# Patient Record
Sex: Female | Born: 1945 | Race: White | Hispanic: No | Marital: Married | Smoking: Former smoker
Health system: Southern US, Community
[De-identification: ages and names within clinical notes are randomized; demographics above are authoritative.]

## PROBLEM LIST (undated history)

## (undated) DIAGNOSIS — K219 Gastro-esophageal reflux disease without esophagitis: Secondary | ICD-10-CM

## (undated) DIAGNOSIS — F329 Major depressive disorder, single episode, unspecified: Secondary | ICD-10-CM

## (undated) DIAGNOSIS — F32A Depression, unspecified: Secondary | ICD-10-CM

## (undated) DIAGNOSIS — L280 Lichen simplex chronicus: Secondary | ICD-10-CM

## (undated) DIAGNOSIS — I495 Sick sinus syndrome: Secondary | ICD-10-CM

## (undated) DIAGNOSIS — J439 Emphysema, unspecified: Secondary | ICD-10-CM

## (undated) DIAGNOSIS — I080 Rheumatic disorders of both mitral and aortic valves: Secondary | ICD-10-CM

## (undated) DIAGNOSIS — I10 Essential (primary) hypertension: Secondary | ICD-10-CM

## (undated) DIAGNOSIS — R55 Syncope and collapse: Secondary | ICD-10-CM

## (undated) DIAGNOSIS — G8929 Other chronic pain: Secondary | ICD-10-CM

## (undated) DIAGNOSIS — K279 Peptic ulcer, site unspecified, unspecified as acute or chronic, without hemorrhage or perforation: Secondary | ICD-10-CM

## (undated) HISTORY — PX: CARPAL TUNNEL RELEASE: SHX101

## (undated) HISTORY — DX: Sick sinus syndrome: I49.5

## (undated) HISTORY — DX: Syncope and collapse: R55

## (undated) HISTORY — DX: Depression, unspecified: F32.A

## (undated) HISTORY — DX: Gastro-esophageal reflux disease without esophagitis: K21.9

## (undated) HISTORY — DX: Rheumatic disorders of both mitral and aortic valves: I08.0

## (undated) HISTORY — DX: Major depressive disorder, single episode, unspecified: F32.9

## (undated) HISTORY — DX: Lichen simplex chronicus: L28.0

## (undated) HISTORY — DX: Other chronic pain: G89.29

## (undated) HISTORY — DX: Emphysema, unspecified: J43.9

## (undated) HISTORY — PX: SHOULDER SURGERY: SHX246

## (undated) HISTORY — PX: APPENDECTOMY: SHX54

## (undated) HISTORY — PX: PACEMAKER INSERTION: SHX728

## (undated) HISTORY — PX: CHOLECYSTECTOMY: SHX55

## (undated) HISTORY — PX: NISSEN FUNDOPLICATION: SHX2091

## (undated) HISTORY — PX: SPLENECTOMY: SUR1306

## (undated) HISTORY — DX: Peptic ulcer, site unspecified, unspecified as acute or chronic, without hemorrhage or perforation: K27.9

---

## 2004-02-18 ENCOUNTER — Emergency Department (HOSPITAL_COMMUNITY): Admission: EM | Admit: 2004-02-18 | Discharge: 2004-02-18 | Payer: Self-pay | Admitting: Emergency Medicine

## 2004-05-12 ENCOUNTER — Ambulatory Visit: Payer: Self-pay | Admitting: Psychiatry

## 2005-03-03 ENCOUNTER — Ambulatory Visit: Payer: Self-pay | Admitting: Psychiatry

## 2005-03-03 ENCOUNTER — Inpatient Hospital Stay (HOSPITAL_COMMUNITY): Admission: RE | Admit: 2005-03-03 | Discharge: 2005-03-09 | Payer: Self-pay | Admitting: Psychiatry

## 2008-09-20 ENCOUNTER — Encounter: Payer: Self-pay | Admitting: Cardiology

## 2008-12-07 ENCOUNTER — Encounter: Payer: Self-pay | Admitting: Cardiology

## 2009-01-21 ENCOUNTER — Encounter: Payer: Self-pay | Admitting: Cardiology

## 2009-01-22 ENCOUNTER — Ambulatory Visit: Payer: Self-pay | Admitting: Cardiology

## 2009-01-23 ENCOUNTER — Encounter: Payer: Self-pay | Admitting: Cardiology

## 2009-01-31 ENCOUNTER — Telehealth: Payer: Self-pay | Admitting: Cardiology

## 2009-02-07 ENCOUNTER — Telehealth (INDEPENDENT_AMBULATORY_CARE_PROVIDER_SITE_OTHER): Payer: Self-pay | Admitting: Physician Assistant

## 2009-02-25 ENCOUNTER — Ambulatory Visit: Payer: Self-pay | Admitting: Cardiology

## 2009-02-26 ENCOUNTER — Encounter: Payer: Self-pay | Admitting: Cardiology

## 2009-02-28 ENCOUNTER — Ambulatory Visit: Payer: Self-pay | Admitting: Cardiology

## 2009-03-19 ENCOUNTER — Ambulatory Visit: Payer: Self-pay | Admitting: Internal Medicine

## 2009-03-19 DIAGNOSIS — I495 Sick sinus syndrome: Secondary | ICD-10-CM

## 2009-03-19 DIAGNOSIS — R634 Abnormal weight loss: Secondary | ICD-10-CM

## 2009-03-28 ENCOUNTER — Encounter: Payer: Self-pay | Admitting: Internal Medicine

## 2009-03-28 ENCOUNTER — Ambulatory Visit: Payer: Self-pay

## 2009-04-03 ENCOUNTER — Inpatient Hospital Stay (HOSPITAL_COMMUNITY): Admission: RE | Admit: 2009-04-03 | Discharge: 2009-04-04 | Payer: Self-pay | Admitting: Internal Medicine

## 2009-04-03 ENCOUNTER — Ambulatory Visit: Payer: Self-pay | Admitting: Internal Medicine

## 2009-04-03 LAB — CONVERTED CEMR LAB
Basophils Absolute: 0.1 10*3/uL (ref 0.0–0.1)
CO2: 32 meq/L (ref 19–32)
Calcium: 8.7 mg/dL (ref 8.4–10.5)
Creatinine, Ser: 1 mg/dL (ref 0.4–1.2)
Eosinophils Absolute: 0.3 10*3/uL (ref 0.0–0.7)
Glucose, Bld: 102 mg/dL — ABNORMAL HIGH (ref 70–99)
Hemoglobin: 10.5 g/dL — ABNORMAL LOW (ref 12.0–15.0)
Lymphocytes Relative: 34.6 % (ref 12.0–46.0)
Lymphs Abs: 2.2 10*3/uL (ref 0.7–4.0)
MCHC: 33.3 g/dL (ref 30.0–36.0)
Neutro Abs: 2.6 10*3/uL (ref 1.4–7.7)
RDW: 13.5 % (ref 11.5–14.6)
Sodium: 138 meq/L (ref 135–145)
aPTT: 28.8 s (ref 21.7–28.8)

## 2009-04-04 ENCOUNTER — Encounter: Payer: Self-pay | Admitting: Internal Medicine

## 2009-04-18 ENCOUNTER — Ambulatory Visit: Payer: Self-pay | Admitting: Cardiology

## 2009-04-18 DIAGNOSIS — F17200 Nicotine dependence, unspecified, uncomplicated: Secondary | ICD-10-CM

## 2009-04-18 DIAGNOSIS — I38 Endocarditis, valve unspecified: Secondary | ICD-10-CM | POA: Insufficient documentation

## 2009-04-22 ENCOUNTER — Encounter: Payer: Self-pay | Admitting: Internal Medicine

## 2009-04-22 ENCOUNTER — Ambulatory Visit: Payer: Self-pay

## 2009-04-30 ENCOUNTER — Telehealth: Payer: Self-pay | Admitting: Internal Medicine

## 2009-05-02 ENCOUNTER — Encounter: Payer: Self-pay | Admitting: Internal Medicine

## 2009-07-05 ENCOUNTER — Ambulatory Visit: Payer: Self-pay | Admitting: Internal Medicine

## 2010-05-22 ENCOUNTER — Ambulatory Visit: Payer: Self-pay | Admitting: Internal Medicine

## 2010-06-23 ENCOUNTER — Telehealth (INDEPENDENT_AMBULATORY_CARE_PROVIDER_SITE_OTHER): Payer: Self-pay | Admitting: *Deleted

## 2010-06-27 ENCOUNTER — Ambulatory Visit: Payer: Self-pay | Admitting: Cardiology

## 2010-08-21 ENCOUNTER — Encounter: Payer: Self-pay | Admitting: Internal Medicine

## 2010-08-21 ENCOUNTER — Ambulatory Visit
Admission: RE | Admit: 2010-08-21 | Discharge: 2010-08-21 | Payer: Self-pay | Source: Home / Self Care | Attending: Internal Medicine | Admitting: Internal Medicine

## 2010-08-28 NOTE — Assessment & Plan Note (Signed)
Summary: ROV   Visit Type:  Pacemaker check Referring Lestat Golob:  Ival Bible Primary Jeaneen Cala:  Hasanaj   History of Present Illness: The patient presents today for routine electrophysiology followup. She reports doing very well since last being seen in our clinic.  She has had no further syncope and feels that her fatigue has improved s/p pacemaker.  She reports occasional chest tightness and wheezing but continues to smoke.  She also reports occasional sharp fleeting chest pains over her lateral chest wall. The patient denies symptoms of palpitations,  shortness of breath, orthopnea, PND, lower extremity edema, dizziness, presyncope, syncope, or neurologic sequela. The patient is tolerating medications without difficulties and is otherwise without complaint today.   Preventive Screening-Counseling & Management  Alcohol-Tobacco     Smoking Status: current     Smoking Cessation Counseling: yes     Packs/Day: <1/4 PPD  Current Medications (verified): 1)  Xanax 1 Mg Tabs (Alprazolam) .... Three Times A Day 2)  Ranitidine Hcl 300 Mg Caps (Ranitidine Hcl) .... Once Daily 3)  Omeprazole 20 Mg Cpdr (Omeprazole) .... Take 1 Capsule By Mouth Two Times A Day 4)  Morphine Sulfate 15 Mg Tabs (Morphine Sulfate) .... Take 1 Tablet By Mouth Three Times A Day 5)  Promethazine Hcl 25 Mg Tabs (Promethazine Hcl) .... By Mouth As Needed 6)  Zolpidem Tartrate 5 Mg Tabs (Zolpidem Tartrate) .... Take 1 Tab By Mouth At Bedtime  Allergies (verified): No Known Drug Allergies  Comments:  Nurse/Medical Assistant: The patient's medication bottles and allergies were reviewed with the patient and were updated in the Medication and Allergy Lists.  Past History:  Past Medical History: Reviewed history from 03/19/2009 and no changes required. 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    Ischemic testing via Cardiolite was reassuring with ejection fraction   53%, normal wall motion, and no ischemic defects.  Past Surgical History: Reviewed history from 03/19/2009 and no changes required. cholecystectomy GE reflux surgery? Nissen fundoplication Carpal tunnel surgery Shoulder surgery Appendectomy splenectomy following trauma  Social History: Reviewed history from 03/14/2009 and no changes required. Married-has one daughter Tobacco Use - Yes. 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   Vital Signs:  Patient profile:   65 year old female Height:      62 inches Weight:      109 pounds BMI:     20.01 Pulse rate:   85 / minute BP sitting:   101 / 74  (left arm) Cuff size:   regular  Vitals Entered By: Carlye Grippe (May 22, 2010 2:32 PM)  Physical Exam  General:  thin female, appears older than her stated age Head:  normocephalic and atraumatic Eyes:  PERRLA/EOM intact; conjunctiva and lids normal. Mouth:  Teeth, gums and palate normal. Oral mucosa normal. Neck:  supple, no bruits Chest Wall:  pacemaker site is well healed sharp pain with palpation over axillary region and  also at sternochostral borders along L side which reproduces her pain Lungs:  diffuse expiratory wheezes Heart:  RRR, no m/r/g Abdomen:  Bowel sounds positive; abdomen soft and non-tender without masses, organomegaly, or hernias noted. No  hepatosplenomegaly. Msk:  Back normal, normal gait. Muscle strength and tone normal. Extremities:  No clubbing or cyanosis. Neurologic:  Alert and oriented x 3.   PPM Specifications Following MD:  Hillis Range, MD     Referring MD:  MCDOWELL PPM Vendor:  [employer]     PPM Model Number:  [id number]     PPM Serial Number:  [id number] PPM DOI:  04/03/2009     PPM Implanting MD:  Sherryl Manges, MD  Lead 1    Location: RA     DOI: 04/03/2009     Model #: 1944     Serial #: [id number]     Status: active Lead 2    Location: RV     DOI: 04/03/2009     Model #: 1948     Serial #: [id number]     Status: active  Magnet Response Rate:  BOL  100 ERI  85    PPM Follow Up Battery Voltage:  2.98 V     Battery Est. Longevity:  10.7 yrs     Pacer Dependent:  No       PPM Device Measurements Atrium  Amplitude: 5.0 mV, Impedance: 550 ohms, Threshold: 0.5 V at 0.5 msec Right Ventricle  Amplitude: 12.0 mV, Impedance: 660 ohms, Threshold: 0.5 V at 0.4 msec  Episodes MS Episodes:  8     Percent Mode Switch:  <1%     Ventricular High Rate:  0     Atrial Pacing:  56%     Ventricular Pacing:  2.5%  Parameters Mode:  DDDR     Lower Rate Limit:  60     Upper Rate Limit:  120 Paced AV Delay:  200     Sensed AV Delay:  200 Next Remote Date:  08/21/2010     Next Cardiology Appt Due:  04/27/2011 Tech Comments:  8 AMS EPISDOES--LONGEST WAS 8 SECONDS.  NORMAL DEVICE FUNCTION. NO CHANGES MADE. PT TO BE ENROLLED IN MERLIN. [facility]LIN TRANSMISSION 08-21-10 AND ROV IN 12 MTHS W/JA. Vella Kohler  May 22, 2010 3:04 PM MD Comments:  agree  Impression & Recommendations:  Problem # 1:  SINUS BRADYCARDIA (ICD-427.81) normal pacemaker function syncope resolved as above  Problem # 2:  TOBACCO ABUSE (ICD-305.1) cessation advised  Patient Instructions: 1)  merlin phone checks every 3 months 2)  return in 12 months

## 2010-08-28 NOTE — Cardiovascular Report (Signed)
Summary: Card Device Clinic/ [facility] SUMMARY  Card Device Clinic/ [facility] SUMMARY   Imported By: Dorise Hiss 05/23/2010 12:18:00  _____________________________________________________________________  External Attachment:    Type:   Image     Comment:   External Document
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
## 2010-08-28 NOTE — Progress Notes (Signed)
Summary: Home Device Checks   Phone Note Call from Patient Call back at Home Phone [phone]   Summary of Call: Pt left message on stating that she needs someone to call her back about transmitter that is hooked up to phone. Returned call and left message with female to have pt return call.  Initial call taken by: Cyril Loosen, RN, BSN,  June 23, 2010 4:53 PM  Follow-up for Phone Call        Pt has "transmitter" to check her pacemaker on the phone. She would like to know when she is suppose to do this. Pt notified per IDX schedule she is due on 1/26 for phone check. A message will be sent to [facility] in [place] to notify pt if this is incorrect.  Follow-up by: Cyril Loosen, RN, BSN,  June 24, 2010 12:16 PM

## 2010-09-03 ENCOUNTER — Encounter (INDEPENDENT_AMBULATORY_CARE_PROVIDER_SITE_OTHER): Payer: Self-pay | Admitting: *Deleted

## 2010-09-11 NOTE — Cardiovascular Report (Signed)
Summary: Office Visit Remote   Office Visit Remote   Imported By: Roderic Ovens 09/04/2010 15:34:01  _____________________________________________________________________  External Attachment:    Type:   Image     Comment:   External Document

## 2010-09-11 NOTE — Letter (Signed)
Summary: Remote Device Check  [facility], Main Office  [address]   [place], [place] [address]   Phone: [phone]  Fax: [phone]     September 03, 2010 MRN: [id number]   LORELL THIBODAUX [address] [place], [place]  [address]   Dear Ms. Sarabia,   Your remote transmission was recieved and reviewed by your physician.  All diagnostics were within normal limits for you.  __X___Your next transmission is scheduled for:  11-20-2010.  Please transmit at any time this day.  If you have a wireless device your transmission will be sent automatically.   Sincerely,  Vella Kohler

## 2010-11-20 ENCOUNTER — Other Ambulatory Visit: Payer: Self-pay | Admitting: Internal Medicine

## 2010-11-20 ENCOUNTER — Ambulatory Visit (INDEPENDENT_AMBULATORY_CARE_PROVIDER_SITE_OTHER): Payer: Medicare PPO | Admitting: *Deleted

## 2010-11-20 DIAGNOSIS — I495 Sick sinus syndrome: Secondary | ICD-10-CM

## 2010-11-30 NOTE — Progress Notes (Signed)
Pacer remote check  

## 2010-12-03 ENCOUNTER — Encounter: Payer: Self-pay | Admitting: *Deleted
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
## 2010-12-12 NOTE — Discharge Summary (Signed)
NAMEWILL, SCHIER                ACCOUNT NO.:  [id number]   MEDICAL RECORD NO.:  [id number]          PATIENT TYPE:  IPS   LOCATION:  0406                          FACILITY:  [facility]   PHYSICIAN:  Anselm Jungling, MD  DATE OF BIRTH:  02/23/1946   DATE OF ADMISSION:  03/03/2005  DATE OF DISCHARGE:  03/09/2005                                 DISCHARGE SUMMARY   IDENTIFYING DATA AND REASON FOR ADMISSION:  This was the first [facility] admission  for [name], a 65 year old female who was admitted for increasing depression  and suicidal ideation.  Most notably, she had a long history of skin  picking/neurodermatitis.  She was admitted on large doses of Valium,  approximately 10 mg t.i.d.  Medications at the time of admission included  the following:  Effexor XR 75 mg daily, Mobic 15 mg daily, Valium 10 mg  t.i.d., trazodone 50 mg q.h.s., Promethazine 25 mg q.i.d. p.r.n., Skelaxin  800 mg q.i.d. p.r.n., Allegra D 1 b.i.d. p.r.n., quinine sulfate 325 mg  q.h.s., Buspirone 30 mg b.i.d., Sucralfate 1 gram p.o. q.i.d. p.r.n., Maxalt  10 mg daily p.r.n., morphine sulfate 15 mg q.6 hours p.r.n., and Temazepam  30 mg q.h.s. p.r.n.  Please refer to the admission note for further details  pertaining to the symptoms, circumstances, and history that led to his  hospitalization at [facility].   INITIAL DIAGNOSTIC IMPRESSION:  AXIS I:  Depressive disorder not otherwise  specified., rule out psychosis, rule out neurodermatitis, rule out anxiety  disorder not otherwise specified, and benzodiazepine dependence.  AXIS II:  No diagnosis.  AXIS III:  Cellulitis left leg.  AXIS IV:  Stressors, severe.  AXIS V:  Global assessment of function on admission 25.   MEDICAL AND LABORATORY:  The patient presented very ill appearing, with  multiple dressed skin wounds including one on her left anterior lateral leg  with distal swelling of the foot accompanied by significant pain.  She had  been scratching extensively around these  areas.  An internal medicine  consultation was obtained and they provided guidance regarding wound  management throughout her stay.  Also a nutrition consultation was obtained.  Laboratory studies included a CBC that showed reduced RBCs of 3.28, reduced  hemoglobin at 10.6, reduced hematocrit at 31.2, and a routine chemistry  panel that was within normal limits.  TSH was within normal limits as well.  A pregnancy test was negative.  A toxicology screen was negative, although  nordiazepam, oxazepam, and Temazepam were found, along with opiates,  consistent with the medications she had been taking prior to admission.  A  urinalysis was within normal limits.   HOSPITAL COURSE:  The patient was presented as an ill-appearing, nearly  cachectic woman, with multiple dressed skin wounds as above.  She did not  display any overt signs or symptoms of psychosis, but it was felt that there  might be underlying psychotic basis to her overall picture.  Her mood was  depressed and anxious.  Her thoughts were generally well-organized.  She was  generally a poor participant in the treatment program, preferring  to stay in  bed, and being very depressed.   She was placed on a Librium protocol to address her benzodiazepine  dependence.  Librium was tapered over the course of her inpatient stay.   Librium did not appear to be satisfactory, so Valium 5 mg t.i.d. and h.s.  was instituted on a tapering schedule.  Effexor XR was increased to 75 mg  b.i.d.  Risperdal 0.25 mg q.h.s. was initiated in hopes that it would reduce  her tendency to itch and scratch.  Prozac 20 mg daily was instituted in hope  that it may address an obsessive-compulsive quality to her neurodermatitis.  Risperdal was subsequently increased to 0.25 mg b.i.d. and 0.5 mg h.s.  This  was well tolerated.  Keflex 500 mg b.i.d. was given to address skin  infection.  Risperdal was later increased to 0.5 mg b.i.d. and 1 mg q.h.s.  BuSpar 30 mg  b.i.d. was continued.  The patient was given Ensure briefly,  until her appetite improved to the point that she was taking food more  adequately.  Prozac was increased to 40 mg daily.   AFTERCARE PLANS:  At the time of discharge, the patient continued depressed,  but she was much less involved in scratching her skin lesions.  She was  absent suicidal ideation.  [facility]  She was also to follow up with Dr.  Doyne Keel for wound and skin care.   The patient was given instructions for wound care in very specific terms.   DISCHARGE MEDICATIONS:  Trazodone 50 mg q.h.s., Valium 5 mg q.i.d.,  Risperdal 0.5 b.i.d. and 1 mg q.h.s., BuSpar 30 mg b.i.d., Prozac 40 mg  daily, Keflex 500 mg 1 b.i.d. until gone, Mobic 15 mg daily, quinine sulfate  325 mg daily p.r.n. cramping, resume pain medications via Dr. Doyne Keel, and  Benadryl 50 mg q.6 hours p.r.n. itching.   DISCHARGE DIAGNOSES:  AXIS I:  Major depressive disorder, and  neurodermatitis.  AXIS II:  Deferred.  AXIS III:  Multiple skin lesions/wounds,  AXIS IV:  Stressors, severe.  AXIS V:  Global assessment of function on discharge 55.           ______________________________  Anselm Jungling, MD  Electronically Signed     SPB/MEDQ  D:  03/27/2005  T:  03/27/2005  Job:  [id number]
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
## 2011-02-19 ENCOUNTER — Ambulatory Visit (INDEPENDENT_AMBULATORY_CARE_PROVIDER_SITE_OTHER): Payer: Medicare PPO | Admitting: *Deleted

## 2011-02-19 DIAGNOSIS — I498 Other specified cardiac arrhythmias: Secondary | ICD-10-CM

## 2011-02-20 ENCOUNTER — Other Ambulatory Visit: Payer: Self-pay

## 2011-02-20 LAB — REMOTE PACEMAKER DEVICE
AL THRESHOLD: 0.5 V
ATRIAL PACING PM: 57
BAMS-0001: 150 {beats}/min
BAMS-0003: 70 {beats}/min
DEVICE MODEL PM: 2300232
RV LEAD IMPEDENCE PM: 610 Ohm
RV LEAD THRESHOLD: 0.5 V

## 2011-02-24 NOTE — Progress Notes (Signed)
Pacer remote check  

## 2011-03-10 ENCOUNTER — Encounter: Payer: Self-pay | Admitting: *Deleted

## 2011-05-05 ENCOUNTER — Encounter: Payer: Self-pay | Admitting: Internal Medicine

## 2011-05-22 ENCOUNTER — Ambulatory Visit (INDEPENDENT_AMBULATORY_CARE_PROVIDER_SITE_OTHER): Payer: Medicare PPO | Admitting: Internal Medicine

## 2011-05-22 ENCOUNTER — Encounter: Payer: Self-pay | Admitting: Internal Medicine

## 2011-05-22 DIAGNOSIS — I495 Sick sinus syndrome: Secondary | ICD-10-CM

## 2011-05-22 LAB — PACEMAKER DEVICE OBSERVATION
AL AMPLITUDE: 5 mv
AL IMPEDENCE PM: 512.5 Ohm
AL THRESHOLD: 0.5 v
ATRIAL PACING PM: 54
BAMS-0001: 150 {beats}/min
BAMS-0003: 70 {beats}/min
BATTERY VOLTAGE: 2.9779 v
DEVICE MODEL PM: 2300232
RV LEAD AMPLITUDE: 8.6 mv
RV LEAD IMPEDENCE PM: 587.5 Ohm
RV LEAD THRESHOLD: 0.5 v
VENTRICULAR PACING PM: 2.5

## 2011-05-22 NOTE — Progress Notes (Signed)
The patient presents today for routine electrophysiology followup.  Since last being seen in our clinic, the patient reports doing very well.  She has chronic stable chest wall pain.  She fell last week in the bathtub and injured her L wrist.  She denies associated syncope. Today, she denies symptoms of palpitations, exertional chest pain, shortness of breath, orthopnea, PND, lower extremity edema, dizziness, presyncope, syncope, or neurologic sequela.  The patient feels that she is tolerating medications without difficulties and is otherwise without complaint today.   Past Medical History  Diagnosis Date  . Sick sinus syndrome     s/p PPM (SJM)  . Neurodermatitis   . Syncope     history of   . COPD with emphysema   . Depression   . GERD (gastroesophageal reflux disease)   . Peptic ulcer   . Mitral and aortic regurgitation     mild  . Chronic pain    Past Surgical History  Procedure Date  . Cholecystectomy   . Genella Rife   . Other surgical history     gerd surgery, Nissen fundoplication  . Carpal tunnel release   . Shoulder surgery   . Appendectomy   . Splenectomy     following trauma  . Pacemaker insertion     St Jude    Current Outpatient Prescriptions  Medication Sig Dispense Refill  . ALPRAZolam (XANAX) 1 MG tablet Take 1 mg by mouth 3 (three) times daily.        Marland Kitchen gabapentin (NEURONTIN) 300 MG capsule Take 1 capsule by mouth Three times a day.      . morphine (MSIR) 15 MG tablet Take 15 mg by mouth 3 (three) times daily.        Marland Kitchen omeprazole (PRILOSEC) 20 MG capsule Take 1 capsule by mouth Twice daily.      . promethazine (PHENERGAN) 25 MG tablet Take 25 mg by mouth every 6 (six) hours as needed.        . ranitidine (ZANTAC) 300 MG tablet Take 1 tablet by mouth Daily.      Marland Kitchen zolpidem (AMBIEN) 5 MG tablet Take 5 mg by mouth at bedtime as needed.        Marland Kitchen HYDROcodone-acetaminophen (LORTAB) 7.5-500 MG/15ML solution Take 1 tablet by mouth as needed.        No Known  Allergies  History   Social History  . Marital Status: Married    Spouse Name: N/A    Number of Children: 1  . Years of Education: N/A   Occupational History  . Not on file.   Social History Main Topics  . Smoking status: Current Everyday Smoker -- 2.0 packs/day for 40 years    Types: Cigarettes  . Smokeless tobacco: Never Used  . Alcohol Use: No     former heavy drinker  . Drug Use: No     form benodiazepine dependence  . Sexually Active: Not on file   Other Topics Concern  . Not on file   Social History Narrative  . No narrative on file   Physical Exam: Filed Vitals:   05/22/11 1453  BP: 120/73  Pulse: 76  Height: 5\' 2"  (1.575 m)  Weight: 133 lb (60.328 kg)    GEN- The patient is well appearing, alert and oriented x 3 today.   Head- normocephalic, atraumatic Eyes-  Sclera clear, conjunctiva pink Ears- hearing intact Oropharynx- clear Neck- supple, no JVP Lymph- no cervical lymphadenopathy Lungs- Clear to ausculation bilaterally, normal work of  breathing Chest- pacemaker pocket is well healed Heart- Regular rate and rhythm, no murmurs, rubs or gallops, PMI not laterally displaced GI- soft, NT, ND, + BS Extremities- no clubbing, cyanosis, or edema MS- L arm in a cast Skin- no rash or lesion Psych- euthymic mood, full affect Neuro- strength and sensation are intact  Pacemaker interrogation- reviewed in detail today,  See PACEART report  Assessment and Plan:

## 2011-05-22 NOTE — Assessment & Plan Note (Signed)
Normal pacemaker function See Pace Art report No changes today  

## 2011-06-29 ENCOUNTER — Encounter: Payer: Self-pay | Admitting: Cardiology

## 2011-06-30 ENCOUNTER — Encounter: Payer: Self-pay | Admitting: Cardiology

## 2011-06-30 ENCOUNTER — Ambulatory Visit (INDEPENDENT_AMBULATORY_CARE_PROVIDER_SITE_OTHER): Payer: Medicare PPO | Admitting: Cardiology

## 2011-06-30 DIAGNOSIS — I495 Sick sinus syndrome: Secondary | ICD-10-CM

## 2011-06-30 DIAGNOSIS — I359 Nonrheumatic aortic valve disorder, unspecified: Secondary | ICD-10-CM

## 2011-06-30 DIAGNOSIS — I059 Rheumatic mitral valve disease, unspecified: Secondary | ICD-10-CM

## 2011-06-30 NOTE — Progress Notes (Signed)
Clinical Summary [name] is a 65 y.o.female presenting for a one-year visit. She has had interval follow up with Dr. Johney Frame for pacemaker evaluation and history of sick sinus syndrome.  She reports no palpitations, dizziness, or syncope. Has had chronic problems with atypical chest pain and other chronic pain symptoms. No change in intensity or frequency.  Also history of aortic and mitral regurgitation. Reports NYHA class II dyspnea on exertion.   No Known Allergies  Medication list reviewed.  Past Medical History  Diagnosis Date  . Sick sinus syndrome     s/p PPM (SJM)  . Neurodermatitis   . Syncope   . COPD with emphysema   . Depression   . GERD (gastroesophageal reflux disease)   . Peptic ulcer   . Mitral and aortic regurgitation     Mild  . Chronic pain     Past Surgical History  Procedure Date  . Cholecystectomy   . Nissen fundoplication   . Carpal tunnel release   . Shoulder surgery   . Appendectomy   . Splenectomy   . Pacemaker insertion     St Jude    Social History [name] reports that she has been smoking Cigarettes.  She has a 80 pack-year smoking history. She has never used smokeless tobacco. [name] reports that she does not drink alcohol.  Review of Systems Otherwise negative except as outlined.  Physical Examination Filed Vitals:   06/30/11 1434  BP: 120/72  Pulse: 80  Resp: 16   Thin woman in no acute distress.  HEENT: Conjunctiva and lids normal, oropharynx with moist mucosa.  Neck: Supple, no elevated JVP or carotid bruits.  Chest wall: Stable device pocket site on the left.  Cardiac: Regular rate and rhythm, no S3 gallop, is soft apical systolic murmur.  Lungs: Diminished breath sounds throughout, nonlabored, no wheezing.  Extremities: No pitting edema.   ECG Sinus rhythm at 66.   Problem List and Plan

## 2011-06-30 NOTE — Assessment & Plan Note (Signed)
History of aortic and mitral regurgitation, symptomatically stable. Followup 2-D echocardiogram is arranged for objective evaluation.

## 2011-06-30 NOTE — Patient Instructions (Signed)
   Echo  If the results of your test are normal or stable, you will receive a letter.  If they are abnormal, the nurse will contact you by phone. Your physician wants you to follow up in:  1 year.  You will receive a reminder letter in the mail one-two months in advance.  If you don't receive a letter, please call our office to schedule the follow up appointment

## 2011-06-30 NOTE — Assessment & Plan Note (Signed)
Status post pacemaker placement, followed by Dr. Allred. 

## 2011-07-16 ENCOUNTER — Other Ambulatory Visit (INDEPENDENT_AMBULATORY_CARE_PROVIDER_SITE_OTHER): Payer: Medicare PPO | Admitting: *Deleted

## 2011-07-16 DIAGNOSIS — I059 Rheumatic mitral valve disease, unspecified: Secondary | ICD-10-CM

## 2011-07-16 DIAGNOSIS — I359 Nonrheumatic aortic valve disorder, unspecified: Secondary | ICD-10-CM

## 2011-08-20 ENCOUNTER — Encounter: Payer: Medicare PPO | Admitting: *Deleted

## 2011-08-24 ENCOUNTER — Encounter: Payer: Self-pay | Admitting: *Deleted

## 2011-09-25 ENCOUNTER — Telehealth: Payer: Self-pay | Admitting: Internal Medicine

## 2011-09-25 NOTE — Telephone Encounter (Signed)
Pt not in at this time. Will call back on Monday.

## 2011-10-07 NOTE — Telephone Encounter (Signed)
Pt missed remote transmission 08-20-11. Pt to send transmission today and instructed pt that letter would be sent to her letting her know when next transmission date will be scheduled. Pt understands and will send transmission today.

## 2011-10-08 ENCOUNTER — Encounter: Payer: Self-pay | Admitting: Internal Medicine

## 2011-10-08 ENCOUNTER — Encounter (INDEPENDENT_AMBULATORY_CARE_PROVIDER_SITE_OTHER): Payer: Medicare PPO

## 2011-10-08 DIAGNOSIS — I498 Other specified cardiac arrhythmias: Secondary | ICD-10-CM

## 2011-10-09 LAB — REMOTE PACEMAKER DEVICE
BAMS-0001: 150 {beats}/min
BAMS-0003: 70 {beats}/min
BATTERY VOLTAGE: 2.96 V
RV LEAD AMPLITUDE: 10 mv

## 2011-10-13 ENCOUNTER — Encounter: Payer: Self-pay | Admitting: *Deleted

## 2012-01-07 ENCOUNTER — Encounter: Payer: Self-pay | Admitting: Internal Medicine

## 2012-01-07 ENCOUNTER — Ambulatory Visit (INDEPENDENT_AMBULATORY_CARE_PROVIDER_SITE_OTHER): Payer: Medicare Other | Admitting: *Deleted

## 2012-01-07 DIAGNOSIS — I495 Sick sinus syndrome: Secondary | ICD-10-CM

## 2012-01-07 LAB — REMOTE PACEMAKER DEVICE
AL AMPLITUDE: 5 mv
AL IMPEDENCE PM: 490 Ohm
BATTERY VOLTAGE: 2.96 V
RV LEAD IMPEDENCE PM: 680 Ohm
RV LEAD THRESHOLD: 0.625 V
VENTRICULAR PACING PM: 1.4

## 2012-01-19 ENCOUNTER — Encounter: Payer: Self-pay | Admitting: *Deleted

## 2012-04-27 ENCOUNTER — Encounter: Payer: Self-pay | Admitting: *Deleted

## 2012-04-27 DIAGNOSIS — Z950 Presence of cardiac pacemaker: Secondary | ICD-10-CM | POA: Insufficient documentation

## 2012-05-04 ENCOUNTER — Encounter: Payer: Self-pay | Admitting: Internal Medicine

## 2012-05-04 ENCOUNTER — Ambulatory Visit (INDEPENDENT_AMBULATORY_CARE_PROVIDER_SITE_OTHER): Payer: Medicare Other | Admitting: Internal Medicine

## 2012-05-04 VITALS — BP 103/59 | HR 81 | Ht 62.0 in | Wt 132.0 lb

## 2012-05-04 DIAGNOSIS — Z950 Presence of cardiac pacemaker: Secondary | ICD-10-CM

## 2012-05-04 DIAGNOSIS — I495 Sick sinus syndrome: Secondary | ICD-10-CM

## 2012-05-04 DIAGNOSIS — F17200 Nicotine dependence, unspecified, uncomplicated: Secondary | ICD-10-CM

## 2012-05-04 LAB — PACEMAKER DEVICE OBSERVATION
ATRIAL PACING PM: 60
BAMS-0001: 150 {beats}/min
BAMS-0003: 70 {beats}/min
DEVICE MODEL PM: 2300232
RV LEAD THRESHOLD: 0.625 V
VENTRICULAR PACING PM: 2.3

## 2012-05-04 NOTE — Progress Notes (Signed)
PCP: Toma Deiters, MD Primary Cardiologist:  Dr Diona Browner  The patient presents today for routine electrophysiology followup.  Since last being seen in our clinic, the patient reports doing very well.  She has chronic stable chest wall pain which is sharp and under her left breast.  She does not feel that this has changed since her last visit.  Today, she denies symptoms of palpitations, exertional chest pain, shortness of breath, orthopnea, PND, lower extremity edema, dizziness, presyncope, syncope, or neurologic sequela.  The patient feels that she is tolerating medications without difficulties and is otherwise without complaint today.   Past Medical History  Diagnosis Date  . Sick sinus syndrome     s/p PPM (SJM)  . Neurodermatitis   . Syncope   . COPD with emphysema   . Depression   . GERD (gastroesophageal reflux disease)   . Peptic ulcer   . Mitral and aortic regurgitation     Mild  . Chronic pain    Past Surgical History  Procedure Date  . Cholecystectomy   . Nissen fundoplication   . Carpal tunnel release   . Shoulder surgery   . Appendectomy   . Splenectomy   . Pacemaker insertion     St Jude    Current Outpatient Prescriptions  Medication Sig Dispense Refill  . ALPRAZolam (XANAX) 1 MG tablet Take 1 mg by mouth 3 (three) times daily.        Marland Kitchen gabapentin (NEURONTIN) 300 MG capsule Take 1 capsule by mouth Three times a day.      . morphine (MSIR) 15 MG tablet Take 15 mg by mouth 3 (three) times daily.        Marland Kitchen omeprazole (PRILOSEC) 20 MG capsule Take 1 capsule by mouth Twice daily.      . promethazine (PHENERGAN) 25 MG tablet Take 25 mg by mouth every 6 (six) hours as needed.        . ranitidine (ZANTAC) 300 MG tablet Take 1 tablet by mouth Daily.      Marland Kitchen tiZANidine (ZANAFLEX) 4 MG tablet Take 4 mg by mouth 2 (two) times daily.      Marland Kitchen zolpidem (AMBIEN) 5 MG tablet Take 5 mg by mouth at bedtime as needed.          No Known Allergies  History   Social History  .  Marital Status: Married    Spouse Name: N/A    Number of Children: 1  . Years of Education: N/A   Occupational History  . Not on file.   Social History Main Topics  . Smoking status: Current Every Day Smoker -- 2.0 packs/day for 40 years    Types: Cigarettes  . Smokeless tobacco: Never Used   Comment: I have encouraged cessation today, she is not ready to quit  . Alcohol Use: No     Former heavy drinker  . Drug Use: No     Former benodiazepine dependence  . Sexually Active: Not on file   Other Topics Concern  . Not on file   Social History Narrative  . No narrative on file   Physical Exam: Filed Vitals:   05/04/12 1051  BP: 103/59  Pulse: 81  Height: 5\' 2"  (1.575 m)  Weight: 132 lb (59.875 kg)    GEN- The patient is well appearing, alert and oriented x 3 today.   Head- normocephalic, atraumatic Eyes-  Sclera clear, conjunctiva pink Ears- hearing intact Oropharynx- clear Neck- supple, no JVP Lymph- no cervical lymphadenopathy  Lungs- Clear to ausculation bilaterally, normal work of breathing Chest- pacemaker pocket is well healed Heart- Regular rate and rhythm, no murmurs, rubs or gallops, PMI not laterally displaced GI- soft, NT, ND, + BS Extremities- no clubbing, cyanosis, or edema  Pacemaker interrogation- reviewed in detail today,  See PACEART report  Assessment and Plan:  1. Bradycardia Normal pacemaker function See Pace Art report No changes today  2. Atypical chest pain Unchanged  3. Tobacco  Cessation strongly advised She is not ready to quit

## 2012-05-04 NOTE — Patient Instructions (Addendum)
Remote monitoring is used to monitor your Pacemaker of ICD from home. This monitoring reduces the number of office visits required to check your device to one time per year. It allows [place] to keep an eye on the functioning of your device to ensure it is working properly. You are scheduled for a device check from home on August 08, 2012. You may send your transmission at any time that day. If you have a wireless device, the transmission will be sent automatically. After your physician reviews your transmission, you will receive a postcard with your next transmission date.  Your physician wants you to follow-up in: 1 year with Dr Johney Frame.  You will receive a reminder letter in the mail two months in advance. If you don't receive a letter, please call our office to schedule the follow-up appointment.   Your physician recommends that you continue on your current medications as directed. Please refer to the Current Medication list given to you today.

## 2012-07-07 ENCOUNTER — Ambulatory Visit (INDEPENDENT_AMBULATORY_CARE_PROVIDER_SITE_OTHER): Payer: Medicare Other | Admitting: Cardiology

## 2012-07-07 ENCOUNTER — Encounter: Payer: Self-pay | Admitting: Cardiology

## 2012-07-07 VITALS — BP 109/67 | HR 72 | Ht 62.0 in | Wt 131.0 lb

## 2012-07-07 DIAGNOSIS — I38 Endocarditis, valve unspecified: Secondary | ICD-10-CM

## 2012-07-07 DIAGNOSIS — I495 Sick sinus syndrome: Secondary | ICD-10-CM

## 2012-07-07 DIAGNOSIS — F17200 Nicotine dependence, unspecified, uncomplicated: Secondary | ICD-10-CM

## 2012-07-07 NOTE — Assessment & Plan Note (Signed)
History of mitral and aortic regurgitation over time, relatively mild by assessment last year. Tricuspid regurgitation also noted of mild to moderate degree. No change in examination today. A followup ultrasound in one years time.

## 2012-07-07 NOTE — Progress Notes (Signed)
Clinical Summary [name] is a 66 y.o.female presenting for followup. I saw her in December of last year. She had recent interval followup with Dr. Johney Huang in October for device interrogation.  Echocardiogram from December 2012 demonstrated LVEF 55-60% with grade 1 diastolic dysfunction, mild mitral regurgitation, mild to moderate tricuspid regurgitation, no mention of significant aortic regurgitation.  She reports no unusual breathlessness or palpitations, no syncope. Continues to smoke cigarettes and has not been able to quit. Continue to recommend smoking cessation.  ECG today shows normal sinus rhythm.   No Known Allergies  Current Outpatient Prescriptions  Medication Sig Dispense Refill  . ALPRAZolam (XANAX) 1 MG tablet Take 1 mg by mouth 3 (three) times daily.        Marland Kitchen gabapentin (NEURONTIN) 300 MG capsule Take 1 capsule by mouth Three times a day.      . morphine (MSIR) 15 MG tablet Take 15 mg by mouth 3 (three) times daily.        Marland Kitchen omeprazole (PRILOSEC) 20 MG capsule Take 1 capsule by mouth Twice daily.      . promethazine (PHENERGAN) 25 MG tablet Take 25 mg by mouth every 6 (six) hours as needed.        . ranitidine (ZANTAC) 300 MG tablet Take 1 tablet by mouth Daily.      Marland Kitchen tiZANidine (ZANAFLEX) 4 MG tablet Take 4 mg by mouth 2 (two) times daily.      Marland Kitchen zolpidem (AMBIEN) 5 MG tablet Take 5 mg by mouth at bedtime as needed.          Past Medical History  Diagnosis Date  . Sick sinus syndrome     s/p PPM (SJM)  . Neurodermatitis   . Syncope   . COPD with emphysema   . Depression   . GERD (gastroesophageal reflux disease)   . Peptic ulcer   . Mitral and aortic regurgitation     Mild  . Chronic pain     Past Surgical History  Procedure Date  . Cholecystectomy   . Nissen fundoplication   . Carpal tunnel release   . Shoulder surgery   . Appendectomy   . Splenectomy   . Pacemaker insertion     St Jude    Social History [name] reports that she has  been smoking Cigarettes.  She has a 80 pack-year smoking history. She has never used smokeless tobacco. [name] reports that she does not drink alcohol.  Review of Systems Occasional atypical left lateral thoracic discomfort, sporadic without exertional component. No cough, fevers or chills. Otherwise negative.  Physical Examination Filed Vitals:   07/07/12 1006  BP: 109/67  Pulse: 72   Filed Weights   07/07/12 1006  Weight: 131 lb (59.421 kg)    Thin woman in no acute distress.  HEENT: Conjunctiva and lids normal, oropharynx with moist mucosa.  Neck: Supple, no elevated JVP or carotid bruits.  Chest wall: Stable device pocket site on the left.  Cardiac: Regular rate and rhythm, no S3 gallop, is soft apical systolic murmur.  Lungs: Diminished breath sounds throughout, nonlabored, no wheezing.  Extremities: No pitting edema.   Problem List and Plan   VALVULAR HEART DISEASE History of mitral and aortic regurgitation over time, relatively mild by assessment last year. Tricuspid regurgitation also noted of mild to moderate degree. No change in examination today. A followup ultrasound in one years time.  TOBACCO ABUSE Continue recommend smoking cessation.  Sick sinus syndrome Status post St.  Jude pacemaker placement, followed by Dr. Johney Huang.    Jonelle Sidle, M.D., F.A.C.C.

## 2012-07-07 NOTE — Assessment & Plan Note (Signed)
Status post St. Jude pacemaker placement, followed by Dr. Johney Frame.

## 2012-07-07 NOTE — Patient Instructions (Addendum)
Your physician recommends that you schedule a follow-up appointment in: 1 year. You will receive a reminder letter in the mail in about 10 months reminding you to call and schedule your appointment. If you don't receive this letter, please contact our office. Your physician recommends that you continue on your current medications as directed. Please refer to the Current Medication list given to you today. Your physician has requested that you have an echocardiogram in 1 year. Echocardiography is a painless test that uses sound waves to create images of your heart. It provides your doctor with information about the size and shape of your heart and how well your heart's chambers and valves are working. This procedure takes approximately one hour. There are no restrictions for this procedure.  

## 2012-07-07 NOTE — Assessment & Plan Note (Signed)
Continue recommend smoking cessation.

## 2012-08-08 ENCOUNTER — Encounter: Payer: Medicare Other | Admitting: *Deleted

## 2012-08-09 ENCOUNTER — Encounter: Payer: Self-pay | Admitting: *Deleted

## 2013-05-01 ENCOUNTER — Other Ambulatory Visit: Payer: Self-pay | Admitting: *Deleted

## 2013-05-01 DIAGNOSIS — I495 Sick sinus syndrome: Secondary | ICD-10-CM

## 2013-05-01 DIAGNOSIS — I38 Endocarditis, valve unspecified: Secondary | ICD-10-CM

## 2013-05-18 ENCOUNTER — Encounter: Payer: Self-pay | Admitting: Internal Medicine

## 2013-05-18 ENCOUNTER — Ambulatory Visit (INDEPENDENT_AMBULATORY_CARE_PROVIDER_SITE_OTHER): Payer: Medicare Other | Admitting: Internal Medicine

## 2013-05-18 ENCOUNTER — Encounter: Payer: Self-pay | Admitting: Cardiology

## 2013-05-18 VITALS — BP 124/76 | HR 85 | Ht 62.0 in | Wt 153.8 lb

## 2013-05-18 DIAGNOSIS — Z950 Presence of cardiac pacemaker: Secondary | ICD-10-CM

## 2013-05-18 DIAGNOSIS — R079 Chest pain, unspecified: Secondary | ICD-10-CM

## 2013-05-18 DIAGNOSIS — R0602 Shortness of breath: Secondary | ICD-10-CM

## 2013-05-18 DIAGNOSIS — I495 Sick sinus syndrome: Secondary | ICD-10-CM

## 2013-05-18 DIAGNOSIS — F17200 Nicotine dependence, unspecified, uncomplicated: Secondary | ICD-10-CM

## 2013-05-18 NOTE — Progress Notes (Signed)
PCP: Toma Deiters, MD Primary Cardiologist:  Dr Diona Browner  The patient presents today for routine electrophysiology followup.  Since last being seen in our clinic, the patient reports doing reasonably well.  She reports ongoing chronic stable chest wall pain which is sharp and under her left breast. This is more prolonged and worse than previously.  She also reports progressive SOB with exertion. Today, she denies symptoms of palpitations, orthopnea, PND, lower extremity edema, dizziness, presyncope, syncope, or neurologic sequela.  The patient feels that she is tolerating medications without difficulties and is otherwise without complaint today.   Past Medical History  Diagnosis Date  . Sick sinus syndrome     s/p PPM (SJM)  . Neurodermatitis   . Syncope   . COPD with emphysema   . Depression   . GERD (gastroesophageal reflux disease)   . Peptic ulcer   . Mitral and aortic regurgitation     Mild  . Chronic pain    Past Surgical History  Procedure Laterality Date  . Cholecystectomy    . Nissen fundoplication    . Carpal tunnel release    . Shoulder surgery    . Appendectomy    . Splenectomy    . Pacemaker insertion      St Jude    Current Outpatient Prescriptions  Medication Sig Dispense Refill  . ALPRAZolam (XANAX) 1 MG tablet Take 1 mg by mouth 3 (three) times daily.        Marland Kitchen gabapentin (NEURONTIN) 400 MG capsule Take 400 mg by mouth 3 (three) times daily.      Marland Kitchen ipratropium (ATROVENT) 0.02 % nebulizer solution Take 500 mcg by nebulization every 4 (four) hours.      Marland Kitchen morphine (MSIR) 15 MG tablet Take 15 mg by mouth 3 (three) times daily.        Marland Kitchen omeprazole (PRILOSEC) 20 MG capsule Take 1 capsule by mouth Twice daily.      . promethazine (PHENERGAN) 25 MG tablet Take 25 mg by mouth every 6 (six) hours as needed.        . zolpidem (AMBIEN) 5 MG tablet Take 5 mg by mouth at bedtime as needed.        Marland Kitchen tiZANidine (ZANAFLEX) 4 MG tablet Take 4 mg by mouth 2 (two) times daily.        No current facility-administered medications for this visit.    No Known Allergies  History   Social History  . Marital Status: Married    Spouse Name: N/A    Number of Children: 1  . Years of Education: N/A   Occupational History  . Not on file.   Social History Main Topics  . Smoking status: Former Smoker -- 2.00 packs/day for 40 years    Types: Cigarettes    Quit date: 08/18/2012  . Smokeless tobacco: Never Used     Comment: I am pleased that she recently quit.  . Alcohol Use: No     Comment: Former heavy drinker  . Drug Use: No     Comment: Former benodiazepine dependence  . Sexual Activity: Not on file   Other Topics Concern  . Not on file   Social History Narrative  . No narrative on file   Physical Exam: Filed Vitals:   05/18/13 1008  BP: 124/76  Pulse: 85  Height: 5\' 2"  (1.575 m)  Weight: 153 lb 12.8 oz (69.763 kg)    GEN- The patient is well appearing, alert and oriented x 3 today.  Head- normocephalic, atraumatic Eyes-  Sclera clear, conjunctiva pink Ears- hearing intact Oropharynx- clear Neck- supple, no JVP Lymph- no cervical lymphadenopathy Lungs- Clear to ausculation bilaterally, normal work of breathing Chest- pacemaker pocket is well healed Heart- Regular rate and rhythm, no murmurs, rubs or gallops, PMI not laterally displaced GI- soft, NT, ND, + BS Extremities- no clubbing, cyanosis, or edema  Pacemaker interrogation- reviewed in detail today,  See PACEART report  Assessment and Plan:  1. Bradycardia Normal pacemaker function See Pace Art report No changes today  2. Chest pain Both typical and atypical features.  She has not had imaging in several years.  Given progression of symptoms, I think that further CV evaluation is warranted at this time.  I will obtain an echo and lexiscan myoview.  She will  Follow-up with Dr Diona Browner as scheduled in December.  3. Tobacco  She quit 1/14!  I was enthusiastic in celebration with  her over this today.  Return to the device clinic in 1 year Merlin every 3 months

## 2013-05-18 NOTE — Patient Instructions (Signed)
Your physician recommends that you schedule a follow-up appointment in: As scheduled with Dr. Diona Browner and 1 year with Dr. Johney Frame  Your physician recommends that you continue on your current medications as directed. Please refer to the Current Medication list given to you today.  Your physician has requested that you have a lexiscan myoview. For further information please visit [email or web address] Please follow instruction sheet, as given.  Your physician has requested that you have an echocardiogram. Echocardiography is a painless test that uses sound waves to create images of your heart. It provides your doctor with information about the size and shape of your heart and how well your heart's chambers and valves are working. This procedure takes approximately one hour. There are no restrictions for this procedure.

## 2013-05-19 LAB — PACEMAKER DEVICE OBSERVATION
AL AMPLITUDE: 5 mv
AL IMPEDENCE PM: 475 Ohm
BATTERY VOLTAGE: 2.9478 V
RV LEAD AMPLITUDE: 11.7 mv

## 2013-05-24 ENCOUNTER — Other Ambulatory Visit: Payer: Self-pay

## 2013-05-24 ENCOUNTER — Other Ambulatory Visit (INDEPENDENT_AMBULATORY_CARE_PROVIDER_SITE_OTHER): Payer: Medicare Other

## 2013-05-24 DIAGNOSIS — I495 Sick sinus syndrome: Secondary | ICD-10-CM

## 2013-05-24 DIAGNOSIS — R079 Chest pain, unspecified: Secondary | ICD-10-CM

## 2013-05-24 DIAGNOSIS — R0602 Shortness of breath: Secondary | ICD-10-CM

## 2013-05-24 DIAGNOSIS — I38 Endocarditis, valve unspecified: Secondary | ICD-10-CM

## 2013-05-29 ENCOUNTER — Telehealth: Payer: Self-pay | Admitting: *Deleted

## 2013-05-29 NOTE — Telephone Encounter (Signed)
Patient informed. 

## 2013-05-29 NOTE — Telephone Encounter (Signed)
Message copied by Eustace Moore on Mon May 29, 2013  4:48 PM ------      Message from: MCDOWELL, Illene Bolus      Created: Thu May 25, 2013  1:04 PM       This study was ordered by Dr. Johney Frame. LVEF in normal range with grade 1 diastolic dysfunction. No major valvular abnormalities beyond mild aortic regurgitation and mild tricuspid regurgitation. ------

## 2013-05-30 ENCOUNTER — Encounter (HOSPITAL_COMMUNITY): Payer: Self-pay

## 2013-05-30 ENCOUNTER — Encounter (HOSPITAL_COMMUNITY)
Admission: RE | Admit: 2013-05-30 | Discharge: 2013-05-30 | Disposition: A | Discharge status: Home / Self Care | Payer: Medicare Other | Source: Ambulatory Visit | Attending: Internal Medicine | Admitting: Internal Medicine

## 2013-05-30 DIAGNOSIS — R0602 Shortness of breath: Secondary | ICD-10-CM

## 2013-05-30 DIAGNOSIS — R079 Chest pain, unspecified: Secondary | ICD-10-CM

## 2013-05-30 DIAGNOSIS — R0609 Other forms of dyspnea: Secondary | ICD-10-CM | POA: Insufficient documentation

## 2013-05-30 DIAGNOSIS — R0989 Other specified symptoms and signs involving the circulatory and respiratory systems: Secondary | ICD-10-CM | POA: Insufficient documentation

## 2013-05-30 HISTORY — DX: Essential (primary) hypertension: I10

## 2013-05-30 MED ORDER — TECHNETIUM TC 99M SESTAMIBI - CARDIOLITE
10.0000 | Freq: Once | INTRAVENOUS | Status: AC | PRN
  Administered 2013-05-30: 10 via INTRAVENOUS

## 2013-05-30 MED ORDER — TECHNETIUM TC 99M SESTAMIBI - CARDIOLITE
30.0000 | Freq: Once | INTRAVENOUS | Status: AC | PRN
  Administered 2013-05-30: 30 via INTRAVENOUS

## 2013-05-30 MED ORDER — REGADENOSON 0.4 MG/5ML IV SOLN
INTRAVENOUS | Status: AC
  Administered 2013-05-30: 0.4 mg via INTRAVENOUS
  Filled 2013-05-30: qty 5

## 2013-05-30 MED ORDER — SODIUM CHLORIDE 0.9 % IJ SOLN
INTRAMUSCULAR | Status: AC
  Administered 2013-05-30: 10 mL via INTRAVENOUS
  Filled 2013-05-30: qty 10

## 2013-05-30 NOTE — Progress Notes (Signed)
Stress Lab Nurses Notes - [name]  [name] 05/30/2013 Reason for doing test: CAD, Chest Pain and Dyspnea Type of test: [name] Nurse performing test: Parke Poisson, RN Nuclear Medicine Tech: Lyndel Pleasure Echo Tech: Not Applicable MD performing test: Dr.Branch/K.Lawrence NP Family MD: Hasanaj Test explained and consent signed: yes IV started: 22g jelco, Saline lock flushed, No redness or edema and Saline lock started in radiology Symptoms: headache, SOB & Dizziness Treatment/Intervention: None Reason test stopped: protocol completed After recovery IV was: Discontinued via X-ray tech and No redness or edema Patient to return to Nuc. Med at : 12:00 Patient discharged: Home Patient's Condition upon discharge was: stable Comments: During test BP 152/69 & HR 98.  Recovery BP 150/70 & HR 86.  Symptoms resolved in recovery. Erskine Speed T

## 2013-05-31 ENCOUNTER — Encounter: Payer: Self-pay | Admitting: *Deleted

## 2013-06-02 ENCOUNTER — Telehealth: Payer: Self-pay | Admitting: *Deleted

## 2013-06-02 NOTE — Telephone Encounter (Signed)
Patient stated Dr. Camelia Eng is out of the country and she needed her morphine rx. Nurse advised her that our office don't prescribe morphine and she would need to contact whomever is on call for her PCP. Patient verbalized understanding.

## 2013-06-28 ENCOUNTER — Other Ambulatory Visit: Payer: Self-pay

## 2013-07-12 ENCOUNTER — Ambulatory Visit (INDEPENDENT_AMBULATORY_CARE_PROVIDER_SITE_OTHER): Payer: Medicare Other | Admitting: Cardiology

## 2013-07-12 ENCOUNTER — Encounter: Payer: Self-pay | Admitting: Cardiology

## 2013-07-12 VITALS — BP 142/80 | HR 77 | Ht 62.0 in | Wt 153.4 lb

## 2013-07-12 DIAGNOSIS — R079 Chest pain, unspecified: Secondary | ICD-10-CM

## 2013-07-12 DIAGNOSIS — I38 Endocarditis, valve unspecified: Secondary | ICD-10-CM

## 2013-07-12 DIAGNOSIS — I495 Sick sinus syndrome: Secondary | ICD-10-CM

## 2013-07-12 NOTE — Patient Instructions (Signed)
Continue all current medications. Your physician wants you to follow up in:  1 year.  You will receive a reminder letter in the mail one-two months in advance.  If you don't receive a letter, please call our office to schedule the follow up appointment   

## 2013-07-12 NOTE — Progress Notes (Signed)
Clinical Summary [name] is a 67 y.o.female last seen in December 2013. More recently she was seen by Dr. Johney Huang in October of this year. He schedule a followup Lexiscan Cardiolite which was done at [name] and was normal without evidence of ischemia, LVEF 72%.  Echocardiogram from December 2012 demonstrated LVEF 55-60% with grade 1 diastolic dysfunction, mild mitral regurgitation, mild to moderate tricuspid regurgitation, no mention of significant aortic regurgitation. Her valvular heart disease is asymptomatic.  ECG today shows sinus rhythm with nonspecific T-wave changes.  She brought in a medical documentation request from the [name] and I completed the cardiac section as it relates to her diagnosis of sick sinus syndrome with pacemaker placement, followed by Dr. Johney Huang. I explained to her that the rest of the information will be provided by her primary care provider.   No Known Allergies  Current Outpatient Prescriptions  Medication Sig Dispense Refill  . ALPRAZolam (XANAX) 1 MG tablet Take 1 mg by mouth 3 (three) times daily.        Marland Kitchen gabapentin (NEURONTIN) 400 MG capsule Take 400 mg by mouth 3 (three) times daily.      Marland Kitchen ipratropium (ATROVENT) 0.02 % nebulizer solution Take 500 mcg by nebulization every 4 (four) hours.      Marland Kitchen morphine (MSIR) 15 MG tablet Take 15 mg by mouth 3 (three) times daily.        Marland Kitchen omeprazole (PRILOSEC) 20 MG capsule Take 1 capsule by mouth Twice daily.      . promethazine (PHENERGAN) 25 MG tablet Take 25 mg by mouth every 6 (six) hours as needed.        . zolpidem (AMBIEN) 5 MG tablet Take 5 mg by mouth at bedtime as needed.         No current facility-administered medications for this visit.    Past Medical History  Diagnosis Date  . Sick sinus syndrome     s/p PPM (SJM)  . Neurodermatitis   . Syncope   . COPD with emphysema   . Depression   . GERD (gastroesophageal reflux disease)   . Peptic ulcer   . Mitral and aortic regurgitation    Mild  . Chronic pain   . Hypertension     Social History [name] reports that she quit smoking about 10 months ago. Her smoking use included Cigarettes. She has a 80 pack-year smoking history. She has never used smokeless tobacco. [name] reports that she does not drink alcohol.  Review of Systems No palpitations or syncope. No cardiac hospitalizations. No orthopnea or PND. Otherwise negative.  Physical Examination Filed Vitals:   07/12/13 1321  BP: 142/80  Pulse: 77   Filed Weights   07/12/13 1321  Weight: 153 lb 6.4 oz (69.582 kg)    Comfortable at rest. HEENT: Conjunctiva and lids normal, oropharynx with moist mucosa.  Neck: Supple, no elevated JVP or carotid bruits.  Chest wall: Stable device pocket site on the left.  Cardiac: Regular rate and rhythm, no S3 gallop, is soft apical systolic murmur.  Lungs: Diminished breath sounds throughout, nonlabored, no wheezing.  Extremities: No pitting edema. Skin: Warm and dry. Musculoskeletal: No kyphosis. Neuropsychiatric: Alert and oriented x3, affect appropriate.  Problem List and Plan   VALVULAR HEART DISEASE Overall mild and asymptomatic. Examination is stable. No plan for followup echocardiogram at this time.  Sick sinus syndrome Followed by Dr. Johney Huang status post [name]. [name] pacemaker placement. She is on no specific cardiac medications at  this time.  Chest pain No progressive symptoms, documented by Dr. Johney Huang at his last visit in October. Patient had a followup Cardiolite that was negative for ischemia.    Jonelle Sidle, M.D., F.A.C.C.

## 2013-07-12 NOTE — Assessment & Plan Note (Addendum)
Followed by Dr. Johney Frame status post [facility]. Jude pacemaker placement. She is on no specific cardiac medications at this time.

## 2013-07-12 NOTE — Assessment & Plan Note (Addendum)
Overall mild and asymptomatic. Examination is stable. No plan for followup echocardiogram at this time.

## 2013-07-12 NOTE — Assessment & Plan Note (Signed)
No progressive symptoms, documented by Dr. Johney Frame at his last visit in October. Patient had a followup Cardiolite that was negative for ischemia.

## 2013-08-18 ENCOUNTER — Telehealth: Payer: Self-pay | Admitting: Internal Medicine

## 2013-08-18 NOTE — Telephone Encounter (Signed)
LMOVM w/ my direct #. 

## 2013-08-18 NOTE — Telephone Encounter (Signed)
New Prob    Pt is having some difficulty transmitting. Please call.

## 2013-08-21 NOTE — Telephone Encounter (Signed)
Pt going to [facility] 09/06/13. Pt needs cell adapter but has Merlin with non-compatible software version from 2010.

## 2013-09-06 ENCOUNTER — Ambulatory Visit (INDEPENDENT_AMBULATORY_CARE_PROVIDER_SITE_OTHER): Payer: Medicare Other | Admitting: *Deleted

## 2013-09-06 DIAGNOSIS — I495 Sick sinus syndrome: Secondary | ICD-10-CM

## 2013-09-06 LAB — MDC_IDC_ENUM_SESS_TYPE_INCLINIC
Battery Remaining Longevity: 106.8 mo
Battery Voltage: 2.95 V
Implantable Pulse Generator Serial Number: 2300232
Lead Channel Pacing Threshold Amplitude: 0.5 V
Lead Channel Pacing Threshold Amplitude: 0.625 V
Lead Channel Pacing Threshold Pulse Width: 0.4 ms
Lead Channel Setting Pacing Amplitude: 0.875
Lead Channel Setting Pacing Amplitude: 1.5 V
Lead Channel Setting Pacing Pulse Width: 0.4 ms
Lead Channel Setting Sensing Sensitivity: 2 mV
MDC IDC MSMT LEADCHNL RA IMPEDANCE VALUE: 462.5 Ohm
MDC IDC MSMT LEADCHNL RA PACING THRESHOLD PULSEWIDTH: 0.5 ms
MDC IDC MSMT LEADCHNL RA SENSING INTR AMPL: 4.3 mV
MDC IDC MSMT LEADCHNL RV IMPEDANCE VALUE: 587.5 Ohm
MDC IDC MSMT LEADCHNL RV SENSING INTR AMPL: 10.1 mV
MDC IDC SESS DTM: 20150211154713
MDC IDC STAT BRADY RA PERCENT PACED: 43 %
MDC IDC STAT BRADY RV PERCENT PACED: 0.27 %

## 2013-09-06 NOTE — Progress Notes (Signed)
Pacemaker check in clinic. Battery longevity 8.7 years. 11 mode switch episodes--7 minutes 28 seconds. No ventricular high rate episodes. Merlin 12-11-13 and ROV in October with [facility].

## 2013-09-15 ENCOUNTER — Encounter: Payer: Self-pay | Admitting: Internal Medicine

## 2013-12-11 ENCOUNTER — Encounter: Payer: Medicare Other | Admitting: *Deleted

## 2013-12-11 ENCOUNTER — Telehealth: Payer: Self-pay | Admitting: Cardiology

## 2013-12-11 NOTE — Telephone Encounter (Signed)
Spoke with pt and reminded pt of remote transmission that is due today. Pt stated that she could not get monitor to work and that she has already called the [employer] and they are sending her a new monitor. Pt verbalized that she would send transmission when she received new monitor.

## 2013-12-15 ENCOUNTER — Encounter: Payer: Self-pay | Admitting: Cardiology

## 2013-12-15 ENCOUNTER — Encounter: Payer: Self-pay | Admitting: *Deleted

## 2013-12-21 ENCOUNTER — Telehealth: Payer: Self-pay | Admitting: Internal Medicine

## 2013-12-21 LAB — MDC_IDC_ENUM_SESS_TYPE_REMOTE
Battery Remaining Longevity: 107 mo
Brady Statistic AP VP Percent: 1 %
Brady Statistic AP VS Percent: 58 %
Brady Statistic AS VS Percent: 41 %
Brady Statistic RA Percent Paced: 58 %
Brady Statistic RV Percent Paced: 1 %
Implantable Pulse Generator Model: 2110
Implantable Pulse Generator Serial Number: 2300232
Lead Channel Pacing Threshold Amplitude: 0.375 V
Lead Channel Pacing Threshold Amplitude: 0.625 V
Lead Channel Pacing Threshold Pulse Width: 0.4 ms
Lead Channel Pacing Threshold Pulse Width: 0.5 ms
Lead Channel Sensing Intrinsic Amplitude: 11.7 mV
Lead Channel Setting Pacing Amplitude: 1.375
Lead Channel Setting Pacing Pulse Width: 0.4 ms
Lead Channel Setting Sensing Sensitivity: 2 mV
MDC IDC MSMT BATTERY REMAINING PERCENTAGE: 74 %
MDC IDC MSMT BATTERY VOLTAGE: 2.95 V
MDC IDC MSMT LEADCHNL RA IMPEDANCE VALUE: 510 Ohm
MDC IDC MSMT LEADCHNL RA SENSING INTR AMPL: 5 mV
MDC IDC MSMT LEADCHNL RV IMPEDANCE VALUE: 610 Ohm
MDC IDC SESS DTM: 20150528153459
MDC IDC SET LEADCHNL RV PACING AMPLITUDE: 0.875
MDC IDC STAT BRADY AS VP PERCENT: 1 %

## 2013-12-21 NOTE — Telephone Encounter (Signed)
Patient is confused on what she is suppose to return and what she needs to keep

## 2013-12-21 NOTE — Telephone Encounter (Signed)
Spoke w/pt and clarified

## 2013-12-22 ENCOUNTER — Ambulatory Visit (INDEPENDENT_AMBULATORY_CARE_PROVIDER_SITE_OTHER): Payer: Medicare Other | Admitting: *Deleted

## 2013-12-22 ENCOUNTER — Telehealth: Payer: Self-pay | Admitting: Internal Medicine

## 2013-12-22 DIAGNOSIS — I495 Sick sinus syndrome: Secondary | ICD-10-CM

## 2013-12-22 NOTE — Telephone Encounter (Signed)
Follow up     Did we get her remote transmission from yesterday?

## 2013-12-22 NOTE — Telephone Encounter (Signed)
Transmission received, patient aware. 

## 2013-12-22 NOTE — Telephone Encounter (Signed)
Follow up     Call [phone] if no answer on other number

## 2013-12-22 NOTE — Progress Notes (Signed)
Remote pacemaker transmission.   

## 2013-12-22 NOTE — Telephone Encounter (Signed)
New Message:  Pt is calling to get instructions on how to send in her remote transmission

## 2014-01-23 ENCOUNTER — Encounter: Payer: Self-pay | Admitting: Cardiology

## 2014-03-27 ENCOUNTER — Encounter: Payer: Self-pay | Admitting: Internal Medicine

## 2014-05-18 ENCOUNTER — Encounter: Payer: Self-pay | Admitting: Internal Medicine

## 2014-05-18 ENCOUNTER — Ambulatory Visit (INDEPENDENT_AMBULATORY_CARE_PROVIDER_SITE_OTHER): Payer: Medicare Other | Admitting: Internal Medicine

## 2014-05-18 VITALS — BP 124/78 | HR 84 | Ht 62.0 in | Wt 144.0 lb

## 2014-05-18 DIAGNOSIS — I495 Sick sinus syndrome: Secondary | ICD-10-CM | POA: Diagnosis not present

## 2014-05-18 DIAGNOSIS — F17200 Nicotine dependence, unspecified, uncomplicated: Secondary | ICD-10-CM

## 2014-05-18 DIAGNOSIS — R072 Precordial pain: Secondary | ICD-10-CM

## 2014-05-18 DIAGNOSIS — Z720 Tobacco use: Secondary | ICD-10-CM

## 2014-05-18 DIAGNOSIS — Z950 Presence of cardiac pacemaker: Secondary | ICD-10-CM

## 2014-05-18 LAB — MDC_IDC_ENUM_SESS_TYPE_INCLINIC
Brady Statistic RA Percent Paced: 56 %
Brady Statistic RV Percent Paced: 1.9 %
Implantable Pulse Generator Serial Number: 2300232
Lead Channel Impedance Value: 487.5 Ohm
Lead Channel Impedance Value: 775 Ohm
Lead Channel Pacing Threshold Amplitude: 0.5 V
Lead Channel Pacing Threshold Pulse Width: 0.4 ms
Lead Channel Pacing Threshold Pulse Width: 0.5 ms
Lead Channel Setting Pacing Amplitude: 0.75 V
Lead Channel Setting Pacing Amplitude: 1.375
Lead Channel Setting Sensing Sensitivity: 2 mV
MDC IDC MSMT BATTERY REMAINING LONGEVITY: 129.6 mo
MDC IDC MSMT BATTERY VOLTAGE: 2.95 V
MDC IDC MSMT LEADCHNL RA PACING THRESHOLD AMPLITUDE: 0.375 V
MDC IDC MSMT LEADCHNL RA SENSING INTR AMPL: 4.4 mV
MDC IDC MSMT LEADCHNL RV SENSING INTR AMPL: 10.8 mV
MDC IDC SESS DTM: 20151023115416
MDC IDC SET LEADCHNL RV PACING PULSEWIDTH: 0.4 ms

## 2014-05-18 NOTE — Progress Notes (Signed)
PCP: Neale Burly, MD Primary Cardiologist:  Dr Domenic Polite  The patient presents today for routine electrophysiology followup.  Since last being seen in our clinic, the patient reports doing reasonably well.  She reports ongoing chronic stable chest wall pain which is sharp and under her left breast.  This was evaluated by previously with stress testing and echo which were unrevealing.   Today, she denies symptoms of palpitations, orthopnea, PND, lower extremity edema, dizziness, presyncope, syncope, or neurologic sequela.  The patient feels that she is tolerating medications without difficulties and is otherwise without complaint today.   Past Medical History  Diagnosis Date  . Sick sinus syndrome     s/p PPM (SJM)  . Neurodermatitis   . Syncope   . COPD with emphysema   . Depression   . GERD (gastroesophageal reflux disease)   . Peptic ulcer   . Mitral and aortic regurgitation     Mild  . Chronic pain   . Hypertension    Past Surgical History  Procedure Laterality Date  . Cholecystectomy    . Nissen fundoplication    . Carpal tunnel release    . Shoulder surgery    . Appendectomy    . Splenectomy    . Pacemaker insertion      St Jude    Current Outpatient Prescriptions  Medication Sig Dispense Refill  . ALPRAZolam (XANAX) 1 MG tablet Take 1 mg by mouth 3 (three) times daily.        Marland Kitchen gabapentin (NEURONTIN) 400 MG capsule Take 400 mg by mouth 3 (three) times daily.      Marland Kitchen ipratropium (ATROVENT) 0.02 % nebulizer solution Take 500 mcg by nebulization every 4 (four) hours.      Marland Kitchen morphine (MSIR) 15 MG tablet Take 15 mg by mouth 3 (three) times daily.        Marland Kitchen omeprazole (PRILOSEC) 20 MG capsule Take 1 capsule by mouth Twice daily.      . promethazine (PHENERGAN) 25 MG tablet Take 25 mg by mouth every 6 (six) hours as needed.        . zolpidem (AMBIEN) 5 MG tablet Take 5 mg by mouth at bedtime as needed.         No current facility-administered medications for this visit.     No Known Allergies  History   Social History  . Marital Status: Married    Spouse Name: N/A    Number of Children: 1  . Years of Education: N/A   Occupational History  . Not on file.   Social History Main Topics  . Smoking status: Former Smoker -- 2.00 packs/day for 40 years    Types: Cigarettes    Start date: 11/13/1963    Quit date: 08/18/2012  . Smokeless tobacco: Never Used     Comment: I am pleased that she recently quit.  . Alcohol Use: No     Comment: Former heavy drinker  . Drug Use: No     Comment: Former benodiazepine dependence  . Sexual Activity: Not on file   Other Topics Concern  . Not on file   Social History Narrative  . No narrative on file   Physical Exam: Filed Vitals:   05/18/14 1138  BP: 124/78  Pulse: 84  Height: 5\' 2"  (1.575 m)  Weight: 144 lb (65.318 kg)    GEN- The patient is well appearing, alert and oriented x 3 today.   Head- normocephalic, atraumatic Eyes-  Sclera clear, conjunctiva pink Ears-  hearing intact Oropharynx- clear Neck- supple, no JVP Lymph- no cervical lymphadenopathy Lungs- Clear to ausculation bilaterally, normal work of breathing Chest- pacemaker pocket is well healed.  Her L chest wall is diffusely ttp and reproduces her chest wall pain Heart- Regular rate and rhythm, no murmurs, rubs or gallops, PMI not laterally displaced GI- soft, NT, ND, + BS Extremities- no clubbing, cyanosis, or edema  Pacemaker interrogation- reviewed in detail today,  See PACEART report  Assessment and Plan:  1. Sinus radycardia Normal pacemaker function See Pace Art report No changes today  2. Chest pain Due to chest wall pain She says that she is taking morphine for this. I have discouraged use of morphine.  I think that tylenol or ibuprofen would be more appropriate.    3. Tobacco  She remains quit since 1/14!   Return to the device clinic in 1 year Merlin every 3 months

## 2014-05-18 NOTE — Patient Instructions (Signed)
Your physician recommends that you schedule a follow-up appointment in: 1 year with Dr. Rayann Heman. You will receive a reminder letter in the mail in about 10 months reminding you to call and schedule your appointment. If you don't receive this letter, please contact our office. Merlin device check on 08/20/2014. Your physician recommends that you continue on your current medications as directed. Please refer to the Current Medication list given to you today.

## 2014-05-29 ENCOUNTER — Ambulatory Visit: Payer: Medicare Other | Admitting: Cardiology

## 2014-07-16 ENCOUNTER — Ambulatory Visit: Payer: Medicare Other | Admitting: Cardiology

## 2014-08-01 ENCOUNTER — Telehealth: Payer: Self-pay | Admitting: *Deleted

## 2014-08-01 NOTE — Telephone Encounter (Signed)
Message left on nurse's vm to confirm date of next device check.

## 2014-08-01 NOTE — Telephone Encounter (Signed)
Patient informed that next device check is set up on 08/20/14 via voicemail.

## 2014-08-20 ENCOUNTER — Encounter: Payer: Self-pay | Admitting: Internal Medicine

## 2014-08-20 ENCOUNTER — Ambulatory Visit (INDEPENDENT_AMBULATORY_CARE_PROVIDER_SITE_OTHER): Payer: Medicare Other | Admitting: *Deleted

## 2014-08-20 DIAGNOSIS — I495 Sick sinus syndrome: Secondary | ICD-10-CM

## 2014-08-20 LAB — MDC_IDC_ENUM_SESS_TYPE_REMOTE
Battery Remaining Percentage: 67 %
Brady Statistic AP VS Percent: 55 %
Brady Statistic AS VP Percent: 1 %
Brady Statistic AS VS Percent: 44 %
Brady Statistic RA Percent Paced: 55 %
Implantable Pulse Generator Model: 2110
Implantable Pulse Generator Serial Number: 2300232
Lead Channel Impedance Value: 450 Ohm
Lead Channel Impedance Value: 540 Ohm
Lead Channel Pacing Threshold Pulse Width: 0.4 ms
Lead Channel Pacing Threshold Pulse Width: 0.5 ms
Lead Channel Sensing Intrinsic Amplitude: 8.2 mV
Lead Channel Setting Pacing Amplitude: 0.875
Lead Channel Setting Pacing Amplitude: 1.5 V
Lead Channel Setting Sensing Sensitivity: 2 mV
MDC IDC MSMT BATTERY REMAINING LONGEVITY: 95 mo
MDC IDC MSMT BATTERY VOLTAGE: 2.93 V
MDC IDC MSMT LEADCHNL RA PACING THRESHOLD AMPLITUDE: 0.5 V
MDC IDC MSMT LEADCHNL RA SENSING INTR AMPL: 4.2 mV
MDC IDC MSMT LEADCHNL RV PACING THRESHOLD AMPLITUDE: 0.625 V
MDC IDC SESS DTM: 20160125161500
MDC IDC SET LEADCHNL RV PACING PULSEWIDTH: 0.4 ms
MDC IDC STAT BRADY AP VP PERCENT: 1 %
MDC IDC STAT BRADY RV PERCENT PACED: 1 %

## 2014-08-20 NOTE — Progress Notes (Signed)
Remote pacemaker transmission.   

## 2014-08-22 ENCOUNTER — Ambulatory Visit: Payer: Medicare Other | Admitting: Cardiology

## 2014-08-29 ENCOUNTER — Encounter: Payer: Self-pay | Admitting: Cardiology

## 2014-09-17 ENCOUNTER — Ambulatory Visit (INDEPENDENT_AMBULATORY_CARE_PROVIDER_SITE_OTHER): Payer: Medicare Other | Admitting: Cardiology

## 2014-09-17 ENCOUNTER — Encounter: Payer: Self-pay | Admitting: Cardiology

## 2014-09-17 VITALS — BP 132/76 | HR 78 | Ht 62.0 in | Wt 140.8 lb

## 2014-09-17 DIAGNOSIS — I495 Sick sinus syndrome: Secondary | ICD-10-CM

## 2014-09-17 DIAGNOSIS — I38 Endocarditis, valve unspecified: Secondary | ICD-10-CM

## 2014-09-17 DIAGNOSIS — R079 Chest pain, unspecified: Secondary | ICD-10-CM

## 2014-09-17 DIAGNOSIS — I10 Essential (primary) hypertension: Secondary | ICD-10-CM

## 2014-09-17 NOTE — Patient Instructions (Signed)
Your physician wants you to follow-up in: 1 year with Dr. McDowell You will receive a reminder letter in the mail two months in advance. If you don't receive a letter, please call our office to schedule the follow-up appointment.  Your physician recommends that you continue on your current medications as directed. Please refer to the Current Medication list given to you today.  Thank you for choosing [facility]!!   

## 2014-09-17 NOTE — Progress Notes (Signed)
Cardiology Office Note  Date: 09/17/2014   ID: [name], [name] 13-Dec-1945, MRN [id number]  PCP: Neale Burly, MD  Primary Cardiologist: Rozann Lesches, MD   Chief Complaint  Patient presents with  . Sick sinus syndrome  . Valvular heart disease    History of Present Illness: [name] is a 69 y.o. female last seen in December 2014.  Interval follow-up continues with Dr. Rayann Heman for device management. She presents for a routine visit today. States that she has been under a lot of emotional stress recently, had an episode of atypical left-sided thoracic pain a few weeks ago in this setting. No exertional symptoms however. Her most recent ischemic testing is noted below.  She reports NYHA class II dyspnea, no palpitations or syncope. ECG today is reviewed below.   Past Medical History  Diagnosis Date  . Sick sinus syndrome     s/p PPM (SJM)  . Neurodermatitis   . Syncope   . COPD with emphysema   . Depression   . GERD (gastroesophageal reflux disease)   . Peptic ulcer   . Mitral and aortic regurgitation     Mild  . Chronic pain   . Hypertension     Past Surgical History  Procedure Laterality Date  . Cholecystectomy    . Nissen fundoplication    . Carpal tunnel release    . Shoulder surgery    . Appendectomy    . Splenectomy    . Pacemaker insertion      St Jude    Current Outpatient Prescriptions  Medication Sig Dispense Refill  . ALPRAZolam (XANAX) 1 MG tablet Take 1 mg by mouth 3 (three) times daily.      Marland Kitchen gabapentin (NEURONTIN) 400 MG capsule Take 400 mg by mouth 3 (three) times daily.    Marland Kitchen ipratropium (ATROVENT) 0.02 % nebulizer solution Take 500 mcg by nebulization every 4 (four) hours.    Marland Kitchen morphine (MSIR) 15 MG tablet Take 15 mg by mouth 3 (three) times daily.      Marland Kitchen omeprazole (PRILOSEC) 20 MG capsule Take 1 capsule by mouth Twice daily.    Marland Kitchen zolpidem (AMBIEN) 5 MG tablet Take 5 mg by mouth at bedtime as needed.       No current  facility-administered medications for this visit.    Allergies:  Review of patient's allergies indicates no known allergies.   Social History: The patient  reports that she quit smoking about 2 years ago. Her smoking use included Cigarettes. She started smoking about 50 years ago. She has a 80 pack-year smoking history. She has never used smokeless tobacco. She reports that she does not drink alcohol or use illicit drugs.   ROS:  Please see the history of present illness. Otherwise, complete review of systems is positive for none.  All other systems are reviewed and negative.    Physical Exam: VS:  BP 132/76 mmHg  Pulse 78  Ht 5\' 2"  (1.575 m)  Wt 140 lb 12.8 oz (63.866 kg)  BMI 25.75 kg/m2  SpO2 95%, BMI Body mass index is 25.75 kg/(m^2).  Wt Readings from Last 3 Encounters:  09/17/14 140 lb 12.8 oz (63.866 kg)  05/18/14 144 lb (65.318 kg)  07/12/13 153 lb 6.4 oz (69.582 kg)     Comfortable at rest. HEENT: Conjunctiva and lids normal, oropharynx with moist mucosa.  Neck: Supple, no elevated JVP or carotid bruits.  Chest wall: Stable device pocket site on the left.  Cardiac: Regular rate and  rhythm, no S3 gallop, is soft apical systolic murmur.  Lungs: Diminished breath sounds throughout, nonlabored, no wheezing.  Extremities: No pitting edema.   ECG: ECG is ordered today and reviewed showing sinus rhythm with low voltage.   Other Studies Reviewed Today:  1. Cardiolite 05/30/2013: Lexiscan Sestamibi: Normal perfusion. LVEF was calculated at 72%.  Please s identification: Patient is a 69 year old with chest pain tests to evaluate, rule out ischemia.  Stress data: The patient underwent stress testing withLexiscan per protocol. Baseline EKG showed sinus rhythm 61 beats per min. Baseline blood pressure 121/63.  With the infusion of Lexiscan the patient developed no significant ST changes (less than 1 mm ST changes). She experienced no chest pain. Overall  electrically negative.  Nuclear data: The patient was studied in a 1 day rest stress protocol. Patient was injected with 10 mCi technetium 99 labeled sestamibi at rest, 30 mCi technetium 99 labeled sestamibi at stress. Images were reconstructed in short, vertical, horizontal axes.  In the initial stress images there was normal perfusion. In the recovery images there was normal perfusion.  On gating, LV EF was calculated at 72% with normal wall motion.  2. Echocardiogram 05/24/2013: Study Conclusions  - Left ventricle: The cavity size was normal. Wall thickness was normal. Systolic function was normal. The estimated ejection fraction was in the range of 60% to 65%. Wall motion was normal; there were no regional wall motion abnormalities. There was an increased relative contribution of atrial contraction to ventricular filling. Doppler parameters are consistent with abnormal left ventricular relaxation (grade 1 diastolic dysfunction). - Ventricular septum: Septal motion showed paradox. These changes are consistent with right ventricular pacing. - Aortic valve: Trileaflet; mildly thickened leaflets. Mild regurgitation. Valve area: 1.96cm^2(VTI). Valve area: 1.86cm^2 (Vmax). - Right ventricle: The cavity size was mildly dilated. Wall thickness was normal. Pacer wire or catheter noted in right ventricle. Systolic function was normal. - Right atrium: The atrium was mildly dilated. Pacer wire or catheter noted in right atrium. - Tricuspid valve: Mild regurgitation.   Assessment and Plan:  1. Sick sinus syndrome status post St. Jude pacemaker placement, followed by Dr. Rayann Heman.  2. Valvular heart disease, mild mitral and aortic regurgitation as noted. Asymptomatic, no change on examination. No clear indication for follow-up echocardiogram at this time.  3. Essential hypertension, currently managed by diet. Keep follow-up with Dr. Sherrie Sport.  Current  medicines are reviewed at length with the patient today.  The patient does not have concerns regarding medicines.  Orders Placed This Encounter  Procedures  . EKG 12-Lead    Disposition: FU with me in 1 year.   Signed, Satira Sark, MD, [facility] 09/17/2014 4:20 PM    [place] [facility] [place], [place], [place] [address] Phone: [phone]; Fax: [phone]

## 2014-11-20 ENCOUNTER — Ambulatory Visit (INDEPENDENT_AMBULATORY_CARE_PROVIDER_SITE_OTHER): Payer: Medicare Other | Admitting: *Deleted

## 2014-11-20 ENCOUNTER — Encounter: Payer: Self-pay | Admitting: Internal Medicine

## 2014-11-20 DIAGNOSIS — I495 Sick sinus syndrome: Secondary | ICD-10-CM | POA: Diagnosis not present

## 2014-11-20 LAB — MDC_IDC_ENUM_SESS_TYPE_REMOTE
Battery Remaining Longevity: 95 mo
Brady Statistic AS VS Percent: 48 %
Brady Statistic RA Percent Paced: 52 %
Date Time Interrogation Session: 20160426131621
Implantable Pulse Generator Model: 2110
Implantable Pulse Generator Serial Number: 2300232
Lead Channel Impedance Value: 460 Ohm
Lead Channel Pacing Threshold Amplitude: 0.5 V
Lead Channel Pacing Threshold Amplitude: 0.625 V
Lead Channel Pacing Threshold Pulse Width: 0.4 ms
Lead Channel Pacing Threshold Pulse Width: 0.5 ms
Lead Channel Sensing Intrinsic Amplitude: 5 mV
Lead Channel Sensing Intrinsic Amplitude: 9.9 mV
Lead Channel Setting Pacing Amplitude: 0.875
Lead Channel Setting Pacing Amplitude: 1.5 V
Lead Channel Setting Sensing Sensitivity: 2 mV
MDC IDC MSMT BATTERY REMAINING PERCENTAGE: 67 %
MDC IDC MSMT BATTERY VOLTAGE: 2.93 V
MDC IDC MSMT LEADCHNL RV IMPEDANCE VALUE: 590 Ohm
MDC IDC SET LEADCHNL RV PACING PULSEWIDTH: 0.4 ms
MDC IDC STAT BRADY AP VP PERCENT: 1 %
MDC IDC STAT BRADY AP VS PERCENT: 52 %
MDC IDC STAT BRADY AS VP PERCENT: 1 %
MDC IDC STAT BRADY RV PERCENT PACED: 1 %

## 2014-11-20 NOTE — Progress Notes (Signed)
Remote pacemaker transmission.   

## 2014-11-27 ENCOUNTER — Encounter: Payer: Self-pay | Admitting: Cardiology

## 2015-02-19 ENCOUNTER — Encounter: Payer: Medicare Other | Admitting: *Deleted

## 2015-03-06 ENCOUNTER — Encounter: Payer: Self-pay | Admitting: *Deleted

## 2015-03-14 ENCOUNTER — Encounter: Payer: Self-pay | Admitting: Internal Medicine

## 2015-03-14 ENCOUNTER — Ambulatory Visit (INDEPENDENT_AMBULATORY_CARE_PROVIDER_SITE_OTHER): Payer: Medicare Other | Admitting: *Deleted

## 2015-03-14 DIAGNOSIS — I495 Sick sinus syndrome: Secondary | ICD-10-CM

## 2015-03-19 NOTE — Progress Notes (Signed)
Remote pacemaker transmission.   

## 2015-03-26 LAB — CUP PACEART REMOTE DEVICE CHECK
Battery Remaining Percentage: 81 %
Brady Statistic AP VP Percent: 2.2 %
Brady Statistic AP VS Percent: 54 %
Brady Statistic RA Percent Paced: 55 %
Brady Statistic RV Percent Paced: 2.2 %
Date Time Interrogation Session: 20160818130113
Lead Channel Impedance Value: 490 Ohm
Lead Channel Impedance Value: 740 Ohm
Lead Channel Pacing Threshold Amplitude: 0.5 V
Lead Channel Pacing Threshold Amplitude: 0.5 V
Lead Channel Pacing Threshold Pulse Width: 0.4 ms
Lead Channel Pacing Threshold Pulse Width: 0.5 ms
Lead Channel Sensing Intrinsic Amplitude: 4.2 mV
Lead Channel Setting Pacing Amplitude: 1.5 V
Lead Channel Setting Pacing Pulse Width: 0.4 ms
Lead Channel Setting Sensing Sensitivity: 2 mV
MDC IDC MSMT BATTERY REMAINING LONGEVITY: 115 mo
MDC IDC MSMT BATTERY VOLTAGE: 2.93 V
MDC IDC MSMT LEADCHNL RV SENSING INTR AMPL: 11.3 mV
MDC IDC SET LEADCHNL RV PACING AMPLITUDE: 0.75 V
MDC IDC STAT BRADY AS VP PERCENT: 1 %
MDC IDC STAT BRADY AS VS PERCENT: 44 %
Pulse Gen Serial Number: 2300232

## 2015-03-29 ENCOUNTER — Encounter: Payer: Self-pay | Admitting: Cardiology

## 2015-05-03 ENCOUNTER — Encounter: Payer: Self-pay | Admitting: Internal Medicine

## 2015-05-03 ENCOUNTER — Ambulatory Visit (INDEPENDENT_AMBULATORY_CARE_PROVIDER_SITE_OTHER): Payer: Medicare Other | Admitting: Internal Medicine

## 2015-05-03 VITALS — BP 123/74 | HR 77 | Ht 62.0 in | Wt 137.1 lb

## 2015-05-03 DIAGNOSIS — I495 Sick sinus syndrome: Secondary | ICD-10-CM

## 2015-05-03 DIAGNOSIS — R079 Chest pain, unspecified: Secondary | ICD-10-CM

## 2015-05-03 DIAGNOSIS — Z950 Presence of cardiac pacemaker: Secondary | ICD-10-CM

## 2015-05-03 LAB — CUP PACEART INCLINIC DEVICE CHECK
Battery Voltage: 2.93 V
Brady Statistic RA Percent Paced: 57 %
Brady Statistic RV Percent Paced: 2 %
Lead Channel Impedance Value: 450 Ohm
Lead Channel Impedance Value: 750 Ohm
Lead Channel Pacing Threshold Pulse Width: 0.4 ms
Lead Channel Pacing Threshold Pulse Width: 0.5 ms
Lead Channel Sensing Intrinsic Amplitude: 10.9 mV
Lead Channel Sensing Intrinsic Amplitude: 4.2 mV
Lead Channel Setting Pacing Amplitude: 1.375
MDC IDC MSMT BATTERY REMAINING LONGEVITY: 115.2 mo
MDC IDC MSMT LEADCHNL RA PACING THRESHOLD AMPLITUDE: 0.375 V
MDC IDC MSMT LEADCHNL RV PACING THRESHOLD AMPLITUDE: 0.625 V
MDC IDC SESS DTM: 20161007120408
MDC IDC SET LEADCHNL RV PACING AMPLITUDE: 0.875
MDC IDC SET LEADCHNL RV PACING PULSEWIDTH: 0.4 ms
MDC IDC SET LEADCHNL RV SENSING SENSITIVITY: 2 mV
Pulse Gen Model: 2110
Pulse Gen Serial Number: 2300232

## 2015-05-03 NOTE — Progress Notes (Signed)
PCP: Neale Burly, MD Primary Cardiologist:  Dr Domenic Polite  The patient presents today for routine electrophysiology followup.  Since last being seen in our clinic, the patient reports doing reasonably well.  She reports ongoing chronic stable chest wall pain which is sharp and under her left breast.  This was evaluated by previously with stress testing and echo which were unrevealing.   Today, she denies symptoms of palpitations, orthopnea, PND, lower extremity edema, dizziness, presyncope, syncope, or neurologic sequela.  The patient feels that she is tolerating medications without difficulties and is otherwise without complaint today.   Past Medical History  Diagnosis Date  . Sick sinus syndrome (HCC)     s/p PPM (SJM)  . Neurodermatitis   . Syncope   . COPD with emphysema ([place])   . Depression   . GERD (gastroesophageal reflux disease)   . Peptic ulcer   . Mitral and aortic regurgitation     Mild  . Chronic pain   . Hypertension    Past Surgical History  Procedure Laterality Date  . Cholecystectomy    . Nissen fundoplication    . Carpal tunnel release    . Shoulder surgery    . Appendectomy    . Splenectomy    . Pacemaker insertion      St Jude    Current Outpatient Prescriptions  Medication Sig Dispense Refill  . ALPRAZolam (XANAX) 1 MG tablet Take 1 mg by mouth 3 (three) times daily.      . baclofen (LIORESAL) 10 MG tablet Take 10 mg by mouth 2 (two) times daily as needed for muscle spasms.    Marland Kitchen gabapentin (NEURONTIN) 400 MG capsule Take 400 mg by mouth 3 (three) times daily.    Marland Kitchen ipratropium (ATROVENT) 0.02 % nebulizer solution Take 500 mcg by nebulization every 4 (four) hours.    Marland Kitchen morphine (MSIR) 15 MG tablet Take 15 mg by mouth 3 (three) times daily.      Marland Kitchen omeprazole (PRILOSEC) 20 MG capsule Take 1 capsule by mouth Twice daily.    . propranolol (INDERAL) 10 MG tablet Take 10 mg by mouth 2 (two) times daily.    . ranitidine (ZANTAC) 300 MG tablet Take 300 mg by mouth  at bedtime.    . traZODone (DESYREL) 50 MG tablet Take 25 mg by mouth at bedtime as needed for sleep.     No current facility-administered medications for this visit.    No Known Allergies  Social History   Social History  . Marital Status: Married    Spouse Name: N/A  . Number of Children: 1  . Years of Education: N/A   Occupational History  . Not on file.   Social History Main Topics  . Smoking status: Former Smoker -- 2.00 packs/day for 40 years    Types: Cigarettes    Start date: 11/13/1963    Quit date: 08/18/2012  . Smokeless tobacco: Never Used     Comment: I am pleased that she recently quit.  . Alcohol Use: No     Comment: Former heavy drinker  . Drug Use: No     Comment: Former benodiazepine dependence  . Sexual Activity: Not on file   Other Topics Concern  . Not on file   Social History Narrative   Physical Exam: Filed Vitals:   05/03/15 1046  BP: 123/74  Pulse: 77  Height: 5\' 2"  (1.575 m)  Weight: 137 lb 1.9 oz (62.197 kg)  SpO2: 95%    GEN-  The patient is well appearing, alert and oriented x 3 today.   Head- normocephalic, atraumatic Eyes-  Sclera clear, conjunctiva pink Ears- hearing intact Oropharynx- clear Neck- supple,   Lungs- Clear to ausculation bilaterally, normal work of breathing Chest- pacemaker pocket is well healed.  Her L chest wal (axilla) is diffusely ttp and reproduces her chest wall pain Heart- Regular rate and rhythm, no murmurs, rubs or gallops, PMI not laterally displaced GI- soft, NT, ND, + BS Extremities- no clubbing, cyanosis, or edema  Pacemaker interrogation- reviewed in detail today,  See PACEART report ekg reveals atrial pacing, no ischemic changes  Assessment and Plan:  1. Sinus bradycardia Normal pacemaker function See Pace Art report No changes today  2. Chest pain Due to chest wall pain ekg today reveals atrial pacing, no ischemic changes  3. Tobacco  She remains quit since 1/14!   Return to the  device clinic in 1 year Merlin every 3 months

## 2015-05-03 NOTE — Patient Instructions (Addendum)
Your physician recommends that you continue on your current medications as directed. Please refer to the Current Medication list given to you today. Device check 08/05/15. Your physician recommends that you schedule a follow-up appointment in: 1 year with Dr. Rayann Heman. You will receive a reminder letter in the mail in about 10 months reminding you to call and schedule your appointment. If you don't receive this letter, please contact our office. You may also schedule this appointment today.

## 2015-05-10 ENCOUNTER — Encounter: Payer: Medicare Other | Admitting: Internal Medicine

## 2015-05-17 ENCOUNTER — Encounter: Payer: Self-pay | Admitting: Internal Medicine

## 2015-06-13 ENCOUNTER — Other Ambulatory Visit: Payer: Self-pay | Admitting: Pain Medicine

## 2015-06-13 DIAGNOSIS — M545 Low back pain: Secondary | ICD-10-CM

## 2015-08-05 ENCOUNTER — Ambulatory Visit (INDEPENDENT_AMBULATORY_CARE_PROVIDER_SITE_OTHER): Payer: PPO | Admitting: *Deleted

## 2015-08-05 DIAGNOSIS — I495 Sick sinus syndrome: Secondary | ICD-10-CM | POA: Diagnosis not present

## 2015-08-06 NOTE — Progress Notes (Signed)
Remote pacemaker transmission.   

## 2015-08-25 LAB — CUP PACEART REMOTE DEVICE CHECK
Brady Statistic AP VP Percent: 2 %
Brady Statistic AP VS Percent: 52 %
Brady Statistic AS VP Percent: 1 %
Brady Statistic AS VS Percent: 46 %
Brady Statistic RA Percent Paced: 53 %
Brady Statistic RV Percent Paced: 2.1 %
Date Time Interrogation Session: 20170109165521
Implantable Lead Implant Date: 20100908
Implantable Lead Implant Date: 20100908
Implantable Lead Location: 753860
Implantable Lead Model: 1948
Lead Channel Impedance Value: 540 Ohm
Lead Channel Pacing Threshold Amplitude: 0.625 V
Lead Channel Pacing Threshold Pulse Width: 0.4 ms
Lead Channel Pacing Threshold Pulse Width: 0.5 ms
Lead Channel Sensing Intrinsic Amplitude: 5 mV
Lead Channel Sensing Intrinsic Amplitude: 9.9 mV
Lead Channel Setting Pacing Amplitude: 1.375
Lead Channel Setting Pacing Pulse Width: 0.4 ms
Lead Channel Setting Sensing Sensitivity: 2 mV
MDC IDC LEAD LOCATION: 753859
MDC IDC LEAD MODEL: 1944
MDC IDC MSMT BATTERY REMAINING LONGEVITY: 113 mo
MDC IDC MSMT BATTERY REMAINING PERCENTAGE: 81 %
MDC IDC MSMT BATTERY VOLTAGE: 2.93 V
MDC IDC MSMT LEADCHNL RA IMPEDANCE VALUE: 440 Ohm
MDC IDC MSMT LEADCHNL RA PACING THRESHOLD AMPLITUDE: 0.375 V
MDC IDC SET LEADCHNL RV PACING AMPLITUDE: 0.875
Pulse Gen Model: 2110
Pulse Gen Serial Number: 2300232

## 2015-08-28 ENCOUNTER — Encounter: Payer: Self-pay | Admitting: Cardiology

## 2015-09-17 DIAGNOSIS — Z6826 Body mass index (BMI) 26.0-26.9, adult: Secondary | ICD-10-CM | POA: Diagnosis not present

## 2015-09-17 DIAGNOSIS — M545 Low back pain: Secondary | ICD-10-CM | POA: Diagnosis not present

## 2015-09-17 DIAGNOSIS — J440 Chronic obstructive pulmonary disease with acute lower respiratory infection: Secondary | ICD-10-CM | POA: Diagnosis not present

## 2015-10-11 ENCOUNTER — Ambulatory Visit (INDEPENDENT_AMBULATORY_CARE_PROVIDER_SITE_OTHER): Payer: PPO | Admitting: Cardiology

## 2015-10-11 ENCOUNTER — Encounter: Payer: Self-pay | Admitting: Cardiology

## 2015-10-11 VITALS — BP 112/60 | HR 80 | Ht 62.0 in | Wt 132.0 lb

## 2015-10-11 DIAGNOSIS — J449 Chronic obstructive pulmonary disease, unspecified: Secondary | ICD-10-CM | POA: Diagnosis not present

## 2015-10-11 DIAGNOSIS — I495 Sick sinus syndrome: Secondary | ICD-10-CM

## 2015-10-11 DIAGNOSIS — I10 Essential (primary) hypertension: Secondary | ICD-10-CM | POA: Diagnosis not present

## 2015-10-11 DIAGNOSIS — I38 Endocarditis, valve unspecified: Secondary | ICD-10-CM

## 2015-10-11 NOTE — Progress Notes (Signed)
Cardiology Office Note  Date: 10/11/2015   ID: Daniya Hermance, DOB 11/02/1945, MRN [id number]  PCP: Neale Burly, MD  Primary Cardiologist: Rozann Lesches, MD   Chief Complaint  Patient presents with  . Cardiac follow-up    History of Present Illness: Alyssah Ware is a 70 y.o. female last seen in February 2016. She presents for a routine follow-up visit. She has a history of mild aortic and mitral valve disease as outlined below, has been asymptomatic. She reports no recurrent exertional chest pain or dyspnea on exertion beyond NYHA class II. Her last echocardiogram was in 2014.  She continues to follow with Dr. Rayann Heman in the device clinic. Most recent remote device check showed normal function. She has a history of sick sinus syndrome. No dizziness or syncope.  She follows regularly with Dr. Sherrie Sport.  Past Medical History  Diagnosis Date  . Sick sinus syndrome (HCC)     s/p PPM (SJM)  . Neurodermatitis   . Syncope   . COPD with emphysema ([place])   . Depression   . GERD (gastroesophageal reflux disease)   . Peptic ulcer   . Mitral and aortic regurgitation     Mild  . Chronic pain   . Hypertension     Current Outpatient Prescriptions  Medication Sig Dispense Refill  . ALPRAZolam (XANAX) 1 MG tablet Take 1 mg by mouth 3 (three) times daily.      . baclofen (LIORESAL) 10 MG tablet Take 10 mg by mouth 2 (two) times daily as needed for muscle spasms.    Marland Kitchen gabapentin (NEURONTIN) 400 MG capsule Take 400 mg by mouth 3 (three) times daily.    Marland Kitchen ipratropium (ATROVENT) 0.02 % nebulizer solution Take 500 mcg by nebulization every 4 (four) hours.    Marland Kitchen morphine (MSIR) 15 MG tablet Take 15 mg by mouth 3 (three) times daily.      Marland Kitchen omeprazole (PRILOSEC) 20 MG capsule Take 1 capsule by mouth Twice daily.    . ranitidine (ZANTAC) 300 MG tablet Take 300 mg by mouth at bedtime.    . traZODone (DESYREL) 50 MG tablet Take 25 mg by mouth at bedtime as needed for sleep.     No current  facility-administered medications for this visit.   Allergies:  Review of patient's allergies indicates no known allergies.   Social History: The patient  reports that she quit smoking about 3 years ago. Her smoking use included Cigarettes. She started smoking about 51 years ago. She has a 80 pack-year smoking history. She has never used smokeless tobacco. She reports that she does not drink alcohol or use illicit drugs.   ROS:  Please see the history of present illness. Otherwise, complete review of systems is positive for reflux.  All other systems are reviewed and negative.   Physical Exam: VS:  BP 112/60 mmHg  Pulse 80  Ht 5\' 2"  (1.575 m)  Wt 132 lb (59.875 kg)  BMI 24.14 kg/m2  SpO2 91%, BMI Body mass index is 24.14 kg/(m^2).  Wt Readings from Last 3 Encounters:  10/11/15 132 lb (59.875 kg)  05/03/15 137 lb 1.9 oz (62.197 kg)  09/17/14 140 lb 12.8 oz (63.866 kg)    Comfortable at rest. HEENT: Conjunctiva and lids normal, oropharynx with moist mucosa.  Neck: Supple, no elevated JVP or carotid bruits.  Chest wall: Stable device pocket site on the left.  Cardiac: Regular rate and rhythm, no S3 gallop, is soft apical systolic murmur.  Lungs: Diminished breath  sounds throughout, nonlabored, no wheezing.  Extremities: No pitting edema.  ECG: I personally reviewed the prior tracing from 05/03/2015 which showed an atrial paced rhythm with nonspecific T-wave changes.  Other Studies Reviewed Today:  Cardiolite 05/30/2013: Lexiscan Sestamibi: Normal perfusion. LVEF was calculated at 72%.  Please s identification: Patient is a 70 year old with chest pain tests to evaluate, rule out ischemia.  Stress data: The patient underwent stress testing withLexiscan per protocol. Baseline EKG showed sinus rhythm 61 beats per min. Baseline blood pressure 121/63.  With the infusion of Lexiscan the patient developed no significant ST changes (less than 1 mm ST changes). She experienced  no chest pain. Overall electrically negative.  Nuclear data: The patient was studied in a 1 day rest stress protocol. Patient was injected with 10 mCi technetium 99 labeled sestamibi at rest, 30 mCi technetium 99 labeled sestamibi at stress. Images were reconstructed in short, vertical, horizontal axes.  In the initial stress images there was normal perfusion. In the recovery images there was normal perfusion.  On gating, LV EF was calculated at 72% with normal wall motion.  Echocardiogram 05/24/2013: Study Conclusions  - Left ventricle: The cavity size was normal. Wall thickness was normal. Systolic function was normal. The estimated ejection fraction was in the range of 60% to 65%. Wall motion was normal; there were no regional wall motion abnormalities. There was an increased relative contribution of atrial contraction to ventricular filling. Doppler parameters are consistent with abnormal left ventricular relaxation (grade 1 diastolic dysfunction). - Ventricular septum: Septal motion showed paradox. These changes are consistent with right ventricular pacing. - Aortic valve: Trileaflet; mildly thickened leaflets. Mild regurgitation. Valve area: 1.96cm^2(VTI). Valve area: 1.86cm^2 (Vmax). - Right ventricle: The cavity size was mildly dilated. Wall thickness was normal. Pacer wire or catheter noted in right ventricle. Systolic function was normal. - Right atrium: The atrium was mildly dilated. Pacer wire or catheter noted in right atrium. - Tricuspid valve: Mild regurgitation.  Assessment and Plan:  1. Aortic and mitral regurgitation, mild by echocardiogram in 2014. We will arrange a follow-up surveillance study, and if stable otherwise continue with clinical evaluations.  2. Sick sinus syndrome status post St. Jude pacemaker placement, followed by Dr. Rayann Heman. She continues with remote device transmissions.  3. History of essential  hypertension, blood pressure is normal today.  Current medicines were reviewed with the patient today.   Orders Placed This Encounter  Procedures  . ECHOCARDIOGRAM COMPLETE    Disposition: FU with me in 1 year.   Signed, Satira Sark, MD, [facility] 10/11/2015 1:48 PM    [place] at [place], [place], [place] [address] Phone: [phone]; Fax: [phone]

## 2015-10-11 NOTE — Patient Instructions (Signed)
Your physician recommends that you continue on your current medications as directed. Please refer to the Current Medication list given to you today. Your physician has requested that you have an echocardiogram. Echocardiography is a painless test that uses sound waves to create images of your heart. It provides your doctor with information about the size and shape of your heart and how well your heart's chambers and valves are working. This procedure takes approximately one hour. There are no restrictions for this procedure. Your physician recommends that you schedule a follow-up appointment in: 1 year. You will receive a reminder letter in the mail in about 10 months reminding you to call and schedule your appointment. If you don't receive this letter, please contact our office. 

## 2015-10-29 ENCOUNTER — Ambulatory Visit (INDEPENDENT_AMBULATORY_CARE_PROVIDER_SITE_OTHER): Payer: PPO | Admitting: *Deleted

## 2015-10-29 ENCOUNTER — Telehealth: Payer: Self-pay | Admitting: Cardiology

## 2015-10-29 DIAGNOSIS — I495 Sick sinus syndrome: Secondary | ICD-10-CM

## 2015-10-29 NOTE — Telephone Encounter (Signed)
Spoke with pt and reminded pt of remote transmission that is due today. Pt verbalized understanding.   

## 2015-10-29 NOTE — Progress Notes (Signed)
Remote pacemaker transmission.   

## 2015-11-07 ENCOUNTER — Ambulatory Visit (INDEPENDENT_AMBULATORY_CARE_PROVIDER_SITE_OTHER): Payer: PPO

## 2015-11-07 ENCOUNTER — Other Ambulatory Visit: Payer: Self-pay

## 2015-11-07 DIAGNOSIS — I38 Endocarditis, valve unspecified: Secondary | ICD-10-CM | POA: Diagnosis not present

## 2015-11-12 ENCOUNTER — Telehealth: Payer: Self-pay | Admitting: *Deleted

## 2015-11-13 NOTE — Telephone Encounter (Signed)
Patient informed. 

## 2015-11-13 NOTE — Telephone Encounter (Signed)
Please let her know that LVEF remains stable, and there has been no significant change in her valvular status which is overall reassuring. We will continue to follow on examination for now.

## 2015-11-19 DIAGNOSIS — M545 Low back pain: Secondary | ICD-10-CM | POA: Diagnosis not present

## 2015-11-19 DIAGNOSIS — Z6826 Body mass index (BMI) 26.0-26.9, adult: Secondary | ICD-10-CM | POA: Diagnosis not present

## 2015-11-19 DIAGNOSIS — J440 Chronic obstructive pulmonary disease with acute lower respiratory infection: Secondary | ICD-10-CM | POA: Diagnosis not present

## 2015-12-19 ENCOUNTER — Encounter: Payer: Self-pay | Admitting: Cardiology

## 2015-12-19 LAB — CUP PACEART REMOTE DEVICE CHECK
Battery Remaining Percentage: 73 %
Battery Voltage: 2.92 V
Brady Statistic AP VP Percent: 1.5 %
Brady Statistic AS VP Percent: 1 %
Brady Statistic AS VS Percent: 52 %
Brady Statistic RA Percent Paced: 47 %
Date Time Interrogation Session: 20170404181613
Implantable Lead Implant Date: 20100908
Implantable Lead Model: 1944
Implantable Lead Model: 1948
Lead Channel Impedance Value: 430 Ohm
Lead Channel Pacing Threshold Amplitude: 0.5 V
Lead Channel Pacing Threshold Amplitude: 0.5 V
Lead Channel Setting Pacing Amplitude: 0.75 V
Lead Channel Setting Pacing Pulse Width: 0.4 ms
MDC IDC LEAD IMPLANT DT: 20100908
MDC IDC LEAD LOCATION: 753859
MDC IDC LEAD LOCATION: 753860
MDC IDC MSMT BATTERY REMAINING LONGEVITY: 101 mo
MDC IDC MSMT LEADCHNL RA PACING THRESHOLD PULSEWIDTH: 0.5 ms
MDC IDC MSMT LEADCHNL RA SENSING INTR AMPL: 4.2 mV
MDC IDC MSMT LEADCHNL RV IMPEDANCE VALUE: 750 Ohm
MDC IDC MSMT LEADCHNL RV PACING THRESHOLD PULSEWIDTH: 0.4 ms
MDC IDC MSMT LEADCHNL RV SENSING INTR AMPL: 11 mV
MDC IDC PG SERIAL: 2300232
MDC IDC SET LEADCHNL RA PACING AMPLITUDE: 1.5 V
MDC IDC SET LEADCHNL RV SENSING SENSITIVITY: 2 mV
MDC IDC STAT BRADY AP VS PERCENT: 47 %
MDC IDC STAT BRADY RV PERCENT PACED: 1.5 %
Pulse Gen Model: 2110

## 2016-01-23 DIAGNOSIS — L89151 Pressure ulcer of sacral region, stage 1: Secondary | ICD-10-CM | POA: Diagnosis not present

## 2016-01-23 DIAGNOSIS — I10 Essential (primary) hypertension: Secondary | ICD-10-CM | POA: Diagnosis not present

## 2016-01-23 DIAGNOSIS — J440 Chronic obstructive pulmonary disease with acute lower respiratory infection: Secondary | ICD-10-CM | POA: Diagnosis not present

## 2016-01-23 DIAGNOSIS — Z6826 Body mass index (BMI) 26.0-26.9, adult: Secondary | ICD-10-CM | POA: Diagnosis not present

## 2016-01-23 DIAGNOSIS — M545 Low back pain: Secondary | ICD-10-CM | POA: Diagnosis not present

## 2016-01-29 ENCOUNTER — Ambulatory Visit (INDEPENDENT_AMBULATORY_CARE_PROVIDER_SITE_OTHER): Payer: PPO | Admitting: *Deleted

## 2016-01-29 DIAGNOSIS — I495 Sick sinus syndrome: Secondary | ICD-10-CM | POA: Diagnosis not present

## 2016-01-29 NOTE — Progress Notes (Signed)
Remote pacemaker transmission.   

## 2016-02-05 ENCOUNTER — Encounter: Payer: Self-pay | Admitting: Cardiology

## 2016-02-05 LAB — CUP PACEART REMOTE DEVICE CHECK
Battery Voltage: 2.9 V
Brady Statistic AP VP Percent: 1.9 %
Brady Statistic AS VP Percent: 1 %
Brady Statistic AS VS Percent: 50 %
Brady Statistic RA Percent Paced: 49 %
Brady Statistic RV Percent Paced: 1.9 %
Implantable Lead Implant Date: 20100908
Implantable Lead Implant Date: 20100908
Implantable Lead Location: 753859
Implantable Lead Location: 753860
Implantable Lead Model: 1944
Lead Channel Impedance Value: 600 Ohm
Lead Channel Pacing Threshold Amplitude: 0.625 V
Lead Channel Pacing Threshold Amplitude: 0.625 V
Lead Channel Pacing Threshold Pulse Width: 0.4 ms
Lead Channel Sensing Intrinsic Amplitude: 11.3 mV
Lead Channel Setting Pacing Amplitude: 0.875
Lead Channel Setting Pacing Amplitude: 1.625
Lead Channel Setting Pacing Pulse Width: 0.4 ms
MDC IDC LEAD MODEL: 1948
MDC IDC MSMT BATTERY REMAINING LONGEVITY: 90 mo
MDC IDC MSMT BATTERY REMAINING PERCENTAGE: 65 %
MDC IDC MSMT LEADCHNL RA IMPEDANCE VALUE: 480 Ohm
MDC IDC MSMT LEADCHNL RA PACING THRESHOLD PULSEWIDTH: 0.5 ms
MDC IDC MSMT LEADCHNL RA SENSING INTR AMPL: 4.4 mV
MDC IDC SESS DTM: 20170705133149
MDC IDC SET LEADCHNL RV SENSING SENSITIVITY: 2 mV
MDC IDC STAT BRADY AP VS PERCENT: 48 %
Pulse Gen Serial Number: 2300232

## 2016-03-26 DIAGNOSIS — Z6826 Body mass index (BMI) 26.0-26.9, adult: Secondary | ICD-10-CM | POA: Diagnosis not present

## 2016-03-26 DIAGNOSIS — L89151 Pressure ulcer of sacral region, stage 1: Secondary | ICD-10-CM | POA: Diagnosis not present

## 2016-03-26 DIAGNOSIS — J440 Chronic obstructive pulmonary disease with acute lower respiratory infection: Secondary | ICD-10-CM | POA: Diagnosis not present

## 2016-03-26 DIAGNOSIS — I10 Essential (primary) hypertension: Secondary | ICD-10-CM | POA: Diagnosis not present

## 2016-03-26 DIAGNOSIS — M545 Low back pain: Secondary | ICD-10-CM | POA: Diagnosis not present

## 2016-03-27 DIAGNOSIS — I10 Essential (primary) hypertension: Secondary | ICD-10-CM | POA: Diagnosis not present

## 2016-03-27 DIAGNOSIS — J441 Chronic obstructive pulmonary disease with (acute) exacerbation: Secondary | ICD-10-CM | POA: Diagnosis not present

## 2016-03-27 DIAGNOSIS — M545 Low back pain: Secondary | ICD-10-CM | POA: Diagnosis not present

## 2016-04-24 ENCOUNTER — Encounter: Payer: Self-pay | Admitting: Internal Medicine

## 2016-04-24 ENCOUNTER — Ambulatory Visit (INDEPENDENT_AMBULATORY_CARE_PROVIDER_SITE_OTHER): Payer: PPO | Admitting: Internal Medicine

## 2016-04-24 VITALS — BP 102/72 | HR 70 | Ht 62.0 in | Wt 145.0 lb

## 2016-04-24 DIAGNOSIS — I495 Sick sinus syndrome: Secondary | ICD-10-CM | POA: Diagnosis not present

## 2016-04-24 DIAGNOSIS — R079 Chest pain, unspecified: Secondary | ICD-10-CM | POA: Diagnosis not present

## 2016-04-24 DIAGNOSIS — I10 Essential (primary) hypertension: Secondary | ICD-10-CM | POA: Diagnosis not present

## 2016-04-24 DIAGNOSIS — Z950 Presence of cardiac pacemaker: Secondary | ICD-10-CM

## 2016-04-24 LAB — CUP PACEART INCLINIC DEVICE CHECK
Battery Voltage: 2.89 V
Brady Statistic RA Percent Paced: 50 %
Implantable Lead Implant Date: 20100908
Implantable Lead Location: 753859
Implantable Lead Location: 753860
Implantable Lead Model: 1944
Lead Channel Impedance Value: 462.5 Ohm
Lead Channel Impedance Value: 625 Ohm
Lead Channel Pacing Threshold Amplitude: 0.5 V
Lead Channel Pacing Threshold Pulse Width: 0.4 ms
Lead Channel Sensing Intrinsic Amplitude: 4.2 mV
Lead Channel Setting Pacing Pulse Width: 0.4 ms
MDC IDC LEAD IMPLANT DT: 20100908
MDC IDC LEAD MODEL: 1948
MDC IDC MSMT BATTERY REMAINING LONGEVITY: 80.4
MDC IDC MSMT LEADCHNL RA PACING THRESHOLD AMPLITUDE: 0.5 V
MDC IDC MSMT LEADCHNL RA PACING THRESHOLD PULSEWIDTH: 0.5 ms
MDC IDC MSMT LEADCHNL RV SENSING INTR AMPL: 11.4 mV
MDC IDC SESS DTM: 20170929153432
MDC IDC SET LEADCHNL RA PACING AMPLITUDE: 1.5 V
MDC IDC SET LEADCHNL RV PACING AMPLITUDE: 0.75 V
MDC IDC SET LEADCHNL RV SENSING SENSITIVITY: 2 mV
MDC IDC STAT BRADY RV PERCENT PACED: 1.7 %
Pulse Gen Model: 2110
Pulse Gen Serial Number: 2300232

## 2016-04-24 NOTE — Patient Instructions (Addendum)
Medication Instructions:  Continue all current medications.  Labwork: none  Testing/Procedures: none  Follow-Up: Your physician wants you to follow up in:  1 year.  You will receive a reminder letter in the mail one-two months in advance.  If you don't receive a letter, please call our office to schedule the follow up appointment   Any Other Special Instructions Will Be Listed Below (If Applicable). Remote monitoring is used to monitor your Pacemaker of ICD from home. This monitoring reduces the number of office visits required to check your device to one time per year. It allows us to keep an eye on the functioning of your device to ensure it is working properly. You are scheduled for a device check from home on 07/28/2016. You may send your transmission at any time that day. If you have a wireless device, the transmission will be sent automatically. After your physician reviews your transmission, you will receive a postcard with your next transmission date.  If you need a refill on your cardiac medications before your next appointment, please call your pharmacy.  

## 2016-04-24 NOTE — Progress Notes (Signed)
PCP: Neale Burly, MD Primary Cardiologist:  Dr Domenic Polite  The patient presents today for routine electrophysiology followup.  Since last being seen in our clinic, the patient reports doing reasonably well.   Her chest pain is stable.  Today, she denies symptoms of palpitations, orthopnea, PND, lower extremity edema, dizziness, presyncope, syncope, or neurologic sequela.  The patient feels that she is tolerating medications without difficulties and is otherwise without complaint today.   Past Medical History:  Diagnosis Date  . Chronic pain   . COPD with emphysema ([place])   . Depression   . GERD (gastroesophageal reflux disease)   . Hypertension   . Mitral and aortic regurgitation    Mild  . Neurodermatitis   . Peptic ulcer   . Sick sinus syndrome (HCC)    s/p PPM (SJM)  . Syncope    Past Surgical History:  Procedure Laterality Date  . APPENDECTOMY    . CARPAL TUNNEL RELEASE    . CHOLECYSTECTOMY    . NISSEN FUNDOPLICATION    . PACEMAKER INSERTION     St Jude  . SHOULDER SURGERY    . SPLENECTOMY      Current Outpatient Prescriptions  Medication Sig Dispense Refill  . ALPRAZolam (XANAX) 1 MG tablet Take 1 mg by mouth 3 (three) times daily.      . baclofen (LIORESAL) 10 MG tablet Take 10 mg by mouth 2 (two) times daily as needed for muscle spasms.    Marland Kitchen gabapentin (NEURONTIN) 400 MG capsule Take 400 mg by mouth 3 (three) times daily.    Marland Kitchen ipratropium (ATROVENT) 0.02 % nebulizer solution Take 500 mcg by nebulization every 4 (four) hours.    Marland Kitchen morphine (MSIR) 15 MG tablet Take 15 mg by mouth 3 (three) times daily.      Marland Kitchen omeprazole (PRILOSEC) 20 MG capsule Take 1 capsule by mouth Twice daily.    . traZODone (DESYREL) 50 MG tablet Take 25 mg by mouth at bedtime as needed for sleep.     No current facility-administered medications for this visit.     No Known Allergies  Social History   Social History  . Marital status: Married    Spouse name: N/A  . Number of children:  1  . Years of education: N/A   Occupational History  . Not on file.   Social History Main Topics  . Smoking status: Former Smoker    Packs/day: 2.00    Years: 40.00    Types: Cigarettes    Start date: 11/13/1963    Quit date: 08/18/2012  . Smokeless tobacco: Never Used     Comment: I am pleased that she recently quit.  . Alcohol use No     Comment: Former heavy drinker  . Drug use: No     Comment: Former benodiazepine dependence  . Sexual activity: Not on file   Other Topics Concern  . Not on file   Social History Narrative  . No narrative on file   Physical Exam: Vitals:   04/24/16 1126  BP: 102/72  Pulse: 70  SpO2: 93%  Weight: 145 lb (65.8 kg)  Height: 5\' 2"  (1.575 m)    GEN- The patient is well appearing, alert and oriented x 3 today.   Head- normocephalic, atraumatic Eyes-  Sclera clear, conjunctiva pink Ears- hearing intact Oropharynx- clear Neck- supple,   Lungs- Clear to ausculation bilaterally, normal work of breathing Chest- pacemaker pocket is well healed.  Her L chest wal (axilla) is  diffusely ttp and reproduces her chest wall pain Heart- Regular rate and rhythm, no murmurs, rubs or gallops, PMI not laterally displaced GI- soft, NT, ND, + BS Extremities- no clubbing, cyanosis, or edema  Pacemaker interrogation- reviewed in detail today,  See PACEART report  Recent echo is reviewed  Assessment and Plan:  1. Sinus bradycardia Normal pacemaker function See Pace Art report No changes today  2. Chest pain Chronic atypical  3. Tobacco  She remains quit since 1/14!  I have congratulated her again today.  Return to the device clinic in 1 year [place]lin every 3 months  Thompson Grayer MD, [facility] 04/24/2016 12:04 PM

## 2016-05-01 ENCOUNTER — Encounter: Payer: Medicare Other | Admitting: Internal Medicine

## 2016-05-20 DIAGNOSIS — M545 Low back pain: Secondary | ICD-10-CM | POA: Diagnosis not present

## 2016-05-20 DIAGNOSIS — J441 Chronic obstructive pulmonary disease with (acute) exacerbation: Secondary | ICD-10-CM | POA: Diagnosis not present

## 2016-05-20 DIAGNOSIS — I10 Essential (primary) hypertension: Secondary | ICD-10-CM | POA: Diagnosis not present

## 2016-05-26 DIAGNOSIS — Z6826 Body mass index (BMI) 26.0-26.9, adult: Secondary | ICD-10-CM | POA: Diagnosis not present

## 2016-05-26 DIAGNOSIS — I10 Essential (primary) hypertension: Secondary | ICD-10-CM | POA: Diagnosis not present

## 2016-05-26 DIAGNOSIS — M545 Low back pain: Secondary | ICD-10-CM | POA: Diagnosis not present

## 2016-05-26 DIAGNOSIS — L89151 Pressure ulcer of sacral region, stage 1: Secondary | ICD-10-CM | POA: Diagnosis not present

## 2016-05-26 DIAGNOSIS — J440 Chronic obstructive pulmonary disease with acute lower respiratory infection: Secondary | ICD-10-CM | POA: Diagnosis not present

## 2016-05-26 DIAGNOSIS — Z0000 Encounter for general adult medical examination without abnormal findings: Secondary | ICD-10-CM | POA: Diagnosis not present

## 2016-05-26 DIAGNOSIS — Z1389 Encounter for screening for other disorder: Secondary | ICD-10-CM | POA: Diagnosis not present

## 2016-06-05 DIAGNOSIS — Z1231 Encounter for screening mammogram for malignant neoplasm of breast: Secondary | ICD-10-CM | POA: Diagnosis not present

## 2016-06-15 DIAGNOSIS — J441 Chronic obstructive pulmonary disease with (acute) exacerbation: Secondary | ICD-10-CM | POA: Diagnosis not present

## 2016-06-15 DIAGNOSIS — M545 Low back pain: Secondary | ICD-10-CM | POA: Diagnosis not present

## 2016-06-15 DIAGNOSIS — I10 Essential (primary) hypertension: Secondary | ICD-10-CM | POA: Diagnosis not present

## 2016-06-25 DIAGNOSIS — F411 Generalized anxiety disorder: Secondary | ICD-10-CM | POA: Diagnosis not present

## 2016-06-25 DIAGNOSIS — I10 Essential (primary) hypertension: Secondary | ICD-10-CM | POA: Diagnosis not present

## 2016-06-25 DIAGNOSIS — Z6824 Body mass index (BMI) 24.0-24.9, adult: Secondary | ICD-10-CM | POA: Diagnosis not present

## 2016-06-25 DIAGNOSIS — M545 Low back pain: Secondary | ICD-10-CM | POA: Diagnosis not present

## 2016-07-09 DIAGNOSIS — Z8262 Family history of osteoporosis: Secondary | ICD-10-CM | POA: Diagnosis not present

## 2016-07-09 DIAGNOSIS — M542 Cervicalgia: Secondary | ICD-10-CM | POA: Diagnosis not present

## 2016-07-09 DIAGNOSIS — Z780 Asymptomatic menopausal state: Secondary | ICD-10-CM | POA: Diagnosis not present

## 2016-07-09 DIAGNOSIS — J449 Chronic obstructive pulmonary disease, unspecified: Secondary | ICD-10-CM | POA: Diagnosis not present

## 2016-07-09 DIAGNOSIS — Z79899 Other long term (current) drug therapy: Secondary | ICD-10-CM | POA: Diagnosis not present

## 2016-07-09 DIAGNOSIS — M810 Age-related osteoporosis without current pathological fracture: Secondary | ICD-10-CM | POA: Diagnosis not present

## 2016-07-09 DIAGNOSIS — Z87891 Personal history of nicotine dependence: Secondary | ICD-10-CM | POA: Diagnosis not present

## 2016-07-09 DIAGNOSIS — F329 Major depressive disorder, single episode, unspecified: Secondary | ICD-10-CM | POA: Diagnosis not present

## 2016-07-09 DIAGNOSIS — M549 Dorsalgia, unspecified: Secondary | ICD-10-CM | POA: Diagnosis not present

## 2016-07-14 DIAGNOSIS — J441 Chronic obstructive pulmonary disease with (acute) exacerbation: Secondary | ICD-10-CM | POA: Diagnosis not present

## 2016-07-14 DIAGNOSIS — M545 Low back pain: Secondary | ICD-10-CM | POA: Diagnosis not present

## 2016-07-14 DIAGNOSIS — I10 Essential (primary) hypertension: Secondary | ICD-10-CM | POA: Diagnosis not present

## 2016-07-28 ENCOUNTER — Telehealth: Payer: Self-pay | Admitting: Cardiology

## 2016-07-28 ENCOUNTER — Ambulatory Visit (INDEPENDENT_AMBULATORY_CARE_PROVIDER_SITE_OTHER): Payer: PPO | Admitting: *Deleted

## 2016-07-28 DIAGNOSIS — I495 Sick sinus syndrome: Secondary | ICD-10-CM | POA: Diagnosis not present

## 2016-07-28 DIAGNOSIS — Z6824 Body mass index (BMI) 24.0-24.9, adult: Secondary | ICD-10-CM | POA: Diagnosis not present

## 2016-07-28 DIAGNOSIS — L03818 Cellulitis of other sites: Secondary | ICD-10-CM | POA: Diagnosis not present

## 2016-07-28 NOTE — Telephone Encounter (Signed)
Spoke with pt and reminded pt of remote transmission that is due today. Pt verbalized understanding.   

## 2016-07-30 NOTE — Progress Notes (Signed)
Remote pacemaker transmission.   

## 2016-07-31 ENCOUNTER — Encounter: Payer: Self-pay | Admitting: Cardiology

## 2016-07-31 LAB — CUP PACEART REMOTE DEVICE CHECK
Battery Remaining Percentage: 65 %
Brady Statistic AP VP Percent: 2.2 %
Brady Statistic AP VS Percent: 58 %
Brady Statistic AS VP Percent: 1 %
Brady Statistic RA Percent Paced: 60 %
Brady Statistic RV Percent Paced: 2.2 %
Date Time Interrogation Session: 20180102185626
Implantable Lead Implant Date: 20100908
Implantable Lead Location: 753860
Implantable Lead Model: 1944
Lead Channel Impedance Value: 600 Ohm
Lead Channel Pacing Threshold Amplitude: 0.5 V
Lead Channel Pacing Threshold Pulse Width: 0.5 ms
Lead Channel Sensing Intrinsic Amplitude: 11.7 mV
Lead Channel Setting Pacing Amplitude: 0.75 V
Lead Channel Setting Pacing Amplitude: 1.625
Lead Channel Setting Pacing Pulse Width: 0.4 ms
Lead Channel Setting Sensing Sensitivity: 3 mV
MDC IDC LEAD IMPLANT DT: 20100908
MDC IDC LEAD LOCATION: 753859
MDC IDC LEAD MODEL: 1948
MDC IDC MSMT BATTERY REMAINING LONGEVITY: 91 mo
MDC IDC MSMT BATTERY VOLTAGE: 2.9 V
MDC IDC MSMT LEADCHNL RA IMPEDANCE VALUE: 460 Ohm
MDC IDC MSMT LEADCHNL RA PACING THRESHOLD AMPLITUDE: 0.625 V
MDC IDC MSMT LEADCHNL RA SENSING INTR AMPL: 4.3 mV
MDC IDC MSMT LEADCHNL RV PACING THRESHOLD PULSEWIDTH: 0.4 ms
MDC IDC PG IMPLANT DT: 20100908
MDC IDC PG SERIAL: 2300232
MDC IDC STAT BRADY AS VS PERCENT: 39 %

## 2016-08-04 DIAGNOSIS — F411 Generalized anxiety disorder: Secondary | ICD-10-CM | POA: Diagnosis not present

## 2016-08-04 DIAGNOSIS — I10 Essential (primary) hypertension: Secondary | ICD-10-CM | POA: Diagnosis not present

## 2016-08-04 DIAGNOSIS — M545 Low back pain: Secondary | ICD-10-CM | POA: Diagnosis not present

## 2016-09-29 DIAGNOSIS — M545 Low back pain: Secondary | ICD-10-CM | POA: Diagnosis not present

## 2016-09-29 DIAGNOSIS — I10 Essential (primary) hypertension: Secondary | ICD-10-CM | POA: Diagnosis not present

## 2016-09-29 DIAGNOSIS — F411 Generalized anxiety disorder: Secondary | ICD-10-CM | POA: Diagnosis not present

## 2016-10-14 DIAGNOSIS — S161XXA Strain of muscle, fascia and tendon at neck level, initial encounter: Secondary | ICD-10-CM | POA: Diagnosis not present

## 2016-10-14 DIAGNOSIS — Z87891 Personal history of nicotine dependence: Secondary | ICD-10-CM | POA: Diagnosis not present

## 2016-10-14 DIAGNOSIS — K219 Gastro-esophageal reflux disease without esophagitis: Secondary | ICD-10-CM | POA: Diagnosis not present

## 2016-10-14 DIAGNOSIS — Z79899 Other long term (current) drug therapy: Secondary | ICD-10-CM | POA: Diagnosis not present

## 2016-10-14 DIAGNOSIS — M542 Cervicalgia: Secondary | ICD-10-CM | POA: Diagnosis not present

## 2016-10-14 DIAGNOSIS — F329 Major depressive disorder, single episode, unspecified: Secondary | ICD-10-CM | POA: Diagnosis not present

## 2016-10-26 DIAGNOSIS — L2089 Other atopic dermatitis: Secondary | ICD-10-CM | POA: Diagnosis not present

## 2016-10-26 DIAGNOSIS — L259 Unspecified contact dermatitis, unspecified cause: Secondary | ICD-10-CM | POA: Diagnosis not present

## 2016-10-26 DIAGNOSIS — Z6824 Body mass index (BMI) 24.0-24.9, adult: Secondary | ICD-10-CM | POA: Diagnosis not present

## 2016-10-26 DIAGNOSIS — M545 Low back pain: Secondary | ICD-10-CM | POA: Diagnosis not present

## 2016-10-26 DIAGNOSIS — L03818 Cellulitis of other sites: Secondary | ICD-10-CM | POA: Diagnosis not present

## 2016-10-27 ENCOUNTER — Ambulatory Visit (INDEPENDENT_AMBULATORY_CARE_PROVIDER_SITE_OTHER): Payer: PPO | Admitting: *Deleted

## 2016-10-27 DIAGNOSIS — I495 Sick sinus syndrome: Secondary | ICD-10-CM

## 2016-10-27 NOTE — Progress Notes (Signed)
Remote pacemaker transmission.   

## 2016-10-28 ENCOUNTER — Encounter: Payer: Self-pay | Admitting: Cardiology

## 2016-10-28 LAB — CUP PACEART REMOTE DEVICE CHECK
Battery Remaining Longevity: 80 mo
Brady Statistic AP VP Percent: 2.4 %
Brady Statistic AS VP Percent: 1 %
Brady Statistic RA Percent Paced: 60 %
Date Time Interrogation Session: 20180403135049
Implantable Lead Implant Date: 20100908
Implantable Lead Location: 753859
Implantable Lead Model: 1944
Lead Channel Pacing Threshold Pulse Width: 0.4 ms
Lead Channel Pacing Threshold Pulse Width: 0.5 ms
Lead Channel Sensing Intrinsic Amplitude: 11.1 mV
Lead Channel Setting Pacing Amplitude: 0.75 V
Lead Channel Setting Pacing Amplitude: 1.625
Lead Channel Setting Pacing Pulse Width: 0.4 ms
MDC IDC LEAD IMPLANT DT: 20100908
MDC IDC LEAD LOCATION: 753860
MDC IDC MSMT BATTERY REMAINING PERCENTAGE: 57 %
MDC IDC MSMT BATTERY VOLTAGE: 2.89 V
MDC IDC MSMT LEADCHNL RA IMPEDANCE VALUE: 460 Ohm
MDC IDC MSMT LEADCHNL RA PACING THRESHOLD AMPLITUDE: 0.625 V
MDC IDC MSMT LEADCHNL RA SENSING INTR AMPL: 4.5 mV
MDC IDC MSMT LEADCHNL RV IMPEDANCE VALUE: 600 Ohm
MDC IDC MSMT LEADCHNL RV PACING THRESHOLD AMPLITUDE: 0.5 V
MDC IDC PG IMPLANT DT: 20100908
MDC IDC SET LEADCHNL RV SENSING SENSITIVITY: 3 mV
MDC IDC STAT BRADY AP VS PERCENT: 58 %
MDC IDC STAT BRADY AS VS PERCENT: 39 %
MDC IDC STAT BRADY RV PERCENT PACED: 2.4 %
Pulse Gen Model: 2110
Pulse Gen Serial Number: 2300232

## 2016-11-16 DIAGNOSIS — M545 Low back pain: Secondary | ICD-10-CM | POA: Diagnosis not present

## 2016-11-16 DIAGNOSIS — I10 Essential (primary) hypertension: Secondary | ICD-10-CM | POA: Diagnosis not present

## 2016-11-16 DIAGNOSIS — J441 Chronic obstructive pulmonary disease with (acute) exacerbation: Secondary | ICD-10-CM | POA: Diagnosis not present

## 2016-11-23 NOTE — Progress Notes (Signed)
Cardiology Office Note  Date: 11/24/2016   ID: [name], DOB [birth year], MRN [id number]  PCP: Neale Burly, MD  Primary Cardiologist: Rozann Lesches, MD   Chief Complaint  Patient presents with  . Cardiac follow-up    History of Present Illness: [name] is a 71 y.o. female last seen in March 2017. She presents for a routine follow-up visit. She describes occasional brief palpitations, no lightheadedness or syncope.  She continues to follow in the device clinic with Dr. Rayann Heman, [place] pacemaker in place. Recent interrogation showed normal device function. I personally reviewed her ECG today which shows sinus rhythm with nonspecific ST changes.  She had a follow-up echocardiogram done last year, results outlined below.  Past Medical History:  Diagnosis Date  . Chronic pain   . COPD with emphysema ([place])   . Depression   . GERD (gastroesophageal reflux disease)   . Hypertension   . Mitral and aortic regurgitation    Mild  . Neurodermatitis   . Peptic ulcer   . Sick sinus syndrome ([facility])    s/p PPM ([facility])  . Syncope     Past Surgical History:  Procedure Laterality Date  . APPENDECTOMY    . CARPAL TUNNEL RELEASE    . CHOLECYSTECTOMY    . NISSEN FUNDOPLICATION    . PACEMAKER INSERTION     [employer]  . SHOULDER SURGERY    . SPLENECTOMY      Current Outpatient Prescriptions  Medication Sig Dispense Refill  . alendronate (FOSAMAX) 70 MG tablet Take 1 tablet by mouth once a week.    . ALPRAZolam (XANAX) 1 MG tablet Take 1 mg by mouth 3 (three) times daily.      . baclofen (LIORESAL) 10 MG tablet Take 10 mg by mouth 2 (two) times daily as needed for muscle spasms.    . folic acid (FOLVITE) 1 MG tablet Take 1 mg by mouth daily.  1  . gabapentin (NEURONTIN) 400 MG capsule Take 400 mg by mouth 3 (three) times daily.    Marland Kitchen ipratropium (ATROVENT) 0.02 % nebulizer solution Take 500 mcg by nebulization every 4 (four) hours.    . methotrexate 2.5 MG tablet Take 10 mg  by mouth once a week.    . morphine (MSIR) 15 MG tablet Take 15 mg by mouth 3 (three) times daily.      Marland Kitchen omeprazole (PRILOSEC) 20 MG capsule Take 1 capsule by mouth Twice daily.    Marland Kitchen SM VITAMIN D3 1000 units tablet Take 1,000 Units by mouth daily.  0  . traZODone (DESYREL) 50 MG tablet Take 25 mg by mouth at bedtime as needed for sleep.     No current facility-administered medications for this visit.    Allergies:  Patient has no known allergies.   Social History: The patient  reports that she quit smoking about 4 years ago. Her smoking use included Cigarettes. She started smoking about 53 years ago. She has a 80.00 pack-year smoking history. She has never used smokeless tobacco. She reports that she does not drink alcohol or use drugs.   ROS:  Please see the history of present illness. Otherwise, complete review of systems is positive for recent rash on her arms.  All other systems are reviewed and negative.   Physical Exam: VS:  BP 107/68   Pulse 83   Ht 5\' 2"  (1.575 m)   Wt 127 lb 6.4 oz (57.8 kg)   SpO2 96%   BMI 23.30  kg/m , BMI Body mass index is 23.3 kg/m.  Wt Readings from Last 3 Encounters:  11/24/16 127 lb 6.4 oz (57.8 kg)  04/24/16 145 lb (65.8 kg)  10/11/15 132 lb (59.9 kg)    Comfortable at rest. HEENT: Conjunctiva and lids normal, oropharynx with moist mucosa.  Neck: Supple, no elevated JVP or carotid bruits.  Chest wall: Stable device pocket site on the left.  Cardiac: Regular rate and rhythm, no S3 gallop, is soft apical systolic murmur.  Lungs: Diminished breath sounds throughout, nonlabored, no wheezing.  Extremities: No pitting edema.  ECG: I personally reviewed the tracing from 05/02/2016 which showed an atrial paced rhythm with nonspecific T-wave changes.  Other Studies Reviewed Today:  Echocardiogram 11/07/2015: Study Conclusions  - Left ventricle: The cavity size was normal. Wall thickness was   normal. Systolic function was normal. The  estimated ejection   fraction was in the range of 55% to 60%. Wall motion was normal;   there were no regional wall motion abnormalities. Doppler   parameters are consistent with abnormal left ventricular   relaxation (grade 1 diastolic dysfunction). - Aortic valve: Mildly calcified annulus. Trileaflet; mildly   calcified leaflets. There was trivial regurgitation. - Mitral valve: Calcified annulus. There was trivial regurgitation. - Right ventricle: Pacer wire or catheter noted in right ventricle. - Right atrium: Central venous pressure (est): 3 mm Hg. - Tricuspid valve: There was mild regurgitation. - Pulmonary arteries: PA peak pressure: 30 mm Hg (S). - Pericardium, extracardiac: A prominent pericardial fat pad was   present.  Impressions:  - Normal LV wall thickness with LVEF 55-60%. Grade 1 diastolic   dysfunction with normal estimated LV filling pressure. Trivial   mitral regurgitation. Mildly sclerotic aortic valve with trivial   aortic regurgitation. Mild tricuspid regurgitation with PASP 30   mmHg. Device wire present in right heart.  Assessment and Plan:  1. History of sick sinus syndrome status post [employer] pacemaker. She follows with Dr. Rayann Heman. Recent device interrogation normal. ECG reviewed.  2. History of valvular heart disease, overall mild as outlined above and asymptomatic.  3. Essential hypertension, blood pressure is normal today. She is not requiring any specific antihypertensives at this time.  Current medicines were reviewed with the patient today.   Orders Placed This Encounter  Procedures  . EKG 12-Lead    Disposition: Follow-up in one year.  Signed, Satira Sark, MD, [facility] 11/24/2016 2:24 PM    [facility] [place], [place], [place] [address] Phone: [phone]; Fax: [phone]

## 2016-11-24 ENCOUNTER — Ambulatory Visit (INDEPENDENT_AMBULATORY_CARE_PROVIDER_SITE_OTHER): Payer: PPO | Admitting: Cardiology

## 2016-11-24 ENCOUNTER — Encounter: Payer: Self-pay | Admitting: Cardiology

## 2016-11-24 VITALS — BP 107/68 | HR 83 | Ht 62.0 in | Wt 127.4 lb

## 2016-11-24 DIAGNOSIS — I38 Endocarditis, valve unspecified: Secondary | ICD-10-CM

## 2016-11-24 DIAGNOSIS — I495 Sick sinus syndrome: Secondary | ICD-10-CM | POA: Diagnosis not present

## 2016-11-24 DIAGNOSIS — I10 Essential (primary) hypertension: Secondary | ICD-10-CM

## 2016-11-24 NOTE — Patient Instructions (Signed)

## 2016-12-11 DIAGNOSIS — M545 Low back pain: Secondary | ICD-10-CM | POA: Diagnosis not present

## 2016-12-11 DIAGNOSIS — Z6823 Body mass index (BMI) 23.0-23.9, adult: Secondary | ICD-10-CM | POA: Diagnosis not present

## 2016-12-11 DIAGNOSIS — R1319 Other dysphagia: Secondary | ICD-10-CM | POA: Diagnosis not present

## 2016-12-11 DIAGNOSIS — F411 Generalized anxiety disorder: Secondary | ICD-10-CM | POA: Diagnosis not present

## 2016-12-15 ENCOUNTER — Encounter (INDEPENDENT_AMBULATORY_CARE_PROVIDER_SITE_OTHER): Payer: Self-pay | Admitting: Internal Medicine

## 2016-12-15 ENCOUNTER — Ambulatory Visit (INDEPENDENT_AMBULATORY_CARE_PROVIDER_SITE_OTHER): Payer: PPO | Admitting: Internal Medicine

## 2016-12-15 ENCOUNTER — Encounter (INDEPENDENT_AMBULATORY_CARE_PROVIDER_SITE_OTHER): Payer: Self-pay | Admitting: *Deleted

## 2016-12-15 VITALS — BP 108/62 | HR 67 | Temp 98.1°F | Resp 18 | Ht 62.0 in | Wt 130.5 lb

## 2016-12-15 DIAGNOSIS — R1310 Dysphagia, unspecified: Secondary | ICD-10-CM | POA: Diagnosis not present

## 2016-12-15 DIAGNOSIS — R1319 Other dysphagia: Secondary | ICD-10-CM

## 2016-12-15 NOTE — Progress Notes (Addendum)
   Subjective:    Patient ID: [name], female    DOB: 1945/09/02, 71 y.o.   MRN: [id number]  HPIReferred by Dr. Sherrie Sport for dysphagia.  She tells me she has pain when she swallows. She states foods are lodging in her mid esophagus. She says water feels like it is lodging. Symptoms for about a month.  She cannot eat steak because she does not have lower dentures. She cannot eat hard meats. She can eat vegetables.  She take Omeprazole 20mg  BID which helps with her acid reflux. Appetite is good. No weight loss. Usually has a BM daily.     Hx of pacemaker, sick sinus syndrome.   Review of Systems Past Medical History:  Diagnosis Date  . Chronic pain   . COPD with emphysema ([place])   . Depression   . GERD (gastroesophageal reflux disease)   . Hypertension   . Mitral and aortic regurgitation    Mild  . Neurodermatitis   . Peptic ulcer   . Sick sinus syndrome ([facility])    s/p PPM ([facility])  . Syncope     Past Surgical History:  Procedure Laterality Date  . APPENDECTOMY    . CARPAL TUNNEL RELEASE    . CHOLECYSTECTOMY    . NISSEN FUNDOPLICATION    . PACEMAKER INSERTION     [employer]  . SHOULDER SURGERY    . SPLENECTOMY      No Known Allergies  Current Outpatient Prescriptions on File Prior to Visit  Medication Sig Dispense Refill  . ALPRAZolam (XANAX) 1 MG tablet Take 1 mg by mouth 3 (three) times daily.      . folic acid (FOLVITE) 1 MG tablet Take 1 mg by mouth daily.  1  . gabapentin (NEURONTIN) 400 MG capsule Take 400 mg by mouth 3 (three) times daily.    Marland Kitchen ipratropium (ATROVENT) 0.02 % nebulizer solution Take 500 mcg by nebulization every 4 (four) hours.    . methotrexate 2.5 MG tablet Take 10 mg by mouth once a week.    . morphine (MSIR) 15 MG tablet Take 15 mg by mouth 3 (three) times daily.      Marland Kitchen omeprazole (PRILOSEC) 20 MG capsule Take 1 capsule by mouth Twice daily.    Marland Kitchen SM VITAMIN D3 1000 units tablet Take 1,000 Units by mouth daily.  0  . traZODone (DESYREL) 50 MG  tablet Take 25 mg by mouth at bedtime as needed for sleep.     No current facility-administered medications on file prior to visit.        Objective:   Physical Exam Blood pressure 108/62, pulse 67, temperature 98.1 F (36.7 C), temperature source Oral, resp. rate 18, height 5\' 2"  (1.575 m), weight 130 lb 8 oz (59.2 kg). Alert and oriented. Skin warm and dry. Oral mucosa is moist.   . Sclera anicteric, conjunctivae is pink. Thyroid not enlarged. No cervical lymphadenopathy. Lungs clear. Heart regular rate and rhythm.  Abdomen is soft. Bowel sounds are positive. No hepatomegaly. No abdominal masses felt. No tenderness.  No edema to lower extremities .        Assessment & Plan:  Dysphagia. Am going to get a DG Esophagram. Further recommendations to follow.  If she needs an EGD/ED. Will be under propofol.

## 2016-12-15 NOTE — Patient Instructions (Signed)
DG esophagram.   

## 2016-12-18 ENCOUNTER — Ambulatory Visit (HOSPITAL_COMMUNITY)
Admission: RE | Admit: 2016-12-18 | Discharge: 2016-12-18 | Disposition: A | Discharge status: Home / Self Care | Payer: PPO | Source: Ambulatory Visit | Attending: Internal Medicine | Admitting: Internal Medicine

## 2016-12-18 DIAGNOSIS — R1310 Dysphagia, unspecified: Secondary | ICD-10-CM | POA: Diagnosis not present

## 2016-12-18 DIAGNOSIS — I700 Atherosclerosis of aorta: Secondary | ICD-10-CM | POA: Insufficient documentation

## 2016-12-18 DIAGNOSIS — K224 Dyskinesia of esophagus: Secondary | ICD-10-CM | POA: Diagnosis not present

## 2016-12-18 DIAGNOSIS — R1319 Other dysphagia: Secondary | ICD-10-CM

## 2016-12-23 ENCOUNTER — Other Ambulatory Visit (INDEPENDENT_AMBULATORY_CARE_PROVIDER_SITE_OTHER): Payer: Self-pay | Admitting: Internal Medicine

## 2016-12-23 ENCOUNTER — Telehealth (INDEPENDENT_AMBULATORY_CARE_PROVIDER_SITE_OTHER): Payer: Self-pay | Admitting: Internal Medicine

## 2016-12-23 DIAGNOSIS — M810 Age-related osteoporosis without current pathological fracture: Secondary | ICD-10-CM | POA: Diagnosis not present

## 2016-12-23 NOTE — Telephone Encounter (Signed)
EGD/ED with propofol

## 2016-12-23 NOTE — Telephone Encounter (Signed)
error 

## 2016-12-24 ENCOUNTER — Encounter (INDEPENDENT_AMBULATORY_CARE_PROVIDER_SITE_OTHER): Payer: Self-pay | Admitting: *Deleted

## 2016-12-24 ENCOUNTER — Other Ambulatory Visit (INDEPENDENT_AMBULATORY_CARE_PROVIDER_SITE_OTHER): Payer: Self-pay | Admitting: Internal Medicine

## 2016-12-24 DIAGNOSIS — L308 Other specified dermatitis: Secondary | ICD-10-CM | POA: Diagnosis not present

## 2016-12-24 DIAGNOSIS — R1310 Dysphagia, unspecified: Secondary | ICD-10-CM

## 2016-12-24 NOTE — Telephone Encounter (Signed)
EGD/ED w propofol sch'd 01/15/17 at 915, preop 6/20 at 9, patient aware, instructions mailed

## 2016-12-29 DIAGNOSIS — I10 Essential (primary) hypertension: Secondary | ICD-10-CM | POA: Diagnosis not present

## 2016-12-29 DIAGNOSIS — J441 Chronic obstructive pulmonary disease with (acute) exacerbation: Secondary | ICD-10-CM | POA: Diagnosis not present

## 2016-12-29 DIAGNOSIS — M545 Low back pain: Secondary | ICD-10-CM | POA: Diagnosis not present

## 2017-01-11 NOTE — Patient Instructions (Signed)
[name]  01/11/2017     @PREFPERIOPPHARMACY @   Your procedure is scheduled on  01/15/2017  Report to [facility] at  745  A.M.  Call this number if you have problems the morning of surgery:  [phone]   Remember:  Do not eat food or drink liquids after midnight.  Take these medicines the morning of surgery with A SIP OF WATER  Xanax, neurontin, morphine, prilosec, zantac, zanaflex.   Do not wear jewelry, make-up or nail polish.  Do not wear lotions, powders, or perfumes, or deoderant.  Do not shave 48 hours prior to surgery.  Men may shave face and neck.  Do not bring valuables to the hospital.  [facility] is not responsible for any belongings or valuables.  Contacts, dentures or bridgework may not be worn into surgery.  Leave your suitcase in the car.  After surgery it may be brought to your room.  For patients admitted to the hospital, discharge time will be determined by your treatment team.  Patients discharged the day of surgery will not be allowed to drive home.   Name and phone number of your driver:   family Special instructions:   Follow the diet instructions given to you by Dr Olevia Perches office.  Please read over the following fact sheets that you were given. Anesthesia Post-op Instructions and Care and Recovery After Surgery       Esophagogastroduodenoscopy Esophagogastroduodenoscopy (EGD) is a procedure to examine the lining of the esophagus, stomach, and first part of the small intestine (duodenum). This procedure is done to check for problems such as inflammation, bleeding, ulcers, or growths. During this procedure, a long, flexible, lighted tube with a camera attached (endoscope) is inserted down the throat. Tell a health care provider about:  Any allergies you have.  All medicines you are taking, including vitamins, herbs, eye drops, creams, and over-the-counter medicines.  Any problems you or family members have had with  anesthetic medicines.  Any blood disorders you have.  Any surgeries you have had.  Any medical conditions you have.  Whether you are pregnant or may be pregnant. What are the risks? Generally, this is a safe procedure. However, problems may occur, including:  Infection.  Bleeding.  A tear (perforation) in the esophagus, stomach, or duodenum.  Trouble breathing.  Excessive sweating.  Spasms of the larynx.  A slowed heartbeat.  Low blood pressure.  What happens before the procedure?  Follow instructions from your health care provider about eating or drinking restrictions.  Ask your health care provider about: ? Changing or stopping your regular medicines. This is especially important if you are taking diabetes medicines or blood thinners. ? Taking medicines such as aspirin and ibuprofen. These medicines can thin your blood. Do not take these medicines before your procedure if your health care provider instructs you not to.  Plan to have someone take you home after the procedure.  If you wear dentures, be ready to remove them before the procedure. What happens during the procedure?  To reduce your risk of infection, your health care team will wash or sanitize their hands.  An IV tube will be put in a vein in your hand or arm. You will get medicines and fluids through this tube.  You will be given one or more of the following: ? A medicine to help you relax (sedative). ? A medicine to numb the area (local anesthetic). This medicine  may be sprayed into your throat. It will make you feel more comfortable and keep you from gagging or coughing during the procedure. ? A medicine for pain.  A mouth guard may be placed in your mouth to protect your teeth and to keep you from biting on the endoscope.  You will be asked to lie on your left side.  The endoscope will be lowered down your throat into your esophagus, stomach, and duodenum.  Air will be put into the endoscope.  This will help your health care provider see better.  The lining of your esophagus, stomach, and duodenum will be examined.  Your health care provider may: ? Take a tissue sample so it can be looked at in a lab (biopsy). ? Remove growths. ? Remove objects (foreign bodies) that are stuck. ? Treat any bleeding with medicines or other devices that stop tissue from bleeding. ? Widen (dilate) or stretch narrowed areas of your esophagus and stomach.  The endoscope will be taken out. The procedure may vary among health care providers and hospitals. What happens after the procedure?  Your blood pressure, heart rate, breathing rate, and blood oxygen level will be monitored often until the medicines you were given have worn off.  Do not eat or drink anything until the numbing medicine has worn off and your gag reflex has returned. This information is not intended to replace advice given to you by your health care provider. Make sure you discuss any questions you have with your health care provider. Document Released: 11/13/2004 Document Revised: 12/19/2015 Document Reviewed: 06/06/2015 Elsevier Interactive Patient Education  2018 [employer]. Esophagogastroduodenoscopy, Care After Refer to this sheet in the next few weeks. These instructions provide you with information about caring for yourself after your procedure. Your health care provider may also give you more specific instructions. Your treatment has been planned according to current medical practices, but problems sometimes occur. Call your health care provider if you have any problems or questions after your procedure. What can I expect after the procedure? After the procedure, it is common to have:  A sore throat.  Nausea.  Bloating.  Dizziness.  Fatigue.  Follow these instructions at home:  Do not eat or drink anything until the numbing medicine (local anesthetic) has worn off and your gag reflex has returned. You will know  that the local anesthetic has worn off when you can swallow comfortably.  Do not drive for 24 hours if you received a medicine to help you relax (sedative).  If your health care provider took a tissue sample for testing during the procedure, make sure to get your test results. This is your responsibility. Ask your health care provider or the department performing the test when your results will be ready.  Keep all follow-up visits as told by your health care provider. This is important. Contact a health care provider if:  You cannot stop coughing.  You are not urinating.  You are urinating less than usual. Get help right away if:  You have trouble swallowing.  You cannot eat or drink.  You have throat or chest pain that gets worse.  You are dizzy or light-headed.  You faint.  You have nausea or vomiting.  You have chills.  You have a fever.  You have severe abdominal pain.  You have black, tarry, or bloody stools. This information is not intended to replace advice given to you by your health care provider. Make sure you discuss any questions you have  with your health care provider. Document Released: 06/29/2012 Document Revised: 12/19/2015 Document Reviewed: 06/06/2015 Elsevier Interactive Patient Education  2018 [employer].  Esophageal Dilatation Esophageal dilatation is a procedure to open a blocked or narrowed part of the esophagus. The esophagus is the long tube in your throat that carries food and liquid from your mouth to your stomach. The procedure is also called esophageal dilation. You may need this procedure if you have a buildup of scar tissue in your esophagus that makes it difficult, painful, or even impossible to swallow. This can be caused by gastroesophageal reflux disease (GERD). In rare cases, people need this procedure because they have cancer of the esophagus or a problem with the way food moves through the esophagus. Sometimes you may need to have  another dilatation to enlarge the opening of the esophagus gradually. Tell a health care provider about:  Any allergies you have.  All medicines you are taking, including vitamins, herbs, eye drops, creams, and over-the-counter medicines.  Any problems you or family members have had with anesthetic medicines.  Any blood disorders you have.  Any surgeries you have had.  Any medical conditions you have.  Any antibiotic medicines you are required to take before dental procedures. What are the risks? Generally, this is a safe procedure. However, problems can occur and include:  Bleeding from a tear in the lining of the esophagus.  A hole (perforation) in the esophagus.  What happens before the procedure?  Do not eat or drink anything after midnight on the night before the procedure or as directed by your health care provider.  Ask your health care provider about changing or stopping your regular medicines. This is especially important if you are taking diabetes medicines or blood thinners.  Plan to have someone take you home after the procedure. What happens during the procedure?  You will be given a medicine that makes you relaxed and sleepy (sedative).  A medicine may be sprayed or gargled to numb the back of the throat.  Your health care provider can use various instruments to do an esophageal dilatation. During the procedure, the instrument used will be placed in your mouth and passed down into your esophagus. Options include: ? Simple dilators. This instrument is carefully placed in the esophagus to stretch it. ? Guided wire bougies. In this method, a flexible tube (endoscope) is used to insert a wire into the esophagus. The dilator is passed over this wire to enlarge the esophagus. Then the wire is removed. ? Balloon dilators. An endoscope with a small balloon at the end is passed down into the esophagus. Inflating the balloon gently stretches the esophagus and opens it  up. What happens after the procedure?  Your blood pressure, heart rate, breathing rate, and blood oxygen level will be monitored often until the medicines you were given have worn off.  Your throat may feel slightly sore and will probably still feel numb. This will improve slowly over time.  You will not be allowed to eat or drink until the throat numbness has resolved.  If this is a same-day procedure, you may be allowed to go home once you have been able to drink, urinate, and sit on the edge of the bed without nausea or dizziness.  If this is a same-day procedure, you should have a friend or family member with you for the next 24 hours after the procedure. This information is not intended to replace advice given to you by your health care provider.  Make sure you discuss any questions you have with your health care provider. Document Released: 09/03/2005 Document Revised: 12/19/2015 Document Reviewed: 11/22/2013 Elsevier Interactive Patient Education  2017 [place] Anesthesia is a term that refers to techniques, procedures, and medicines that help a person stay safe and comfortable during a medical procedure. Monitored anesthesia care, or sedation, is one type of anesthesia. Your anesthesia specialist may recommend sedation if you will be having a procedure that does not require you to be unconscious, such as:  Cataract surgery.  A dental procedure.  A biopsy.  A colonoscopy.  During the procedure, you may receive a medicine to help you relax (sedative). There are three levels of sedation:  Mild sedation. At this level, you may feel awake and relaxed. You will be able to follow directions.  Moderate sedation. At this level, you will be sleepy. You may not remember the procedure.  Deep sedation. At this level, you will be asleep. You will not remember the procedure.  The more medicine you are given, the deeper your level of sedation will be.  Depending on how you respond to the procedure, the anesthesia specialist may change your level of sedation or the type of anesthesia to fit your needs. An anesthesia specialist will monitor you closely during the procedure. Let your health care provider know about:  Any allergies you have.  All medicines you are taking, including vitamins, herbs, eye drops, creams, and over-the-counter medicines.  Any use of steroids (by mouth or as a cream).  Any problems you or family members have had with sedatives and anesthetic medicines.  Any blood disorders you have.  Any surgeries you have had.  Any medical conditions you have, such as sleep apnea.  Whether you are pregnant or may be pregnant.  Any use of cigarettes, alcohol, or street drugs. What are the risks? Generally, this is a safe procedure. However, problems may occur, including:  Getting too much medicine (oversedation).  Nausea.  Allergic reaction to medicines.  Trouble breathing. If this happens, a breathing tube may be used to help with breathing. It will be removed when you are awake and breathing on your own.  Heart trouble.  Lung trouble.  Before the procedure Staying hydrated Follow instructions from your health care provider about hydration, which may include:  Up to 2 hours before the procedure - you may continue to drink clear liquids, such as water, clear fruit juice, black coffee, and plain tea.  Eating and drinking restrictions Follow instructions from your health care provider about eating and drinking, which may include:  8 hours before the procedure - stop eating heavy meals or foods such as meat, fried foods, or fatty foods.  6 hours before the procedure - stop eating light meals or foods, such as toast or cereal.  6 hours before the procedure - stop drinking milk or drinks that contain milk.  2 hours before the procedure - stop drinking clear liquids.  Medicines Ask your health care provider  about:  Changing or stopping your regular medicines. This is especially important if you are taking diabetes medicines or blood thinners.  Taking medicines such as aspirin and ibuprofen. These medicines can thin your blood. Do not take these medicines before your procedure if your health care provider instructs you not to.  Tests and exams  You will have a physical exam.  You may have blood tests done to show: ? How well your kidneys and liver are working. ?  How well your blood can clot.  General instructions  Plan to have someone take you home from the hospital or clinic.  If you will be going home right after the procedure, plan to have someone with you for 24 hours.  What happens during the procedure?  Your blood pressure, heart rate, breathing, level of pain and overall condition will be monitored.  An IV tube will be inserted into one of your veins.  Your anesthesia specialist will give you medicines as needed to keep you comfortable during the procedure. This may mean changing the level of sedation.  The procedure will be performed. After the procedure  Your blood pressure, heart rate, breathing rate, and blood oxygen level will be monitored until the medicines you were given have worn off.  Do not drive for 24 hours if you received a sedative.  You may: ? Feel sleepy, clumsy, or nauseous. ? Feel forgetful about what happened after the procedure. ? Have a sore throat if you had a breathing tube during the procedure. ? Vomit. This information is not intended to replace advice given to you by your health care provider. Make sure you discuss any questions you have with your health care provider. Document Released: 04/08/2005 Document Revised: 12/20/2015 Document Reviewed: 11/03/2015 Elsevier Interactive Patient Education  2018 [place], Care After These instructions provide you with information about caring for yourself after your  procedure. Your health care provider may also give you more specific instructions. Your treatment has been planned according to current medical practices, but problems sometimes occur. Call your health care provider if you have any problems or questions after your procedure. What can I expect after the procedure? After your procedure, it is common to:  Feel sleepy for several hours.  Feel clumsy and have poor balance for several hours.  Feel forgetful about what happened after the procedure.  Have poor judgment for several hours.  Feel nauseous or vomit.  Have a sore throat if you had a breathing tube during the procedure.  Follow these instructions at home: For at least 24 hours after the procedure:   Do not: ? Participate in activities in which you could fall or become injured. ? Drive. ? Use heavy machinery. ? Drink alcohol. ? Take sleeping pills or medicines that cause drowsiness. ? Make important decisions or sign legal documents. ? Take care of children on your own.  Rest. Eating and drinking  Follow the diet that is recommended by your health care provider.  If you vomit, drink water, juice, or soup when you can drink without vomiting.  Make sure you have little or no nausea before eating solid foods. General instructions  Have a responsible adult stay with you until you are awake and alert.  Take over-the-counter and prescription medicines only as told by your health care provider.  If you smoke, do not smoke without supervision.  Keep all follow-up visits as told by your health care provider. This is important. Contact a health care provider if:  You keep feeling nauseous or you keep vomiting.  You feel light-headed.  You develop a rash.  You have a fever. Get help right away if:  You have trouble breathing. This information is not intended to replace advice given to you by your health care provider. Make sure you discuss any questions you have with  your health care provider. Document Released: 11/03/2015 Document Revised: 03/04/2016 Document Reviewed: 11/03/2015 Elsevier Interactive Patient Education  [employer].

## 2017-01-13 ENCOUNTER — Encounter (HOSPITAL_COMMUNITY): Payer: Self-pay

## 2017-01-13 ENCOUNTER — Encounter (HOSPITAL_COMMUNITY)
Admission: RE | Admit: 2017-01-13 | Discharge: 2017-01-13 | Disposition: A | Discharge status: Home / Self Care | Payer: PPO | Source: Ambulatory Visit | Attending: Internal Medicine | Admitting: Internal Medicine

## 2017-01-13 DIAGNOSIS — J439 Emphysema, unspecified: Secondary | ICD-10-CM | POA: Diagnosis not present

## 2017-01-13 DIAGNOSIS — K257 Chronic gastric ulcer without hemorrhage or perforation: Secondary | ICD-10-CM | POA: Diagnosis not present

## 2017-01-13 DIAGNOSIS — R1314 Dysphagia, pharyngoesophageal phase: Secondary | ICD-10-CM | POA: Diagnosis not present

## 2017-01-13 DIAGNOSIS — F329 Major depressive disorder, single episode, unspecified: Secondary | ICD-10-CM | POA: Diagnosis not present

## 2017-01-13 DIAGNOSIS — Z9081 Acquired absence of spleen: Secondary | ICD-10-CM | POA: Diagnosis not present

## 2017-01-13 DIAGNOSIS — Z79899 Other long term (current) drug therapy: Secondary | ICD-10-CM | POA: Diagnosis not present

## 2017-01-13 DIAGNOSIS — Z79891 Long term (current) use of opiate analgesic: Secondary | ICD-10-CM | POA: Diagnosis not present

## 2017-01-13 DIAGNOSIS — Z950 Presence of cardiac pacemaker: Secondary | ICD-10-CM | POA: Diagnosis not present

## 2017-01-13 DIAGNOSIS — Z87891 Personal history of nicotine dependence: Secondary | ICD-10-CM | POA: Diagnosis not present

## 2017-01-13 DIAGNOSIS — K2210 Ulcer of esophagus without bleeding: Secondary | ICD-10-CM | POA: Diagnosis not present

## 2017-01-13 DIAGNOSIS — K3189 Other diseases of stomach and duodenum: Secondary | ICD-10-CM | POA: Diagnosis not present

## 2017-01-13 DIAGNOSIS — I10 Essential (primary) hypertension: Secondary | ICD-10-CM | POA: Diagnosis not present

## 2017-01-13 DIAGNOSIS — Q439 Congenital malformation of intestine, unspecified: Secondary | ICD-10-CM | POA: Diagnosis not present

## 2017-01-13 DIAGNOSIS — K2950 Unspecified chronic gastritis without bleeding: Secondary | ICD-10-CM | POA: Diagnosis not present

## 2017-01-13 DIAGNOSIS — Z9889 Other specified postprocedural states: Secondary | ICD-10-CM | POA: Diagnosis not present

## 2017-01-13 DIAGNOSIS — Z8711 Personal history of peptic ulcer disease: Secondary | ICD-10-CM | POA: Diagnosis not present

## 2017-01-13 DIAGNOSIS — Z9049 Acquired absence of other specified parts of digestive tract: Secondary | ICD-10-CM | POA: Diagnosis not present

## 2017-01-13 DIAGNOSIS — K219 Gastro-esophageal reflux disease without esophagitis: Secondary | ICD-10-CM | POA: Diagnosis not present

## 2017-01-13 LAB — CBC WITH DIFFERENTIAL/PLATELET
BASOS PCT: 2 %
Basophils Absolute: 0.1 10*3/uL (ref 0.0–0.1)
EOS ABS: 0.3 10*3/uL (ref 0.0–0.7)
Eosinophils Relative: 4 %
HEMATOCRIT: 29.6 % — AB (ref 36.0–46.0)
Hemoglobin: 9.7 g/dL — ABNORMAL LOW (ref 12.0–15.0)
LYMPHS ABS: 2.3 10*3/uL (ref 0.7–4.0)
Lymphocytes Relative: 36 %
MCH: 35.7 pg — AB (ref 26.0–34.0)
MCHC: 32.8 g/dL (ref 30.0–36.0)
MCV: 108.8 fL — ABNORMAL HIGH (ref 78.0–100.0)
MONO ABS: 0.9 10*3/uL (ref 0.1–1.0)
MONOS PCT: 13 %
NEUTROS ABS: 2.9 10*3/uL (ref 1.7–7.7)
Neutrophils Relative %: 45 %
Platelets: 312 10*3/uL (ref 150–400)
RBC: 2.72 MIL/uL — ABNORMAL LOW (ref 3.87–5.11)
RDW: 14.4 % (ref 11.5–15.5)
WBC: 6.4 10*3/uL (ref 4.0–10.5)

## 2017-01-13 LAB — BASIC METABOLIC PANEL
Anion gap: 6 (ref 5–15)
BUN: 14 mg/dL (ref 6–20)
CALCIUM: 7.8 mg/dL — AB (ref 8.9–10.3)
CO2: 26 mmol/L (ref 22–32)
CREATININE: 1.03 mg/dL — AB (ref 0.44–1.00)
Chloride: 106 mmol/L (ref 101–111)
GFR calc non Af Amer: 53 mL/min — ABNORMAL LOW (ref >60)
Glucose, Bld: 113 mg/dL — ABNORMAL HIGH (ref 65–99)
Potassium: 4.3 mmol/L (ref 3.5–5.1)
Sodium: 138 mmol/L (ref 135–145)

## 2017-01-15 ENCOUNTER — Encounter (HOSPITAL_COMMUNITY)
Admission: RE | Disposition: A | Discharge status: Home / Self Care | Payer: Self-pay | Source: Ambulatory Visit | Attending: Internal Medicine

## 2017-01-15 ENCOUNTER — Encounter (HOSPITAL_COMMUNITY): Payer: Self-pay | Admitting: *Deleted

## 2017-01-15 ENCOUNTER — Ambulatory Visit (HOSPITAL_COMMUNITY): Payer: PPO | Admitting: Anesthesiology

## 2017-01-15 ENCOUNTER — Ambulatory Visit (HOSPITAL_COMMUNITY)
Admission: RE | Admit: 2017-01-15 | Discharge: 2017-01-15 | Disposition: A | Discharge status: Home / Self Care | Payer: PPO | Source: Ambulatory Visit | Attending: Internal Medicine | Admitting: Internal Medicine

## 2017-01-15 DIAGNOSIS — Z87891 Personal history of nicotine dependence: Secondary | ICD-10-CM | POA: Diagnosis not present

## 2017-01-15 DIAGNOSIS — K2210 Ulcer of esophagus without bleeding: Secondary | ICD-10-CM | POA: Insufficient documentation

## 2017-01-15 DIAGNOSIS — K228 Other specified diseases of esophagus: Secondary | ICD-10-CM | POA: Diagnosis not present

## 2017-01-15 DIAGNOSIS — Z9049 Acquired absence of other specified parts of digestive tract: Secondary | ICD-10-CM | POA: Insufficient documentation

## 2017-01-15 DIAGNOSIS — Q439 Congenital malformation of intestine, unspecified: Secondary | ICD-10-CM | POA: Diagnosis not present

## 2017-01-15 DIAGNOSIS — Z9081 Acquired absence of spleen: Secondary | ICD-10-CM | POA: Insufficient documentation

## 2017-01-15 DIAGNOSIS — J439 Emphysema, unspecified: Secondary | ICD-10-CM | POA: Insufficient documentation

## 2017-01-15 DIAGNOSIS — R8589 Other abnormal findings in specimens from digestive organs and abdominal cavity: Secondary | ICD-10-CM | POA: Diagnosis not present

## 2017-01-15 DIAGNOSIS — K219 Gastro-esophageal reflux disease without esophagitis: Secondary | ICD-10-CM | POA: Diagnosis not present

## 2017-01-15 DIAGNOSIS — K3189 Other diseases of stomach and duodenum: Secondary | ICD-10-CM | POA: Diagnosis not present

## 2017-01-15 DIAGNOSIS — Z79891 Long term (current) use of opiate analgesic: Secondary | ICD-10-CM | POA: Insufficient documentation

## 2017-01-15 DIAGNOSIS — Z8711 Personal history of peptic ulcer disease: Secondary | ICD-10-CM | POA: Diagnosis not present

## 2017-01-15 DIAGNOSIS — Z9889 Other specified postprocedural states: Secondary | ICD-10-CM | POA: Diagnosis not present

## 2017-01-15 DIAGNOSIS — R1310 Dysphagia, unspecified: Secondary | ICD-10-CM | POA: Diagnosis not present

## 2017-01-15 DIAGNOSIS — K2950 Unspecified chronic gastritis without bleeding: Secondary | ICD-10-CM | POA: Insufficient documentation

## 2017-01-15 DIAGNOSIS — F329 Major depressive disorder, single episode, unspecified: Secondary | ICD-10-CM | POA: Diagnosis not present

## 2017-01-15 DIAGNOSIS — K257 Chronic gastric ulcer without hemorrhage or perforation: Secondary | ICD-10-CM | POA: Insufficient documentation

## 2017-01-15 DIAGNOSIS — I10 Essential (primary) hypertension: Secondary | ICD-10-CM | POA: Insufficient documentation

## 2017-01-15 DIAGNOSIS — R1314 Dysphagia, pharyngoesophageal phase: Secondary | ICD-10-CM | POA: Insufficient documentation

## 2017-01-15 DIAGNOSIS — Z950 Presence of cardiac pacemaker: Secondary | ICD-10-CM | POA: Insufficient documentation

## 2017-01-15 DIAGNOSIS — K259 Gastric ulcer, unspecified as acute or chronic, without hemorrhage or perforation: Secondary | ICD-10-CM | POA: Diagnosis not present

## 2017-01-15 DIAGNOSIS — Z79899 Other long term (current) drug therapy: Secondary | ICD-10-CM | POA: Insufficient documentation

## 2017-01-15 HISTORY — PX: ESOPHAGOGASTRODUODENOSCOPY (EGD) WITH PROPOFOL: SHX5813

## 2017-01-15 HISTORY — PX: ESOPHAGEAL DILATION: SHX303

## 2017-01-15 SURGERY — ESOPHAGOGASTRODUODENOSCOPY (EGD) WITH PROPOFOL
Anesthesia: Monitor Anesthesia Care

## 2017-01-15 MED ORDER — GLYCOPYRROLATE 0.2 MG/ML IJ SOLN
INTRAMUSCULAR | Status: AC
  Filled 2017-01-15: qty 1

## 2017-01-15 MED ORDER — MIDAZOLAM HCL 2 MG/2ML IJ SOLN
INTRAMUSCULAR | Status: AC
  Filled 2017-01-15: qty 2

## 2017-01-15 MED ORDER — FENTANYL CITRATE (PF) 100 MCG/2ML IJ SOLN
25.0000 ug | Freq: Once | INTRAMUSCULAR | Status: AC
  Administered 2017-01-15: 25 ug via INTRAVENOUS

## 2017-01-15 MED ORDER — LIDOCAINE VISCOUS 2 % MT SOLN
15.0000 mL | Freq: Once | OROMUCOSAL | Status: AC
  Administered 2017-01-15: 3 mL via OROMUCOSAL

## 2017-01-15 MED ORDER — CHLORHEXIDINE GLUCONATE CLOTH 2 % EX PADS: 6.0000 | MEDICATED_PAD | Freq: Once | CUTANEOUS | Status: DC

## 2017-01-15 MED ORDER — FENTANYL CITRATE (PF) 100 MCG/2ML IJ SOLN
INTRAMUSCULAR | Status: AC
  Filled 2017-01-15: qty 2

## 2017-01-15 MED ORDER — PROPOFOL 10 MG/ML IV BOLUS
INTRAVENOUS | Status: AC
  Filled 2017-01-15: qty 20

## 2017-01-15 MED ORDER — SUCRALFATE 1 GM/10ML PO SUSP: 1.0000 g | Freq: Four times a day (QID) | ORAL | 1 refills | Status: DC

## 2017-01-15 MED ORDER — MIDAZOLAM HCL 2 MG/2ML IJ SOLN
1.0000 mg | INTRAMUSCULAR | Status: AC
  Administered 2017-01-15: 2 mg via INTRAVENOUS

## 2017-01-15 MED ORDER — MIDAZOLAM HCL 5 MG/5ML IJ SOLN
INTRAMUSCULAR | Status: DC | PRN
Start: 2017-01-15 — End: 2017-01-15
  Administered 2017-01-15: 2 mg via INTRAVENOUS

## 2017-01-15 MED ORDER — LACTATED RINGERS IV SOLN
INTRAVENOUS | Status: DC
  Administered 2017-01-15: 09:00:00 via INTRAVENOUS

## 2017-01-15 MED ORDER — LIDOCAINE HCL (PF) 1 % IJ SOLN
INTRAMUSCULAR | Status: AC
  Filled 2017-01-15: qty 5

## 2017-01-15 MED ORDER — PROPOFOL 500 MG/50ML IV EMUL
INTRAVENOUS | Status: DC | PRN
  Administered 2017-01-15: 75 ug/kg/min via INTRAVENOUS

## 2017-01-15 MED ORDER — LIDOCAINE VISCOUS 2 % MT SOLN
OROMUCOSAL | Status: AC
Start: 2017-01-15 — End: 2017-01-15
  Filled 2017-01-15: qty 15

## 2017-01-15 NOTE — H&P (Addendum)
[name] is an 71 y.o. female.   Chief Complaint: Patient is here for EGD and ED. HPI: A shunt is 71 year old Caucasian female with multiple medical problems who presents with 2 months history of dysphagia to solids and liquids. He points or sternal areas of bolus obstruction. She has history of GERD. She has undergone anti-reflex surgery twice. She is back on PPI and states it controls her heartburn quite well. She says she has lost 7-8 pounds in the last few months. She's not of it is due to dysphagia or other reasons. She denies nausea vomiting hematemesis abdominal pain or melena. Patient states she uses dentures and eats very slowly. Patient had barium study suggesting esophageal dysmotility. Barium pill passed through the esophagus without any delay.  Past Medical History:  Diagnosis Date  . Chronic pain   . COPD with emphysema ([place])   . Depression   . GERD (gastroesophageal reflux disease)   . Hypertension   . Mitral and aortic regurgitation    Mild  . Neurodermatitis   . Peptic ulcer   . Sick sinus syndrome ([facility])    s/p PPM ([facility])  . Syncope     Past Surgical History:  Procedure Laterality Date  . APPENDECTOMY    . CARPAL TUNNEL RELEASE    . CHOLECYSTECTOMY    . NISSEN FUNDOPLICATION    . PACEMAKER INSERTION     [facility]  . SHOULDER SURGERY    . SPLENECTOMY      History reviewed. No pertinent family history. Social History:  reports that she quit smoking about 4 years ago. Her smoking use included Cigarettes. She started smoking about 53 years ago. She has a 80.00 pack-year smoking history. She has never used smokeless tobacco. She reports that she does not drink alcohol or use drugs.  Allergies: No Known Allergies  Medications Prior to Admission  Medication Sig Dispense Refill  . ALPRAZolam (XANAX) 1 MG tablet Take 1 mg by mouth 3 (three) times daily.      . cholecalciferol (VITAMIN D) 1000 units tablet Take 1,000 Units by mouth daily.  0  . folic acid (FOLVITE) 1  MG tablet Take 1 mg by mouth daily.  1  . gabapentin (NEURONTIN) 400 MG capsule Take 400 mg by mouth 3 (three) times daily.    Marland Kitchen ipratropium (ATROVENT) 0.02 % nebulizer solution Take 0.25 mg by nebulization every 4 (four) hours as needed (for wheezing/shortness of breath).     . methotrexate 2.5 MG tablet Take 10 mg by mouth every Friday.     . morphine (MSIR) 15 MG tablet Take 15 mg by mouth 3 (three) times daily.      . Omega-3 Fatty Acids (FISH OIL) 1000 MG CAPS Take 1,000 mg by mouth 3 (three) times daily.    Marland Kitchen omeprazole (PRILOSEC) 40 MG capsule Take 40 mg by mouth daily.    . Potassium 99 MG TABS Take 99 mg by mouth 2 (two) times daily as needed (for cramps).    . ranitidine (ZANTAC) 300 MG tablet Take 300 mg by mouth at bedtime.    Marland Kitchen tetrahydrozoline-zinc (VISINE-AC) 0.05-0.25 % ophthalmic solution Place 1-2 drops into both eyes 3 (three) times daily as needed (for runny eyes.).    Marland Kitchen tiZANidine (ZANAFLEX) 2 MG tablet Take 2 mg by mouth 2 (two) times daily.   0  . traZODone (DESYREL) 50 MG tablet Take 25 mg by mouth at bedtime.     . triamcinolone cream (KENALOG) 0.1 % Apply  1 application topically 2 (two) times daily as needed (for skin irritation.).      No results found for this or any previous visit (from the past 48 hour(s)). No results found.  ROS  Blood pressure (!) 135/51, pulse 65, temperature 98 F (36.7 C), temperature source Oral, resp. rate (!) 55, height 5\' 2"  (1.575 m), weight 133 lb (60.3 kg), SpO2 98 %. Physical Exam  Constitutional:  Well-developed thin Caucasian female in NAD.  HENT:  Mouth/Throat: Oropharynx is clear and moist.  She is edentulous. Dentures have been removed for the procedure.  Eyes: Conjunctivae are normal. No scleral icterus.  Neck: No thyromegaly present.  Cardiovascular: Normal rate and regular rhythm.   Murmur (faint systolic murmur noted at left sternal border.) heard. Respiratory: Effort normal and breath sounds normal.  GI:  Abdomen  is symmetrical with a bur midline scar. Abdomen is soft and nontender without organomegaly or masses.  Musculoskeletal: She exhibits no edema.  Neurological: She is alert.  Skin: Skin is warm.  She has multiple superficial ulcers involving both forearms and right leg. She has seen dermatologists and has been told she has very sensitive skin. She has been told not to scratch her skin.     Assessment/Plan Dysphagia to solids and liquids. History of GERD with remote anti-reflux surgery. EGD with ED under monitored anesthesia care.  Hildred Laser, MD 01/15/2017, 9:32 AM

## 2017-01-15 NOTE — Op Note (Signed)
[facility] Patient Name: [name] Procedure Date: 01/15/2017 9:26 AM MRN: [id number] Date of Birth: [birth year] Attending MD: Hildred Laser , MD CSN: [id number] Age: 71 Admit Type: Outpatient Procedure:                Upper GI endoscopy Indications:              Esophageal dysphagia Providers:                Hildred Laser, MD, Hinton Rao, RN, Randa Spike, Technician Referring MD:             Stoney Bang, MD Medicines:                Lidocaine spray, Propofol per Anesthesia Complications:            No immediate complications. Estimated Blood Loss:     Estimated blood loss was minimal. Procedure:                Pre-Anesthesia Assessment:                           - Prior to the procedure, a History and Physical                            was performed, and patient medications and                            allergies were reviewed. The patient's tolerance of                            previous anesthesia was also reviewed. The risks                            and benefits of the procedure and the sedation                            options and risks were discussed with the patient.                            All questions were answered, and informed consent                            was obtained. Prior Anticoagulants: The patient has                            taken no previous anticoagulant or antiplatelet                            agents. ASA Grade Assessment: III - A patient with                            severe systemic disease. After reviewing the risks  and benefits, the patient was deemed in                            satisfactory condition to undergo the procedure.                           After obtaining informed consent, the endoscope was                            passed under direct vision. Throughout the                            procedure, the patient's blood pressure, pulse, and          oxygen saturations were monitored continuously. The                            EG-[id number] ([id number]) scope was introduced through the                            and advanced to the second part of duodenum. The                            upper GI endoscopy was accomplished without                            difficulty. The patient tolerated the procedure                            well. Scope In: 9:47:35 AM Scope Out: 10:07:55 AM Total Procedure Duration: 0 hours 20 minutes 20 seconds  Findings:      Six superficial esophageal ulcers with no bleeding and no stigmata of       recent bleeding were found 17 to 24 cm from the incisors. The largest       lesion was 15 mm in largest dimension. Biopsies were taken with a cold       forceps for histology. Cells for cytology were obtained by brushing.      The mid esophagus and distal esophagus were normal.      The Z-line was irregular and was found 36 cm from the incisors. A TTS       dilator was passed through the scope. Dilation with a 15-16.5-18 mm       balloon dilator was performed to 15 mm, 16.5 mm and 18 mm. The dilation       site was examined and showed moderate improvement in luminal narrowing       and no bleeding, mucosal tear or perforation.      Evidence of a 829 degree fundoplication was found in the cardia. The       wrap appeared tight. This was traversed.      Two non-bleeding superficial gastric ulcers with no stigmata of bleeding       were found at the pylorus. Biopsies were taken with a cold forceps for       histology.      The duodenal bulb was normal.      A mild post-ulcer deformity was found in the first  portion of the       duodenum and in the second portion of the duodenum.      The second portion of the duodenum was normal. Impression:               - Non-bleeding esophageal ulcers involving proximal                            third. Biopsied. Cells for cytology obtained.                           - Normal mid  esophagus and distal esophagus.                           - Z-line irregular, 36 cm from the incisors. Tight                            LES. Dilated with balloon dilator to 18 mm                           - A 742 degree fundoplication was found. The wrap                            appears tight.                           - Non-bleeding gastric ulcers with no stigmata of                            bleeding. Biopsied.                           - Normal duodenal bulb.                           - Duodenal deformity.                           - Normal second portion of the duodenum. Moderate Sedation:      Per Anesthesia Care Recommendation:           - Patient has a contact number available for                            emergencies. The signs and symptoms of potential                            delayed complications were discussed with the                            patient. Return to normal activities tomorrow.                            Written discharge instructions were provided to the                            patient.                           -  Full liquid diet today.                           - Resume previous diet for 1 day.                           - Continue present medications.                           - No aspirin, ibuprofen, naproxen, or other                            non-steroidal anti-inflammatory drugs for 3 days.                           - Sucralfate 1 g by mouth before meals and daily at                            bed.                           - Await pathology results.                           Comment. While results of biopsy and brushing are                            pending. I wonder if esophageal ulcers are                            secondary to methotrexate. Procedure Code(s):        --- Professional ---                           [phone], Esophagogastroduodenoscopy, flexible,                            transoral; with transendoscopic balloon dilation of                             esophagus (less than 30 mm diameter)                           43239, Esophagogastroduodenoscopy, flexible,                            transoral; with biopsy, single or multiple Diagnosis Code(s):        --- Professional ---                           K22.10, Ulcer of esophagus without bleeding                           K22.8, Other specified diseases of esophagus                           Z98.890, Other  specified postprocedural states                           K25.9, Gastric ulcer, unspecified as acute or                            chronic, without hemorrhage or perforation                           K31.89, Other diseases of stomach and duodenum                           R13.14, Dysphagia, pharyngoesophageal phase CPT copyright 2016 American Medical Association. All rights reserved. The codes documented in this report are preliminary and upon coder review may  be revised to meet current compliance requirements. Hildred Laser, MD Hildred Laser, MD 01/15/2017 10:23:57 AM This report has been signed electronically. Number of Addenda: 0

## 2017-01-15 NOTE — Anesthesia Preprocedure Evaluation (Signed)
Anesthesia Evaluation  Patient identified by MRN, date of birth, ID band Patient awake    Reviewed: Allergy & Precautions, NPO status , Patient's Chart, lab work & pertinent test results  Airway Mallampati: III  TM Distance: >3 FB     Dental  (+) Edentulous Upper, Edentulous Lower   Pulmonary shortness of breath and with exertion, COPD (emphysema), former smoker,    breath sounds clear to auscultation       Cardiovascular hypertension, Pt. on medications + DOE  + pacemaker + Valvular Problems/Murmurs MR and AI  Rhythm:Regular Rate:Normal  Hx syncope    Neuro/Psych PSYCHIATRIC DISORDERS Depression    GI/Hepatic Neg liver ROS, PUD, GERD  Controlled and Medicated,  Endo/Other  negative endocrine ROS  Renal/GU negative Renal ROS     Musculoskeletal   Abdominal   Peds  Hematology negative hematology ROS (+)   Anesthesia Other Findings   Reproductive/Obstetrics                             Anesthesia Physical Anesthesia Plan  ASA: III  Anesthesia Plan: MAC   Post-op Pain Management:    Induction: Intravenous  PONV Risk Score and Plan:   Airway Management Planned: Simple Face Mask  Additional Equipment:   Intra-op Plan:   Post-operative Plan:   Informed Consent: I have reviewed the patients History and Physical, chart, labs and discussed the procedure including the risks, benefits and alternatives for the proposed anesthesia with the patient or authorized representative who has indicated his/her understanding and acceptance.     Plan Discussed with:   Anesthesia Plan Comments:         Anesthesia Quick Evaluation

## 2017-01-15 NOTE — Anesthesia Postprocedure Evaluation (Signed)
Anesthesia Post Note  Patient: Safari Cinque  Procedure(s) Performed: Procedure(s) (LRB): ESOPHAGOGASTRODUODENOSCOPY (EGD) WITH PROPOFOL (N/A) ESOPHAGEAL DILATION (N/A)  Patient location during evaluation: PACU Anesthesia Type: MAC Level of consciousness: patient cooperative Pain management: pain level controlled Vital Signs Assessment: post-procedure vital signs reviewed and stable Respiratory status: spontaneous breathing, nonlabored ventilation and respiratory function stable Cardiovascular status: blood pressure returned to baseline Postop Assessment: no signs of nausea or vomiting Anesthetic complications: no     Last Vitals:  Vitals:   01/15/17 0935 01/15/17 1015  BP:  (!) 109/55  Pulse:  66  Resp: (!) 35 (P) 10  Temp:  (P) 36.7 C    Last Pain:  Vitals:   01/15/17 0819  TempSrc: Oral                 Sinclair Alligood J

## 2017-01-15 NOTE — Discharge Instructions (Signed)
No aspirin or NSAIDs for 3 days. Resume usual medications. Sucralfate 1 g by mouth before meals and daily at bedtime. Full liquids today and usual diet starting tomorrow morning. No driving for 24 hours. Physician will call with biopsy results.       Esophagogastrectomy, Care After Refer to this sheet in the next few weeks. These instructions provide you with information about caring for yourself after your procedure. Your health care provider may also give you more specific instructions. Your treatment has been planned according to current medical practices, but problems sometimes occur. Call your health care provider if you have any problems or questions after your procedure. What can I expect after the procedure? After the procedure, it is common to have:  Pain or soreness.  Difficulty swallowing.  Looser and more frequent bowel movements.  Heartburn.  Follow these instructions at home: Incision care  Follow instructions from your health care provider about how to take care of your incision. Make sure you: ? Wash your hands with soap and water before you change your bandage (dressing). If soap and water are not available, use hand sanitizer. ? Change your dressing as told by your health care provider. ? Leave stitches (sutures), skin glue, or adhesive strips in place. These skin closures may need to stay in place for 2 weeks or longer. If adhesive strip edges start to loosen and curl up, you may trim the loose edges. Do not remove adhesive strips completely unless your health care provider tells you to do that.  Check your incisions every day for signs of infection. Check for: ? More redness, swelling, or pain. ? More fluid or blood. ? Warmth. ? Pus or a bad smell. Activity  Do not have sex until your health care provider approves.  Do not lift anything that is heavier than 10 lb (4.5 kg).  Rest as told by your health care provider. Ask your health care provider when  you can resume your usual activities, including work.  Do not drive for 24 hours if you received a sedative.  Do not drive or operate heavy machinery while taking prescription pain medicine.  Do breathing exercises as told by your health care provider. General instructions  Follow your health care providers instructions on caring for your feeding tube, if you have one. A home health nurse may come to your home to help you. A feeding tube may stay in place for several weeks. It can be removed when you are able to eat.  Follow instructions from your health care provider about eating or drinking restrictions.  Take over-the-counter and prescription medicines only as told by your health care provider.  Wear compression stockings as told by your health care provider. These help to prevent blood clots and reduce swelling in your legs.  Do not take baths, swim, or use a hot tub until your health care provider approves. Ask your health care provider if you can take showers. You may only be allowed to take sponge baths.  Do not use tobacco products, including cigarettes, chewing tobacco, or e-cigarettes. If you need help quitting, ask your health care provider.  Keep all follow-up visits as told by your health care provider. This is important. Contact a health care provider if:  Your pain is not controlled with medicine.  You have trouble swallowing.  You have heartburn or indigestion.  You feel nauseous or you vomit.  You cannot have a bowel movement (constipated) or have diarrhea.  You have a new  cough or hoarseness.  You cough or choke while eating or drinking.  You have more redness, swelling, or pain around your incision.  You have more fluid or blood coming from your incision.  Your incision feels warm to the touch.  You have pus or a bad smell coming from your incision.  You have a fever or chills. Get help right away if:  Your dressing becomes soaked with  blood.  Your pain becomes severe.  You have pain, tenderness, or redness in your calf.  You are unable to swallow.  You have chest pain.  You have difficulty breathing. This information is not intended to replace advice given to you by your health care provider. Make sure you discuss any questions you have with your health care provider. Document Released: 01/12/2012 Document Revised: 08/07/2015 Document Reviewed: 05/22/2015 Elsevier Interactive Patient Education  2018 [place] POST-ANESTHESIA  IMMEDIATELY FOLLOWING SURGERY:  Do not drive or operate machinery for the first twenty four hours after surgery.  Do not make any important decisions for twenty four hours after surgery or while taking narcotic pain medications or sedatives.  If you develop intractable nausea and vomiting or a severe headache please notify your doctor immediately.  FOLLOW-UP:  Please make an appointment with your surgeon as instructed. You do not need to follow up with anesthesia unless specifically instructed to do so.  WOUND CARE INSTRUCTIONS (if applicable):  Keep a dry clean dressing on the anesthesia/puncture wound site if there is drainage.  Once the wound has quit draining you may leave it open to air.  Generally you should leave the bandage intact for twenty four hours unless there is drainage.  If the epidural site drains for more than 36-48 hours please call the anesthesia department.  QUESTIONS?:  Please feel free to call your physician or the hospital operator if you have any questions, and they will be happy to assist you.

## 2017-01-15 NOTE — OR Nursing (Signed)
Multiple sores noted to both arms , and right leg and back of neck.   She states that's been there a long time and she sees Dr. Nevada Crane for the skin issues.  Reported to Dr. Patsey Berthold and   And  Dr.Rehman

## 2017-01-15 NOTE — Progress Notes (Signed)
Patient has mild expiratory wheezing, patient breathing regular and able to talk and drink.  Dr. Patsey Berthold notified.  Per Dr. Patsey Berthold patient instructed to use breathing treatments once at home.

## 2017-01-15 NOTE — Transfer of Care (Signed)
Immediate Anesthesia Transfer of Care Note  Patient: [name]  Procedure(s) Performed: Procedure(s) with comments: ESOPHAGOGASTRODUODENOSCOPY (EGD) WITH PROPOFOL (N/A) - 9:15 ESOPHAGEAL DILATION (N/A)  Patient Location: PACU  Anesthesia Type:MAC  Level of Consciousness: drowsy  Airway & Oxygen Therapy: Patient Spontanous Breathing and Patient connected to face mask oxygen  Post-op Assessment: Report given to RN and Post -op Vital signs reviewed and stable  Post vital signs: Reviewed and stable  Last Vitals:  Vitals:   01/15/17 0930 01/15/17 0935  BP:    Pulse:    Resp: (!) 61 (!) 35  Temp:      Last Pain:  Vitals:   01/15/17 0819  TempSrc: Oral      Patients Stated Pain Goal: 6 (46/65/99 3570)  Complications: No apparent anesthesia complications

## 2017-01-19 ENCOUNTER — Encounter (HOSPITAL_COMMUNITY): Payer: Self-pay | Admitting: Internal Medicine

## 2017-01-26 ENCOUNTER — Ambulatory Visit (INDEPENDENT_AMBULATORY_CARE_PROVIDER_SITE_OTHER): Payer: PPO | Admitting: *Deleted

## 2017-01-26 ENCOUNTER — Telehealth: Payer: Self-pay | Admitting: Cardiology

## 2017-01-26 DIAGNOSIS — I495 Sick sinus syndrome: Secondary | ICD-10-CM | POA: Diagnosis not present

## 2017-01-26 NOTE — Telephone Encounter (Signed)
Spoke with pt and reminded pt of remote transmission that is due today. Pt verbalized understanding.   

## 2017-01-26 NOTE — Progress Notes (Signed)
Remote pacemaker transmission.   

## 2017-01-29 ENCOUNTER — Encounter: Payer: Self-pay | Admitting: Cardiology

## 2017-02-05 DIAGNOSIS — J441 Chronic obstructive pulmonary disease with (acute) exacerbation: Secondary | ICD-10-CM | POA: Diagnosis not present

## 2017-02-05 DIAGNOSIS — I10 Essential (primary) hypertension: Secondary | ICD-10-CM | POA: Diagnosis not present

## 2017-02-05 DIAGNOSIS — M545 Low back pain: Secondary | ICD-10-CM | POA: Diagnosis not present

## 2017-02-16 DIAGNOSIS — Z6823 Body mass index (BMI) 23.0-23.9, adult: Secondary | ICD-10-CM | POA: Diagnosis not present

## 2017-02-16 DIAGNOSIS — J440 Chronic obstructive pulmonary disease with acute lower respiratory infection: Secondary | ICD-10-CM | POA: Diagnosis not present

## 2017-02-16 DIAGNOSIS — M545 Low back pain: Secondary | ICD-10-CM | POA: Diagnosis not present

## 2017-02-16 DIAGNOSIS — K257 Chronic gastric ulcer without hemorrhage or perforation: Secondary | ICD-10-CM | POA: Diagnosis not present

## 2017-02-17 NOTE — Progress Notes (Signed)
Patient has an appointment with Terri on 02/24/17.

## 2017-02-22 LAB — CUP PACEART REMOTE DEVICE CHECK
Battery Remaining Longevity: 72 mo
Battery Voltage: 2.87 V
Brady Statistic RA Percent Paced: 58 %
Brady Statistic RV Percent Paced: 1.8 %
Date Time Interrogation Session: 20180703165658
Implantable Lead Implant Date: 20100908
Implantable Lead Location: 753859
Implantable Lead Location: 753860
Implantable Pulse Generator Implant Date: 20100908
Lead Channel Impedance Value: 450 Ohm
Lead Channel Pacing Threshold Amplitude: 0.5 V
Lead Channel Pacing Threshold Pulse Width: 0.4 ms
Lead Channel Pacing Threshold Pulse Width: 0.5 ms
Lead Channel Setting Pacing Amplitude: 0.75 V
Lead Channel Setting Pacing Amplitude: 1.5 V
Lead Channel Setting Sensing Sensitivity: 3 mV
MDC IDC LEAD IMPLANT DT: 20100908
MDC IDC MSMT BATTERY REMAINING PERCENTAGE: 51 %
MDC IDC MSMT LEADCHNL RA SENSING INTR AMPL: 3.8 mV
MDC IDC MSMT LEADCHNL RV IMPEDANCE VALUE: 700 Ohm
MDC IDC MSMT LEADCHNL RV PACING THRESHOLD AMPLITUDE: 0.5 V
MDC IDC MSMT LEADCHNL RV SENSING INTR AMPL: 9.1 mV
MDC IDC SET LEADCHNL RV PACING PULSEWIDTH: 0.4 ms
MDC IDC STAT BRADY AP VP PERCENT: 1.7 %
MDC IDC STAT BRADY AP VS PERCENT: 56 %
MDC IDC STAT BRADY AS VP PERCENT: 1 %
MDC IDC STAT BRADY AS VS PERCENT: 42 %
Pulse Gen Model: 2110
Pulse Gen Serial Number: 2300232

## 2017-02-24 ENCOUNTER — Encounter (INDEPENDENT_AMBULATORY_CARE_PROVIDER_SITE_OTHER): Payer: Self-pay | Admitting: Internal Medicine

## 2017-02-24 ENCOUNTER — Ambulatory Visit (INDEPENDENT_AMBULATORY_CARE_PROVIDER_SITE_OTHER): Payer: PPO | Admitting: Internal Medicine

## 2017-02-24 VITALS — BP 120/70 | HR 80 | Temp 98.0°F | Ht 62.0 in | Wt 128.3 lb

## 2017-02-24 DIAGNOSIS — R1310 Dysphagia, unspecified: Secondary | ICD-10-CM | POA: Diagnosis not present

## 2017-02-24 DIAGNOSIS — R1319 Other dysphagia: Secondary | ICD-10-CM

## 2017-02-24 NOTE — Patient Instructions (Signed)
Will discuss with Dr.Rehman. 

## 2017-02-24 NOTE — Progress Notes (Signed)
Subjective:    Patient ID: [name], female    DOB: [birth year], 71 y.o.   MRN: [id number]  HPI  Here today for f/u. Underwent and EGD in June of this year for dysphagia.Impression:               - Non-bleeding esophageal ulcers involving proximal                            third. Biopsied. Cells for cytology obtained.                           - Normal mid esophagus and distal esophagus.                           - Z-line irregular, 36 cm from the incisors. Tight                            LES. Dilated with balloon dilator to 18 mm                           - A 694 degree fundoplication was found. The wrap                            appears tight.                           - Non-bleeding gastric ulcers with no stigmata of                            bleeding. Biopsied.                           - Normal duodenal bulb.                           - Duodenal deformity.                           - Normal second portion of the duodenum. She tells me she is doing good. Sometimes she has pain after she swallows. She says she cannot eat apples.  She says foods still feel like they are lodging.  She says she doesn't feel any better. Appetite is okay. She is eating one meal a day. She says meats are also bother ing her. She points to her lower sternum is to where foods are lodging She is having a BM daily.   Review of Systems Past Medical History:  Diagnosis Date  . Chronic pain   . COPD with emphysema ([place])   . Depression   . GERD (gastroesophageal reflux disease)   . Hypertension   . Mitral and aortic regurgitation    Mild  . Neurodermatitis   . Peptic ulcer   . Sick sinus syndrome ([facility])    s/p PPM ([facility])  . Syncope     Past Surgical History:  Procedure Laterality Date  . APPENDECTOMY    . CARPAL TUNNEL RELEASE    . CHOLECYSTECTOMY    . ESOPHAGEAL DILATION N/A 01/15/2017   Procedure: ESOPHAGEAL DILATION;  Surgeon:  Rogene Houston, MD;  Location: [facility];  Service:  Endoscopy;  Laterality: N/A;  . ESOPHAGOGASTRODUODENOSCOPY (EGD) WITH PROPOFOL N/A 01/15/2017   Procedure: ESOPHAGOGASTRODUODENOSCOPY (EGD) WITH PROPOFOL;  Surgeon: Rogene Houston, MD;  Location: [facility];  Service: Endoscopy;  Laterality: N/A;  9:15  . NISSEN FUNDOPLICATION    . PACEMAKER INSERTION     [employer]  . SHOULDER SURGERY    . SPLENECTOMY      No Known Allergies  Current Outpatient Prescriptions on File Prior to Visit  Medication Sig Dispense Refill  . ALPRAZolam (XANAX) 1 MG tablet Take 1 mg by mouth 3 (three) times daily.      . cholecalciferol (VITAMIN D) 1000 units tablet Take 1,000 Units by mouth daily.  0  . folic acid (FOLVITE) 1 MG tablet Take 1 mg by mouth daily.  1  . gabapentin (NEURONTIN) 400 MG capsule Take 400 mg by mouth 3 (three) times daily.    Marland Kitchen ipratropium (ATROVENT) 0.02 % nebulizer solution Take 0.25 mg by nebulization every 4 (four) hours as needed (for wheezing/shortness of breath).     . methotrexate 2.5 MG tablet Take 10 mg by mouth every Friday.     . morphine (MSIR) 15 MG tablet Take 15 mg by mouth 3 (three) times daily.      . Omega-3 Fatty Acids (FISH OIL) 1000 MG CAPS Take 1,000 mg by mouth 3 (three) times daily.    Marland Kitchen omeprazole (PRILOSEC) 40 MG capsule Take 40 mg by mouth daily.    . Potassium 99 MG TABS Take 99 mg by mouth 2 (two) times daily as needed (for cramps).    . ranitidine (ZANTAC) 300 MG tablet Take 300 mg by mouth at bedtime.    . sucralfate (CARAFATE) 1 GM/10ML suspension Take 10 mLs (1 g total) by mouth 4 (four) times daily. 420 mL 1  . tetrahydrozoline-zinc (VISINE-AC) 0.05-0.25 % ophthalmic solution Place 1-2 drops into both eyes 3 (three) times daily as needed (for runny eyes.).    Marland Kitchen tiZANidine (ZANAFLEX) 2 MG tablet Take 2 mg by mouth 2 (two) times daily.   0  . traZODone (DESYREL) 50 MG tablet Take 25 mg by mouth at bedtime.     . triamcinolone cream (KENALOG) 0.1 % Apply 1 application topically 2 (two) times daily as  needed (for skin irritation.).     No current facility-administered medications on file prior to visit.         Objective:   Physical Exam Blood pressure 120/70, pulse 80, temperature 98 F (36.7 C), height 5\' 2"  (1.575 m), weight 128 lb 4.8 oz (58.2 kg). Alert and oriented. Skin warm and dry. Oral mucosa is moist.   . Sclera anicteric, conjunctivae is pink. Thyroid not enlarged. No cervical lymphadenopathy. Lungs clear. Heart regular rate and rhythm.  Abdomen is soft. Bowel sounds are positive. No hepatomegaly. No abdominal masses felt. No tenderness.  No edema to lower extremities.  .        Assessment & Plan:  Dysphagia. Will discuss with Dr. Laural Golden

## 2017-03-08 DIAGNOSIS — I10 Essential (primary) hypertension: Secondary | ICD-10-CM | POA: Diagnosis not present

## 2017-03-08 DIAGNOSIS — M545 Low back pain: Secondary | ICD-10-CM | POA: Diagnosis not present

## 2017-03-08 DIAGNOSIS — J441 Chronic obstructive pulmonary disease with (acute) exacerbation: Secondary | ICD-10-CM | POA: Diagnosis not present

## 2017-03-10 ENCOUNTER — Telehealth (INDEPENDENT_AMBULATORY_CARE_PROVIDER_SITE_OTHER): Payer: Self-pay | Admitting: Internal Medicine

## 2017-03-10 NOTE — Telephone Encounter (Signed)
[name], Needs Esophageal manometry

## 2017-03-10 NOTE — Telephone Encounter (Signed)
Referral and notes faxed to [facility], they will contact patient directly to schedule

## 2017-03-15 ENCOUNTER — Telehealth (INDEPENDENT_AMBULATORY_CARE_PROVIDER_SITE_OTHER): Payer: Self-pay | Admitting: Internal Medicine

## 2017-03-15 NOTE — Telephone Encounter (Signed)
Patient called, left a message stating that she needed to talk to Victoria, she did not leave any details and was very hard to understand.  [phone]

## 2017-03-15 NOTE — Telephone Encounter (Signed)
Addressed.

## 2017-04-01 ENCOUNTER — Telehealth (INDEPENDENT_AMBULATORY_CARE_PROVIDER_SITE_OTHER): Payer: Self-pay | Admitting: Internal Medicine

## 2017-04-01 NOTE — Telephone Encounter (Signed)
Patient called, stated that you were sending her to [facility], but they are out of network for her insurance and she can't afford to pay out of network.  She is wanting to know if there is somewhere else she can go.  [phone]

## 2017-04-02 NOTE — Telephone Encounter (Signed)
Will try [place].

## 2017-04-23 DIAGNOSIS — K257 Chronic gastric ulcer without hemorrhage or perforation: Secondary | ICD-10-CM | POA: Diagnosis not present

## 2017-04-23 DIAGNOSIS — J440 Chronic obstructive pulmonary disease with acute lower respiratory infection: Secondary | ICD-10-CM | POA: Diagnosis not present

## 2017-04-23 DIAGNOSIS — Z6823 Body mass index (BMI) 23.0-23.9, adult: Secondary | ICD-10-CM | POA: Diagnosis not present

## 2017-04-23 DIAGNOSIS — M545 Low back pain: Secondary | ICD-10-CM | POA: Diagnosis not present

## 2017-04-27 ENCOUNTER — Telehealth: Payer: Self-pay | Admitting: Cardiology

## 2017-04-27 ENCOUNTER — Ambulatory Visit (INDEPENDENT_AMBULATORY_CARE_PROVIDER_SITE_OTHER): Payer: PPO | Admitting: *Deleted

## 2017-04-27 DIAGNOSIS — I495 Sick sinus syndrome: Secondary | ICD-10-CM

## 2017-04-27 NOTE — Telephone Encounter (Signed)
Confirmed remote transmission w/ pt family member.   

## 2017-04-27 NOTE — Progress Notes (Signed)
Remote pacemaker transmission.   

## 2017-04-28 ENCOUNTER — Encounter: Payer: Self-pay | Admitting: Cardiology

## 2017-05-03 LAB — CUP PACEART REMOTE DEVICE CHECK
Battery Remaining Longevity: 62 mo
Battery Remaining Percentage: 46 %
Battery Voltage: 2.86 V
Brady Statistic AP VP Percent: 1.8 %
Brady Statistic AP VS Percent: 57 %
Brady Statistic AS VS Percent: 41 %
Brady Statistic RV Percent Paced: 1.8 %
Date Time Interrogation Session: 20181002140528
Implantable Lead Implant Date: 20100908
Implantable Lead Location: 753860
Implantable Lead Model: 1944
Implantable Pulse Generator Implant Date: 20100908
Lead Channel Impedance Value: 540 Ohm
Lead Channel Pacing Threshold Amplitude: 0.5 V
Lead Channel Pacing Threshold Pulse Width: 0.4 ms
Lead Channel Sensing Intrinsic Amplitude: 3.5 mV
Lead Channel Setting Pacing Amplitude: 0.75 V
MDC IDC LEAD IMPLANT DT: 20100908
MDC IDC LEAD LOCATION: 753859
MDC IDC MSMT LEADCHNL RA IMPEDANCE VALUE: 440 Ohm
MDC IDC MSMT LEADCHNL RA PACING THRESHOLD AMPLITUDE: 0.625 V
MDC IDC MSMT LEADCHNL RA PACING THRESHOLD PULSEWIDTH: 0.5 ms
MDC IDC MSMT LEADCHNL RV SENSING INTR AMPL: 9.4 mV
MDC IDC SET LEADCHNL RA PACING AMPLITUDE: 1.625
MDC IDC SET LEADCHNL RV PACING PULSEWIDTH: 0.4 ms
MDC IDC SET LEADCHNL RV SENSING SENSITIVITY: 3 mV
MDC IDC STAT BRADY AS VP PERCENT: 1 %
MDC IDC STAT BRADY RA PERCENT PACED: 58 %
Pulse Gen Model: 2110
Pulse Gen Serial Number: 2300232

## 2017-06-24 DIAGNOSIS — F411 Generalized anxiety disorder: Secondary | ICD-10-CM | POA: Diagnosis not present

## 2017-06-24 DIAGNOSIS — K253 Acute gastric ulcer without hemorrhage or perforation: Secondary | ICD-10-CM | POA: Diagnosis not present

## 2017-06-24 DIAGNOSIS — Z6823 Body mass index (BMI) 23.0-23.9, adult: Secondary | ICD-10-CM | POA: Diagnosis not present

## 2017-06-24 DIAGNOSIS — Z0000 Encounter for general adult medical examination without abnormal findings: Secondary | ICD-10-CM | POA: Diagnosis not present

## 2017-06-24 DIAGNOSIS — J441 Chronic obstructive pulmonary disease with (acute) exacerbation: Secondary | ICD-10-CM | POA: Diagnosis not present

## 2017-06-24 DIAGNOSIS — Z1389 Encounter for screening for other disorder: Secondary | ICD-10-CM | POA: Diagnosis not present

## 2017-06-24 DIAGNOSIS — M545 Low back pain: Secondary | ICD-10-CM | POA: Diagnosis not present

## 2017-07-12 DIAGNOSIS — M545 Low back pain: Secondary | ICD-10-CM | POA: Diagnosis not present

## 2017-07-12 DIAGNOSIS — J441 Chronic obstructive pulmonary disease with (acute) exacerbation: Secondary | ICD-10-CM | POA: Diagnosis not present

## 2017-07-12 DIAGNOSIS — F411 Generalized anxiety disorder: Secondary | ICD-10-CM | POA: Diagnosis not present

## 2017-07-28 ENCOUNTER — Telehealth: Payer: Self-pay | Admitting: Cardiology

## 2017-07-28 ENCOUNTER — Ambulatory Visit (INDEPENDENT_AMBULATORY_CARE_PROVIDER_SITE_OTHER): Payer: PPO | Admitting: *Deleted

## 2017-07-28 DIAGNOSIS — I495 Sick sinus syndrome: Secondary | ICD-10-CM

## 2017-07-28 NOTE — Telephone Encounter (Signed)
LMOVM reminding pt to send remote transmission.   

## 2017-07-29 NOTE — Progress Notes (Signed)
Remote pacemaker transmission.   

## 2017-07-30 ENCOUNTER — Encounter: Payer: Self-pay | Admitting: Cardiology

## 2017-07-30 ENCOUNTER — Encounter: Payer: PPO | Admitting: Internal Medicine

## 2017-08-02 DIAGNOSIS — F411 Generalized anxiety disorder: Secondary | ICD-10-CM | POA: Diagnosis not present

## 2017-08-02 DIAGNOSIS — J441 Chronic obstructive pulmonary disease with (acute) exacerbation: Secondary | ICD-10-CM | POA: Diagnosis not present

## 2017-08-02 DIAGNOSIS — M545 Low back pain: Secondary | ICD-10-CM | POA: Diagnosis not present

## 2017-08-13 LAB — CUP PACEART REMOTE DEVICE CHECK
Battery Remaining Longevity: 62 mo
Battery Remaining Percentage: 46 %
Brady Statistic AP VS Percent: 58 %
Brady Statistic AS VP Percent: 1 %
Brady Statistic RA Percent Paced: 59 %
Brady Statistic RV Percent Paced: 1.6 %
Date Time Interrogation Session: 20190102151217
Implantable Lead Implant Date: 20100908
Implantable Lead Location: 753860
Implantable Lead Model: 1944
Implantable Lead Model: 1948
Lead Channel Impedance Value: 410 Ohm
Lead Channel Pacing Threshold Pulse Width: 0.5 ms
Lead Channel Sensing Intrinsic Amplitude: 3.5 mV
Lead Channel Sensing Intrinsic Amplitude: 8.5 mV
Lead Channel Setting Pacing Pulse Width: 0.4 ms
Lead Channel Setting Sensing Sensitivity: 3 mV
MDC IDC LEAD IMPLANT DT: 20100908
MDC IDC LEAD LOCATION: 753859
MDC IDC MSMT BATTERY VOLTAGE: 2.86 V
MDC IDC MSMT LEADCHNL RA PACING THRESHOLD AMPLITUDE: 0.625 V
MDC IDC MSMT LEADCHNL RV IMPEDANCE VALUE: 540 Ohm
MDC IDC MSMT LEADCHNL RV PACING THRESHOLD AMPLITUDE: 0.625 V
MDC IDC MSMT LEADCHNL RV PACING THRESHOLD PULSEWIDTH: 0.4 ms
MDC IDC PG IMPLANT DT: 20100908
MDC IDC PG SERIAL: 2300232
MDC IDC SET LEADCHNL RA PACING AMPLITUDE: 1.625
MDC IDC SET LEADCHNL RV PACING AMPLITUDE: 0.875
MDC IDC STAT BRADY AP VP PERCENT: 1.6 %
MDC IDC STAT BRADY AS VS PERCENT: 41 %
Pulse Gen Model: 2110

## 2017-08-16 DIAGNOSIS — Z1231 Encounter for screening mammogram for malignant neoplasm of breast: Secondary | ICD-10-CM | POA: Diagnosis not present

## 2017-08-26 DIAGNOSIS — J441 Chronic obstructive pulmonary disease with (acute) exacerbation: Secondary | ICD-10-CM | POA: Diagnosis not present

## 2017-08-26 DIAGNOSIS — Z6822 Body mass index (BMI) 22.0-22.9, adult: Secondary | ICD-10-CM | POA: Diagnosis not present

## 2017-08-26 DIAGNOSIS — F418 Other specified anxiety disorders: Secondary | ICD-10-CM | POA: Diagnosis not present

## 2017-08-26 DIAGNOSIS — M545 Low back pain: Secondary | ICD-10-CM | POA: Diagnosis not present

## 2017-08-27 ENCOUNTER — Encounter: Payer: PPO | Admitting: Internal Medicine

## 2017-08-27 DIAGNOSIS — R109 Unspecified abdominal pain: Secondary | ICD-10-CM | POA: Diagnosis not present

## 2017-09-08 DIAGNOSIS — M545 Low back pain: Secondary | ICD-10-CM | POA: Diagnosis not present

## 2017-09-08 DIAGNOSIS — J441 Chronic obstructive pulmonary disease with (acute) exacerbation: Secondary | ICD-10-CM | POA: Diagnosis not present

## 2017-10-14 DIAGNOSIS — D508 Other iron deficiency anemias: Secondary | ICD-10-CM | POA: Diagnosis not present

## 2017-10-14 DIAGNOSIS — D509 Iron deficiency anemia, unspecified: Secondary | ICD-10-CM | POA: Diagnosis not present

## 2017-10-14 DIAGNOSIS — R220 Localized swelling, mass and lump, head: Secondary | ICD-10-CM | POA: Diagnosis not present

## 2017-10-14 DIAGNOSIS — Z888 Allergy status to other drugs, medicaments and biological substances status: Secondary | ICD-10-CM | POA: Diagnosis not present

## 2017-10-14 DIAGNOSIS — Z811 Family history of alcohol abuse and dependence: Secondary | ICD-10-CM | POA: Diagnosis not present

## 2017-10-14 DIAGNOSIS — Z800 Family history of malignant neoplasm of digestive organs: Secondary | ICD-10-CM | POA: Diagnosis not present

## 2017-10-14 DIAGNOSIS — J441 Chronic obstructive pulmonary disease with (acute) exacerbation: Secondary | ICD-10-CM | POA: Diagnosis not present

## 2017-10-14 DIAGNOSIS — Z806 Family history of leukemia: Secondary | ICD-10-CM | POA: Diagnosis not present

## 2017-10-14 DIAGNOSIS — K219 Gastro-esophageal reflux disease without esophagitis: Secondary | ICD-10-CM | POA: Diagnosis not present

## 2017-10-14 DIAGNOSIS — G8929 Other chronic pain: Secondary | ICD-10-CM | POA: Diagnosis not present

## 2017-10-14 DIAGNOSIS — I639 Cerebral infarction, unspecified: Secondary | ICD-10-CM | POA: Diagnosis not present

## 2017-10-14 DIAGNOSIS — M545 Low back pain: Secondary | ICD-10-CM | POA: Diagnosis not present

## 2017-10-14 DIAGNOSIS — F411 Generalized anxiety disorder: Secondary | ICD-10-CM | POA: Diagnosis not present

## 2017-10-14 DIAGNOSIS — E039 Hypothyroidism, unspecified: Secondary | ICD-10-CM | POA: Diagnosis not present

## 2017-10-14 DIAGNOSIS — Z9181 History of falling: Secondary | ICD-10-CM | POA: Diagnosis not present

## 2017-10-14 DIAGNOSIS — R262 Difficulty in walking, not elsewhere classified: Secondary | ICD-10-CM | POA: Diagnosis not present

## 2017-10-14 DIAGNOSIS — J449 Chronic obstructive pulmonary disease, unspecified: Secondary | ICD-10-CM | POA: Diagnosis not present

## 2017-10-14 DIAGNOSIS — Z79899 Other long term (current) drug therapy: Secondary | ICD-10-CM | POA: Diagnosis not present

## 2017-10-14 DIAGNOSIS — Z87891 Personal history of nicotine dependence: Secondary | ICD-10-CM | POA: Diagnosis not present

## 2017-10-14 DIAGNOSIS — E038 Other specified hypothyroidism: Secondary | ICD-10-CM | POA: Diagnosis not present

## 2017-10-14 DIAGNOSIS — Z886 Allergy status to analgesic agent status: Secondary | ICD-10-CM | POA: Diagnosis not present

## 2017-10-14 DIAGNOSIS — G459 Transient cerebral ischemic attack, unspecified: Secondary | ICD-10-CM | POA: Diagnosis not present

## 2017-10-15 ENCOUNTER — Encounter: Payer: PPO | Admitting: Internal Medicine

## 2017-10-15 DIAGNOSIS — E038 Other specified hypothyroidism: Secondary | ICD-10-CM | POA: Diagnosis not present

## 2017-10-15 DIAGNOSIS — F411 Generalized anxiety disorder: Secondary | ICD-10-CM | POA: Diagnosis not present

## 2017-10-15 DIAGNOSIS — D508 Other iron deficiency anemias: Secondary | ICD-10-CM | POA: Diagnosis not present

## 2017-10-15 DIAGNOSIS — G459 Transient cerebral ischemic attack, unspecified: Secondary | ICD-10-CM | POA: Diagnosis not present

## 2017-10-15 DIAGNOSIS — J441 Chronic obstructive pulmonary disease with (acute) exacerbation: Secondary | ICD-10-CM | POA: Diagnosis not present

## 2017-10-21 DIAGNOSIS — Z6821 Body mass index (BMI) 21.0-21.9, adult: Secondary | ICD-10-CM | POA: Diagnosis not present

## 2017-10-21 DIAGNOSIS — G459 Transient cerebral ischemic attack, unspecified: Secondary | ICD-10-CM | POA: Diagnosis not present

## 2017-10-21 DIAGNOSIS — E038 Other specified hypothyroidism: Secondary | ICD-10-CM | POA: Diagnosis not present

## 2017-10-22 ENCOUNTER — Other Ambulatory Visit: Payer: Self-pay

## 2017-10-22 NOTE — Patient Outreach (Signed)
[facility]) Care Management  10/22/2017  [name] [birth year] [id number]  Transition of care  Referral date: 10/20/17 Referral source: discharged from [facility] on 10/15/17 Insurance:  Health team advantage  Telephone call to patient regarding transition of care follow up. HIPAA verified with patient. Discussed transition of care program with patient. Patient gave verbal authorization to speak with her husband, [name] regarding her health information.  Patient states, " I was in the hospital because my thyroid was out of wack."  Patient states the doctor sent me home with 2 prescriptions. Husband states patient was only in the hospital overnight for testing. Patient states she is not having any new symptoms.  States she is feeling better since being on the medication. Patient reports she saw her primary MD on yesterday and there was no change in the treatment. Patient states she is independent in her care and is able to drive to her appointments. Patient states she does not need any further follow up with a nurse. Refused ongoing transition of care calls.  Patient denies any needs or concerns.  RNCM advised patient to continue to take her medications as prescribed.  RNCM advised patient to notify MD of any changes in condition prior to scheduled appointment. RNCM gave contact name and number: [phone] or main office number [phone] and 24 hour nurse advise line [phone].  RNCM verified patient aware of 911 services for urgent/ emergent needs.  ASSESSMENT: Medication reviewed with patient.    PLAN: RNCM will close patient due to patient refusing [facility] services.  RNCM will send patients primary MD notification of closure.  RNCM will send patient [facility] outreach letter/ brochure.   Quinn Plowman RN,BSN,CCM [facility] Telephonic  [phone]

## 2017-10-27 ENCOUNTER — Ambulatory Visit (INDEPENDENT_AMBULATORY_CARE_PROVIDER_SITE_OTHER): Payer: PPO | Admitting: *Deleted

## 2017-10-27 DIAGNOSIS — I495 Sick sinus syndrome: Secondary | ICD-10-CM

## 2017-10-27 NOTE — Progress Notes (Signed)
Remote pacemaker transmission.   

## 2017-10-28 ENCOUNTER — Encounter: Payer: Self-pay | Admitting: Cardiology

## 2017-11-03 DIAGNOSIS — I10 Essential (primary) hypertension: Secondary | ICD-10-CM | POA: Diagnosis not present

## 2017-11-03 DIAGNOSIS — J441 Chronic obstructive pulmonary disease with (acute) exacerbation: Secondary | ICD-10-CM | POA: Diagnosis not present

## 2017-11-03 DIAGNOSIS — M545 Low back pain: Secondary | ICD-10-CM | POA: Diagnosis not present

## 2017-11-18 LAB — CUP PACEART REMOTE DEVICE CHECK
Battery Voltage: 2.84 V
Brady Statistic AP VS Percent: 60 %
Brady Statistic AS VP Percent: 1 %
Brady Statistic AS VS Percent: 38 %
Date Time Interrogation Session: 20190403131115
Implantable Lead Implant Date: 20100908
Implantable Lead Location: 753859
Lead Channel Pacing Threshold Amplitude: 0.5 V
Lead Channel Pacing Threshold Amplitude: 0.625 V
Lead Channel Pacing Threshold Pulse Width: 0.4 ms
Lead Channel Pacing Threshold Pulse Width: 0.5 ms
Lead Channel Sensing Intrinsic Amplitude: 7.6 mV
Lead Channel Setting Pacing Amplitude: 0.75 V
MDC IDC LEAD IMPLANT DT: 20100908
MDC IDC LEAD LOCATION: 753860
MDC IDC MSMT BATTERY REMAINING LONGEVITY: 54 mo
MDC IDC MSMT BATTERY REMAINING PERCENTAGE: 40 %
MDC IDC MSMT LEADCHNL RA IMPEDANCE VALUE: 440 Ohm
MDC IDC MSMT LEADCHNL RA SENSING INTR AMPL: 3.4 mV
MDC IDC MSMT LEADCHNL RV IMPEDANCE VALUE: 490 Ohm
MDC IDC PG IMPLANT DT: 20100908
MDC IDC SET LEADCHNL RA PACING AMPLITUDE: 1.625
MDC IDC SET LEADCHNL RV PACING PULSEWIDTH: 0.4 ms
MDC IDC SET LEADCHNL RV SENSING SENSITIVITY: 3 mV
MDC IDC STAT BRADY AP VP PERCENT: 1.6 %
MDC IDC STAT BRADY RA PERCENT PACED: 61 %
MDC IDC STAT BRADY RV PERCENT PACED: 1.6 %
Pulse Gen Model: 2110
Pulse Gen Serial Number: 2300232

## 2017-11-26 ENCOUNTER — Encounter: Payer: Self-pay | Admitting: Internal Medicine

## 2017-11-26 ENCOUNTER — Ambulatory Visit: Payer: PPO | Admitting: Internal Medicine

## 2017-11-26 DIAGNOSIS — I495 Sick sinus syndrome: Secondary | ICD-10-CM | POA: Diagnosis not present

## 2017-11-26 LAB — CUP PACEART INCLINIC DEVICE CHECK
Battery Voltage: 2.84 V
Brady Statistic RA Percent Paced: 60 %
Date Time Interrogation Session: 20190503130710
Implantable Lead Implant Date: 20100908
Implantable Lead Location: 753860
Implantable Lead Model: 1944
Lead Channel Pacing Threshold Amplitude: 0.5 V
Lead Channel Pacing Threshold Pulse Width: 0.4 ms
Lead Channel Pacing Threshold Pulse Width: 0.5 ms
Lead Channel Sensing Intrinsic Amplitude: 4.2 mV
Lead Channel Setting Pacing Amplitude: 1.625
Lead Channel Setting Sensing Sensitivity: 3 mV
MDC IDC LEAD IMPLANT DT: 20100908
MDC IDC LEAD LOCATION: 753859
MDC IDC MSMT BATTERY REMAINING LONGEVITY: 56 mo
MDC IDC MSMT LEADCHNL RA IMPEDANCE VALUE: 487.5 Ohm
MDC IDC MSMT LEADCHNL RA PACING THRESHOLD AMPLITUDE: 0.5 V
MDC IDC MSMT LEADCHNL RA PACING THRESHOLD PULSEWIDTH: 0.5 ms
MDC IDC MSMT LEADCHNL RV IMPEDANCE VALUE: 537.5 Ohm
MDC IDC MSMT LEADCHNL RV PACING THRESHOLD AMPLITUDE: 0.75 V
MDC IDC MSMT LEADCHNL RV PACING THRESHOLD AMPLITUDE: 0.75 V
MDC IDC MSMT LEADCHNL RV PACING THRESHOLD PULSEWIDTH: 0.4 ms
MDC IDC MSMT LEADCHNL RV SENSING INTR AMPL: 9.2 mV
MDC IDC PG IMPLANT DT: 20100908
MDC IDC SET LEADCHNL RV PACING AMPLITUDE: 0.75 V
MDC IDC SET LEADCHNL RV PACING PULSEWIDTH: 0.4 ms
MDC IDC STAT BRADY RV PERCENT PACED: 1.5 %
Pulse Gen Model: 2110
Pulse Gen Serial Number: 2300232

## 2017-11-26 NOTE — Patient Instructions (Addendum)
Medication Instructions:   Your physician recommends that you continue on your current medications as directed. Please refer to the Current Medication list given to you today.  Labwork:  NONE  Testing/Procedures:  NONE  Follow-Up: Your physician recommends that you schedule a follow-up appointment in: 1 year with Dr. Rayann Heman. Please schedule this appointment today before leaving the office.   Any Other Special Instructions Will Be Listed Below (If Applicable).  Your next device check from home is on 01/26/18  If you need a refill on your cardiac medications before your next appointment, please call your pharmacy.

## 2017-11-26 NOTE — Progress Notes (Signed)
PCP: Neale Burly, MD Primary Cardiologist:  Dr Domenic Polite Primary EP:  Dr Rayann Heman  [name] is a 72 y.o. female who presents today for routine electrophysiology followup.  Since last being seen in our clinic, the patient reports doing very well.  Today, she denies symptoms of palpitations, chest pain, shortness of breath,  lower extremity edema, dizziness, presyncope, or syncope.  The patient is otherwise without complaint today.   Past Medical History:  Diagnosis Date  . Chronic pain   . COPD with emphysema ([place])   . Depression   . GERD (gastroesophageal reflux disease)   . Hypertension   . Mitral and aortic regurgitation    Mild  . Neurodermatitis   . Peptic ulcer   . Sick sinus syndrome (HCC)    s/p PPM (SJM)  . Syncope    Past Surgical History:  Procedure Laterality Date  . APPENDECTOMY    . CARPAL TUNNEL RELEASE    . CHOLECYSTECTOMY    . ESOPHAGEAL DILATION N/A 01/15/2017   Procedure: ESOPHAGEAL DILATION;  Surgeon: Rogene Houston, MD;  [facility];  Service: Endoscopy;  Laterality: N/A;  . ESOPHAGOGASTRODUODENOSCOPY (EGD) WITH PROPOFOL N/A 01/15/2017   Procedure: ESOPHAGOGASTRODUODENOSCOPY (EGD) WITH PROPOFOL;  Surgeon: Rogene Houston, MD;  [facility];  Service: Endoscopy;  Laterality: N/A;  9:15  . NISSEN FUNDOPLICATION    . PACEMAKER INSERTION     St Jude  . SHOULDER SURGERY    . SPLENECTOMY      ROS- all systems are reviewed and negative except as per HPI above  Current Outpatient Medications  Medication Sig Dispense Refill  . ALPRAZolam (XANAX) 1 MG tablet Take 1 mg by mouth 3 (three) times daily.      . Cholecalciferol (VITAMIN D3) 1000 units CAPS Take 1 capsule by mouth daily.  0  . folic acid (FOLVITE) 1 MG tablet Take 1 mg by mouth daily.  1  . gabapentin (NEURONTIN) 400 MG capsule Take 400 mg by mouth 3 (three) times daily.    Marland Kitchen ipratropium (ATROVENT) 0.02 % nebulizer solution Take 0.25 mg by nebulization every 4 (four)  hours as needed (for wheezing/shortness of breath).     Marland Kitchen levothyroxine (SYNTHROID, LEVOTHROID) 137 MCG tablet Take 137 mcg by mouth daily.  1  . methotrexate 2.5 MG tablet Take 10 mg by mouth every Friday.     . morphine (MSIR) 15 MG tablet Take 15 mg by mouth 2 (two) times daily.     . Omega-3 Fatty Acids (FISH OIL) 1000 MG CAPS Take 1,000 mg by mouth 3 (three) times daily.    Marland Kitchen omeprazole (PRILOSEC) 40 MG capsule Take 40 mg by mouth daily.    . Potassium 99 MG TABS Take 99 mg by mouth 2 (two) times daily as needed (for cramps).    . ranitidine (ZANTAC) 300 MG tablet Take 300 mg by mouth at bedtime.    . sucralfate (CARAFATE) 1 GM/10ML suspension Take 10 mLs (1 g total) by mouth 4 (four) times daily. 420 mL 1  . tetrahydrozoline-zinc (VISINE-AC) 0.05-0.25 % ophthalmic solution Place 1-2 drops into both eyes 3 (three) times daily as needed (for runny eyes.).    Marland Kitchen tiZANidine (ZANAFLEX) 2 MG tablet Take 2 mg by mouth 2 (two) times daily.   0  . traZODone (DESYREL) 50 MG tablet Take 75 mg by mouth at bedtime.     . triamcinolone cream (KENALOG) 0.1 % Apply 1 application topically 2 (two)  times daily as needed (for skin irritation.).     No current facility-administered medications for this visit.     Physical Exam: Vitals:   11/26/17 0859  BP: 121/74  Pulse: 84  SpO2: 92%  Weight: 124 lb (56.2 kg)  Height: 5\' 2"  (1.575 m)    GEN- The patient is well appearing, alert and oriented x 3 today.   Head- normocephalic, atraumatic Eyes-  Sclera clear, conjunctiva pink Ears- hearing intact Oropharynx- clear Lungs- Clear to ausculation bilaterally, normal work of breathing Chest- pacemaker pocket is well healed Heart- Regular rate and rhythm, no murmurs, rubs or gallops, PMI not laterally displaced GI- soft, NT, ND, + BS Extremities- no clubbing, cyanosis, or edema  Pacemaker interrogation- reviewed in detail today,  See PACEART report   Assessment and Plan:  1. Symptomatic sinus  bradycardia  Normal pacemaker function See Pace Art report No changes today There is minor lead noise on her RV lead.  Sensing, impedance, and thresholds are otherwise normal.  She does not V pace (1.6%).  We will therefore follow conservatively.  2. Tobacco Remains quit since 2014!  Merlin Return to see me in a year Follow-up with Dr Domenic Polite as scheduled.  Thompson Grayer MD, [facility] 11/26/2017 9:20 AM

## 2017-12-13 DIAGNOSIS — I10 Essential (primary) hypertension: Secondary | ICD-10-CM | POA: Diagnosis not present

## 2017-12-13 DIAGNOSIS — J441 Chronic obstructive pulmonary disease with (acute) exacerbation: Secondary | ICD-10-CM | POA: Diagnosis not present

## 2017-12-13 DIAGNOSIS — M545 Low back pain: Secondary | ICD-10-CM | POA: Diagnosis not present

## 2017-12-29 DIAGNOSIS — M545 Low back pain: Secondary | ICD-10-CM | POA: Diagnosis not present

## 2017-12-29 DIAGNOSIS — Z6822 Body mass index (BMI) 22.0-22.9, adult: Secondary | ICD-10-CM | POA: Diagnosis not present

## 2017-12-29 DIAGNOSIS — E038 Other specified hypothyroidism: Secondary | ICD-10-CM | POA: Diagnosis not present

## 2017-12-29 DIAGNOSIS — G459 Transient cerebral ischemic attack, unspecified: Secondary | ICD-10-CM | POA: Diagnosis not present

## 2018-01-03 DIAGNOSIS — M545 Low back pain: Secondary | ICD-10-CM | POA: Diagnosis not present

## 2018-01-03 DIAGNOSIS — E038 Other specified hypothyroidism: Secondary | ICD-10-CM | POA: Diagnosis not present

## 2018-01-03 DIAGNOSIS — G459 Transient cerebral ischemic attack, unspecified: Secondary | ICD-10-CM | POA: Diagnosis not present

## 2018-01-03 DIAGNOSIS — Z6822 Body mass index (BMI) 22.0-22.9, adult: Secondary | ICD-10-CM | POA: Diagnosis not present

## 2018-01-20 ENCOUNTER — Ambulatory Visit: Payer: PPO | Admitting: Cardiology

## 2018-01-26 ENCOUNTER — Ambulatory Visit (INDEPENDENT_AMBULATORY_CARE_PROVIDER_SITE_OTHER): Payer: PPO | Admitting: *Deleted

## 2018-01-26 DIAGNOSIS — I495 Sick sinus syndrome: Secondary | ICD-10-CM | POA: Diagnosis not present

## 2018-01-28 ENCOUNTER — Encounter: Payer: Self-pay | Admitting: Cardiology

## 2018-01-28 DIAGNOSIS — J441 Chronic obstructive pulmonary disease with (acute) exacerbation: Secondary | ICD-10-CM | POA: Diagnosis not present

## 2018-01-28 DIAGNOSIS — M545 Low back pain: Secondary | ICD-10-CM | POA: Diagnosis not present

## 2018-01-28 DIAGNOSIS — E038 Other specified hypothyroidism: Secondary | ICD-10-CM | POA: Diagnosis not present

## 2018-01-28 NOTE — Progress Notes (Signed)
Remote pacemaker transmission.   

## 2018-02-23 LAB — CUP PACEART REMOTE DEVICE CHECK
Brady Statistic AP VP Percent: 1.7 %
Brady Statistic RV Percent Paced: 1.8 %
Date Time Interrogation Session: 20190703131023
Implantable Lead Implant Date: 20100908
Implantable Lead Model: 1944
Implantable Lead Model: 1948
Implantable Pulse Generator Implant Date: 20100908
Lead Channel Pacing Threshold Pulse Width: 0.4 ms
Lead Channel Sensing Intrinsic Amplitude: 7.3 mV
Lead Channel Setting Pacing Amplitude: 0.875
Lead Channel Setting Pacing Amplitude: 1.5 V
MDC IDC LEAD IMPLANT DT: 20100908
MDC IDC LEAD LOCATION: 753859
MDC IDC LEAD LOCATION: 753860
MDC IDC MSMT BATTERY REMAINING LONGEVITY: 47 mo
MDC IDC MSMT BATTERY REMAINING PERCENTAGE: 35 %
MDC IDC MSMT BATTERY VOLTAGE: 2.83 V
MDC IDC MSMT LEADCHNL RA IMPEDANCE VALUE: 400 Ohm
MDC IDC MSMT LEADCHNL RA PACING THRESHOLD AMPLITUDE: 0.5 V
MDC IDC MSMT LEADCHNL RA PACING THRESHOLD PULSEWIDTH: 0.5 ms
MDC IDC MSMT LEADCHNL RA SENSING INTR AMPL: 3.5 mV
MDC IDC MSMT LEADCHNL RV IMPEDANCE VALUE: 480 Ohm
MDC IDC MSMT LEADCHNL RV PACING THRESHOLD AMPLITUDE: 0.625 V
MDC IDC PG SERIAL: 2300232
MDC IDC SET LEADCHNL RV PACING PULSEWIDTH: 0.4 ms
MDC IDC SET LEADCHNL RV SENSING SENSITIVITY: 3 mV
MDC IDC STAT BRADY AP VS PERCENT: 57 %
MDC IDC STAT BRADY AS VP PERCENT: 1 %
MDC IDC STAT BRADY AS VS PERCENT: 41 %
MDC IDC STAT BRADY RA PERCENT PACED: 59 %
Pulse Gen Model: 2110

## 2018-02-28 DIAGNOSIS — G459 Transient cerebral ischemic attack, unspecified: Secondary | ICD-10-CM | POA: Diagnosis not present

## 2018-02-28 DIAGNOSIS — Z6823 Body mass index (BMI) 23.0-23.9, adult: Secondary | ICD-10-CM | POA: Diagnosis not present

## 2018-02-28 DIAGNOSIS — E038 Other specified hypothyroidism: Secondary | ICD-10-CM | POA: Diagnosis not present

## 2018-02-28 DIAGNOSIS — M545 Low back pain: Secondary | ICD-10-CM | POA: Diagnosis not present

## 2018-03-01 DIAGNOSIS — J441 Chronic obstructive pulmonary disease with (acute) exacerbation: Secondary | ICD-10-CM | POA: Diagnosis not present

## 2018-03-01 DIAGNOSIS — E038 Other specified hypothyroidism: Secondary | ICD-10-CM | POA: Diagnosis not present

## 2018-03-01 DIAGNOSIS — I10 Essential (primary) hypertension: Secondary | ICD-10-CM | POA: Diagnosis not present

## 2018-03-01 DIAGNOSIS — M545 Low back pain: Secondary | ICD-10-CM | POA: Diagnosis not present

## 2018-03-15 NOTE — Progress Notes (Signed)
Cardiology Office Note  Date: 03/16/2018   ID: Kataya, Guimont 04-09-1946, MRN [id number]  PCP: Neale Burly, MD  Primary Cardiologist: Rozann Lesches, MD   Chief Complaint  Patient presents with  . Cardiac follow-up    History of Present Illness: [name] is a 72 y.o. female last seen in May 2018.  She is here for a routine follow-up visit.  She reports no exertional chest pain or progressive palpitations, has had no syncope.  She has chronic dyspnea on exertion related to COPD and continues to follow with Dr. Sherrie Sport.  She follows with Dr. Rayann Heman in the device clinic, [place] pacemaker in place.  I personally reviewed her ECG today which shows sinus rhythm.  Past Medical History:  Diagnosis Date  . Chronic pain   . COPD with emphysema ([place])   . Depression   . GERD (gastroesophageal reflux disease)   . Hypertension   . Mitral and aortic regurgitation    Mild  . Neurodermatitis   . Peptic ulcer   . Sick sinus syndrome (HCC)    s/p PPM (SJM)  . Syncope     Past Surgical History:  Procedure Laterality Date  . APPENDECTOMY    . CARPAL TUNNEL RELEASE    . CHOLECYSTECTOMY    . ESOPHAGEAL DILATION N/A 01/15/2017   Procedure: ESOPHAGEAL DILATION;  Surgeon: Rogene Houston, MD;  [facility];  Service: Endoscopy;  Laterality: N/A;  . ESOPHAGOGASTRODUODENOSCOPY (EGD) WITH PROPOFOL N/A 01/15/2017   Procedure: ESOPHAGOGASTRODUODENOSCOPY (EGD) WITH PROPOFOL;  Surgeon: Rogene Houston, MD;  [facility];  Service: Endoscopy;  Laterality: N/A;  9:15  . NISSEN FUNDOPLICATION    . PACEMAKER INSERTION     St Jude  . SHOULDER SURGERY    . SPLENECTOMY      Current Outpatient Medications  Medication Sig Dispense Refill  . ALPRAZolam (XANAX) 1 MG tablet Take 1 mg by mouth 2 (two) times daily as needed.     . Cholecalciferol (VITAMIN D3) 1000 units CAPS Take 1 capsule by mouth daily.  0  . folic acid (FOLVITE) 1 MG tablet Take 1 mg by mouth  daily.  1  . gabapentin (NEURONTIN) 400 MG capsule Take 400 mg by mouth 3 (three) times daily.    Marland Kitchen ipratropium (ATROVENT) 0.02 % nebulizer solution Take 0.25 mg by nebulization every 4 (four) hours as needed (for wheezing/shortness of breath).     Marland Kitchen levothyroxine (SYNTHROID, LEVOTHROID) 137 MCG tablet Take 137 mcg by mouth daily.  1  . methotrexate 2.5 MG tablet Take 2.5 mg by mouth every Friday. Takes 4 tabs every Friday    . morphine (MSIR) 15 MG tablet Take 15 mg by mouth 2 (two) times daily.     . Omega-3 Fatty Acids (FISH OIL) 1000 MG CAPS Take 1,000 mg by mouth daily.     Marland Kitchen omeprazole (PRILOSEC) 40 MG capsule Take 40 mg by mouth daily.    . Potassium 99 MG TABS Take 99 mg by mouth 2 (two) times daily as needed (for cramps).    . ranitidine (ZANTAC) 300 MG tablet Take 300 mg by mouth at bedtime.    . sucralfate (CARAFATE) 1 GM/10ML suspension Take 10 mLs (1 g total) by mouth 4 (four) times daily. 420 mL 1  . tetrahydrozoline-zinc (VISINE-AC) 0.05-0.25 % ophthalmic solution Place 1-2 drops into both eyes 3 (three) times daily as needed (for runny eyes.).    Marland Kitchen traZODone (DESYREL) 50 MG tablet  Take 25 mg by mouth at bedtime.     . triamcinolone cream (KENALOG) 0.1 % Apply 1 application topically 2 (two) times daily as needed (for skin irritation.).     No current facility-administered medications for this visit.    Allergies:  Patient has no known allergies.   Social History: The patient  reports that she quit smoking about 5 years ago. Her smoking use included cigarettes. She started smoking about 54 years ago. She has a 80.00 pack-year smoking history. She has never used smokeless tobacco. She reports that she does not drink alcohol or use drugs.   ROS:  Please see the history of present illness. Otherwise, complete review of systems is positive for hearing loss.  All other systems are reviewed and negative.   Physical Exam: VS:  BP 111/63   Pulse 68   Ht 5\' 2"  (1.575 m)   Wt 125 lb  (56.7 kg)   SpO2 93%   BMI 22.86 kg/m , BMI Body mass index is 22.86 kg/m.  Wt Readings from Last 3 Encounters:  03/16/18 125 lb (56.7 kg)  11/26/17 124 lb (56.2 kg)  02/24/17 128 lb 4.8 oz (58.2 kg)    General: Patient appears comfortable at rest. HEENT: Conjunctiva and lids normal, oropharynx clear. Neck: Supple, no elevated JVP or carotid bruits, no thyromegaly. Lungs: Decreased breath sounds without wheezing, nonlabored breathing at rest. Cardiac: Regular rate and rhythm, no S3, 2/6 systolic murmur. Abdomen: Soft, nontender, bowel sounds present. Extremities: No pitting edema, distal pulses 2+.  ECG: I personally reviewed the tracing from 11/24/2016 which showed sinus rhythm with nonspecific ST changes.  Recent Labwork:  June 2018: Potassium 4.3, BUN 14, creatinine 1.03, hemoglobin 9.7, platelets 312  Other Studies Reviewed Today:  Echocardiogram 11/07/2015: Study Conclusions  - Left ventricle: The cavity size was normal. Wall thickness was   normal. Systolic function was normal. The estimated ejection   fraction was in the range of 55% to 60%. Wall motion was normal;   there were no regional wall motion abnormalities. Doppler   parameters are consistent with abnormal left ventricular   relaxation (grade 1 diastolic dysfunction). - Aortic valve: Mildly calcified annulus. Trileaflet; mildly   calcified leaflets. There was trivial regurgitation. - Mitral valve: Calcified annulus. There was trivial regurgitation. - Right ventricle: Pacer wire or catheter noted in right ventricle. - Right atrium: Central venous pressure (est): 3 mm Hg. - Tricuspid valve: There was mild regurgitation. - Pulmonary arteries: PA peak pressure: 30 mm Hg (S). - Pericardium, extracardiac: A prominent pericardial fat pad was   present.  Impressions:  - Normal LV wall thickness with LVEF 55-60%. Grade 1 diastolic   dysfunction with normal estimated LV filling pressure. Trivial   mitral  regurgitation. Mildly sclerotic aortic valve with trivial   aortic regurgitation. Mild tricuspid regurgitation with PASP 30   mmHg. Device wire present in right heart.  Assessment and Plan:  1.  Sick sinus syndrome status post St. Jude pacemaker.  She follows with Dr. Rayann Heman in the device clinic.  ECG shows sinus rhythm today.  2.  Heart murmur with mild valvular heart disease by last echocardiogram in 2017, asymptomatic.  Continue with observation for now.  Current medicines were reviewed with the patient today.   Orders Placed This Encounter  Procedures  . EKG 12-Lead    Disposition: Follow-up in 1 year.  Signed, Satira Sark, MD, [facility] 03/16/2018 3:37 PM    [facility]  [place], [place], [place] [address] Phone: [phone]; Fax: [phone]

## 2018-03-16 ENCOUNTER — Ambulatory Visit: Payer: PPO | Admitting: Cardiology

## 2018-03-16 ENCOUNTER — Encounter: Payer: Self-pay | Admitting: Cardiology

## 2018-03-16 VITALS — BP 111/63 | HR 68 | Ht 62.0 in | Wt 125.0 lb

## 2018-03-16 DIAGNOSIS — I38 Endocarditis, valve unspecified: Secondary | ICD-10-CM | POA: Diagnosis not present

## 2018-03-16 DIAGNOSIS — I495 Sick sinus syndrome: Secondary | ICD-10-CM

## 2018-03-16 NOTE — Patient Instructions (Signed)

## 2018-03-18 ENCOUNTER — Ambulatory Visit: Payer: PPO | Admitting: Cardiology

## 2018-04-28 ENCOUNTER — Ambulatory Visit (INDEPENDENT_AMBULATORY_CARE_PROVIDER_SITE_OTHER): Payer: PPO | Admitting: *Deleted

## 2018-04-28 DIAGNOSIS — Z0000 Encounter for general adult medical examination without abnormal findings: Secondary | ICD-10-CM | POA: Diagnosis not present

## 2018-04-28 DIAGNOSIS — I10 Essential (primary) hypertension: Secondary | ICD-10-CM

## 2018-04-28 DIAGNOSIS — Z6822 Body mass index (BMI) 22.0-22.9, adult: Secondary | ICD-10-CM | POA: Diagnosis not present

## 2018-04-28 DIAGNOSIS — I495 Sick sinus syndrome: Secondary | ICD-10-CM

## 2018-04-28 DIAGNOSIS — F411 Generalized anxiety disorder: Secondary | ICD-10-CM | POA: Diagnosis not present

## 2018-04-28 DIAGNOSIS — M545 Low back pain: Secondary | ICD-10-CM | POA: Diagnosis not present

## 2018-04-28 NOTE — Progress Notes (Signed)
Remote pacemaker transmission.   

## 2018-06-07 LAB — CUP PACEART REMOTE DEVICE CHECK
Battery Remaining Percentage: 25 %
Brady Statistic AP VP Percent: 1.7 %
Brady Statistic AP VS Percent: 65 %
Brady Statistic RV Percent Paced: 1.7 %
Date Time Interrogation Session: 20191002134644
Implantable Lead Implant Date: 20100908
Implantable Lead Model: 1944
Lead Channel Impedance Value: 550 Ohm
Lead Channel Pacing Threshold Amplitude: 0.5 V
Lead Channel Sensing Intrinsic Amplitude: 3.4 mV
Lead Channel Sensing Intrinsic Amplitude: 9.6 mV
Lead Channel Setting Pacing Amplitude: 1.5 V
Lead Channel Setting Pacing Pulse Width: 0.4 ms
Lead Channel Setting Sensing Sensitivity: 3 mV
MDC IDC LEAD IMPLANT DT: 20100908
MDC IDC LEAD LOCATION: 753859
MDC IDC LEAD LOCATION: 753860
MDC IDC MSMT BATTERY REMAINING LONGEVITY: 35 mo
MDC IDC MSMT BATTERY VOLTAGE: 2.8 V
MDC IDC MSMT LEADCHNL RA IMPEDANCE VALUE: 450 Ohm
MDC IDC MSMT LEADCHNL RA PACING THRESHOLD AMPLITUDE: 0.5 V
MDC IDC MSMT LEADCHNL RA PACING THRESHOLD PULSEWIDTH: 0.5 ms
MDC IDC MSMT LEADCHNL RV PACING THRESHOLD PULSEWIDTH: 0.4 ms
MDC IDC PG IMPLANT DT: 20100908
MDC IDC PG SERIAL: 2300232
MDC IDC SET LEADCHNL RV PACING AMPLITUDE: 0.75 V
MDC IDC STAT BRADY AS VP PERCENT: 1 %
MDC IDC STAT BRADY AS VS PERCENT: 34 %
MDC IDC STAT BRADY RA PERCENT PACED: 66 %

## 2018-06-08 DIAGNOSIS — J449 Chronic obstructive pulmonary disease, unspecified: Secondary | ICD-10-CM | POA: Diagnosis not present

## 2018-06-08 DIAGNOSIS — I10 Essential (primary) hypertension: Secondary | ICD-10-CM | POA: Diagnosis not present

## 2018-06-08 DIAGNOSIS — M545 Low back pain: Secondary | ICD-10-CM | POA: Diagnosis not present

## 2018-06-30 DIAGNOSIS — Z0000 Encounter for general adult medical examination without abnormal findings: Secondary | ICD-10-CM | POA: Diagnosis not present

## 2018-06-30 DIAGNOSIS — J449 Chronic obstructive pulmonary disease, unspecified: Secondary | ICD-10-CM | POA: Diagnosis not present

## 2018-06-30 DIAGNOSIS — Z1389 Encounter for screening for other disorder: Secondary | ICD-10-CM | POA: Diagnosis not present

## 2018-06-30 DIAGNOSIS — M545 Low back pain: Secondary | ICD-10-CM | POA: Diagnosis not present

## 2018-06-30 DIAGNOSIS — Z6821 Body mass index (BMI) 21.0-21.9, adult: Secondary | ICD-10-CM | POA: Diagnosis not present

## 2018-06-30 DIAGNOSIS — E038 Other specified hypothyroidism: Secondary | ICD-10-CM | POA: Diagnosis not present

## 2018-07-05 IMAGING — RF DG ESOPHAGUS
9 series · 14 of 24 positions shown · non-contrast
Comparison: None

CLINICAL DATA: Esophageal dysphagia, epigastric and chest pain for
1 month, remote EGD, odynophagia, history COPD, GERD, hypertension,
former smoker

EXAM:
ESOPHOGRAM / BARIUM SWALLOW / BARIUM TABLET STUDY
TECHNIQUE: Combined double contrast and single contrast examination performed
using effervescent crystals, thick barium liquid, and thin barium
liquid. The patient was observed with fluoroscopy swallowing a 13 mm
barium sulphate tablet.
FLUOROSCOPY TIME:  Fluoroscopy Time:  2 minutes 12 seconds
Radiation Exposure Index (if provided by the fluoroscopic device):
27.9 mGy
Number of Acquired Spot Images: multiple fluoroscopic screen
captures

[Series 1: cp_standard · 0.18mm/px · 2 of 81 frames shown (1 of 9)]
[frame 2/81]
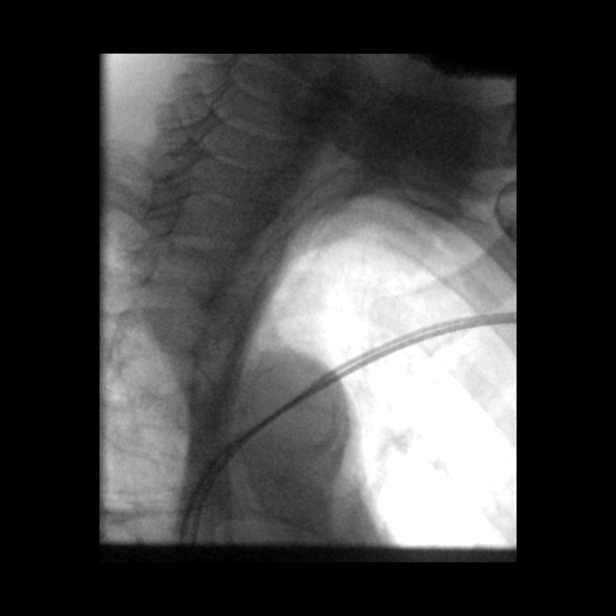
[frame 69/81]
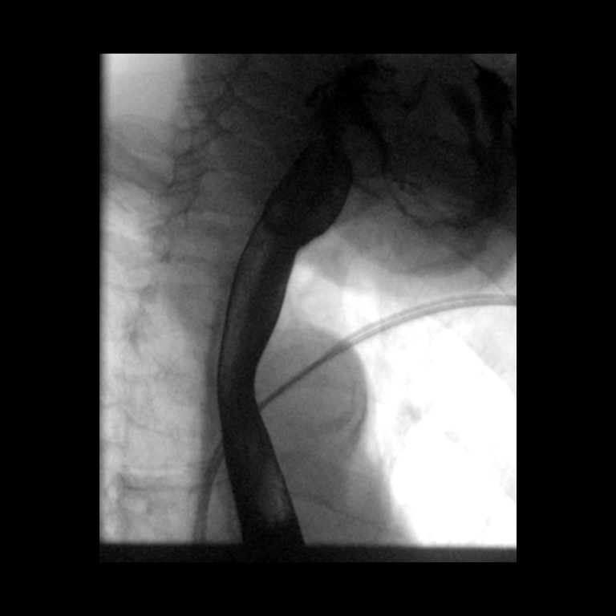

[Series 2: cp_standard · 0.19mm/px · 1 of 12 frames shown (2 of 9)]
[frame 7/12]
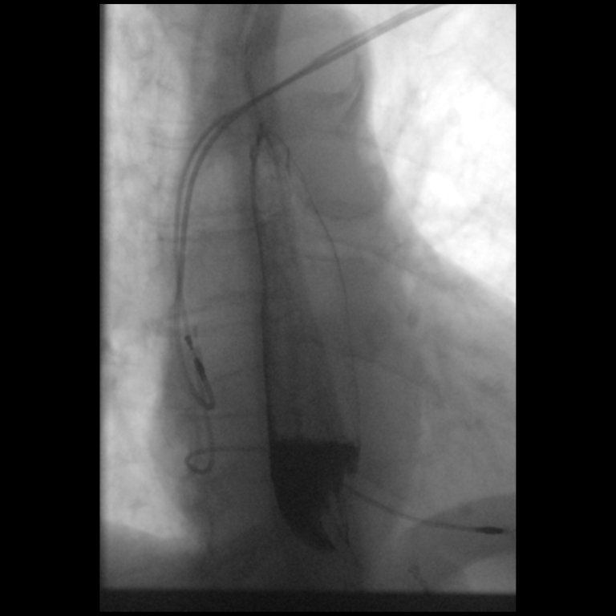

[Series 3: cp_standard · 0.19mm/px · 2 of 155 frames shown (3 of 9)]
[frame 78/155]
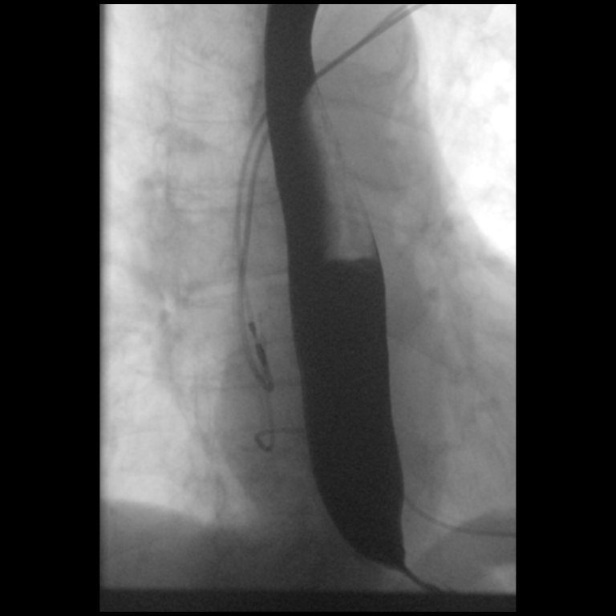
[frame 139/155]
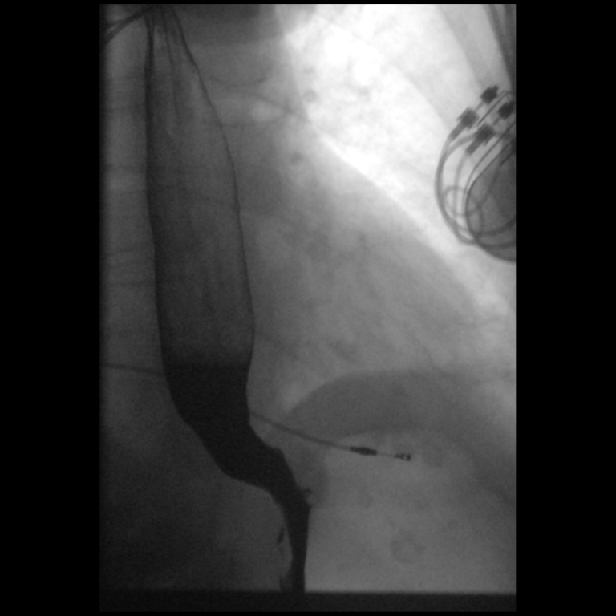

[Series 4: cp_standard · 0.19mm/px · 1 of 140 frames shown (4 of 9)]
[frame 88/140]
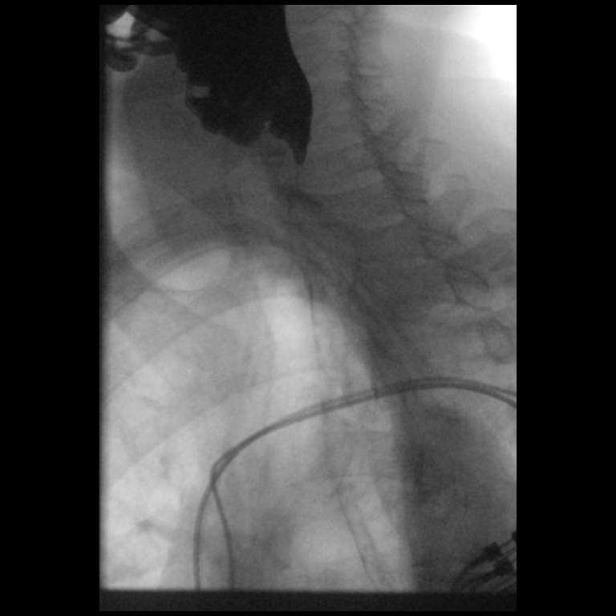

[Series 5: cp_standard · 0.19mm/px · 2 of 46 frames shown (5 of 9)]
[frame 7/46]
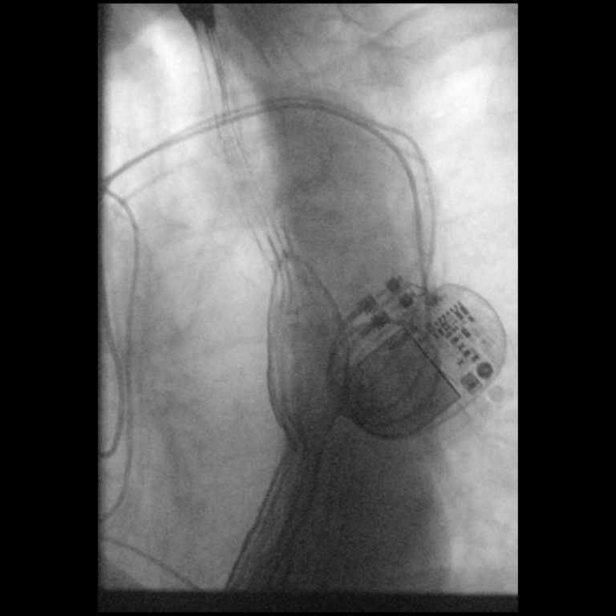
[frame 24/46]
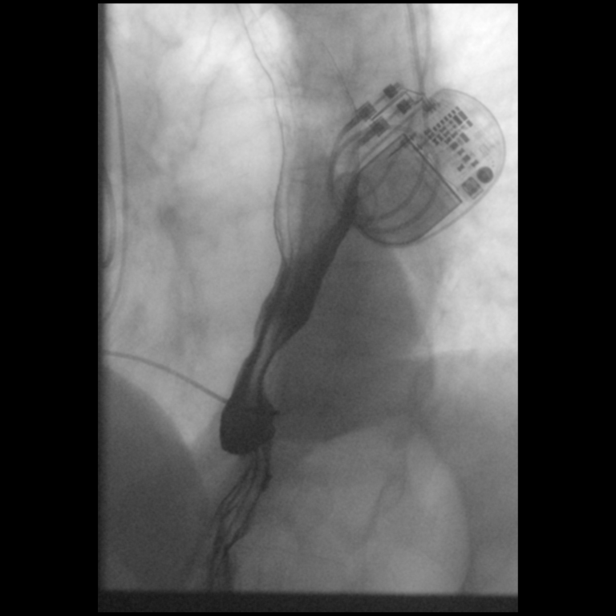

[Series 6: cp_standard · 0.19mm/px · 1 of 92 frames shown (6 of 9)]
[frame 47/92]
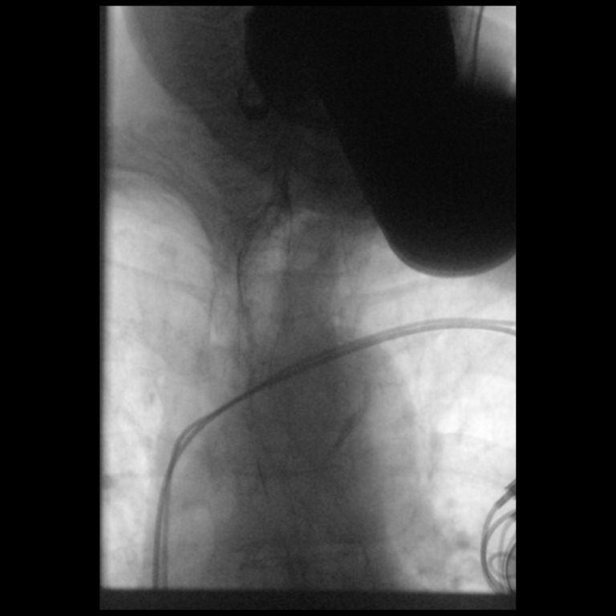

[Series 7: cp_standard · 0.18mm/px · 2 of 115 frames shown (7 of 9)]
[frame 18/115]
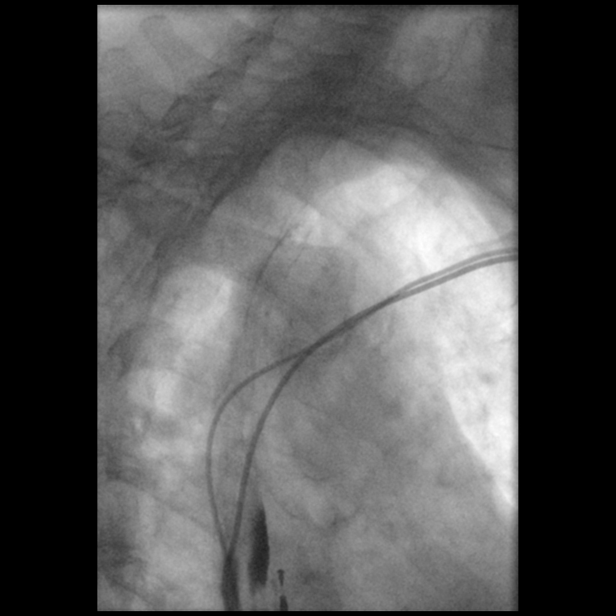
[frame 98/115]
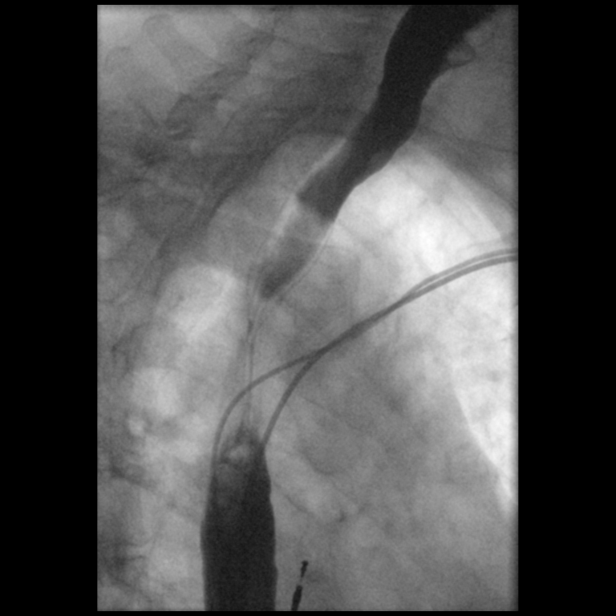

[Series 8: cp_standard · 0.18mm/px · 1 of 75 frames shown (8 of 9)]
[frame 16/75]
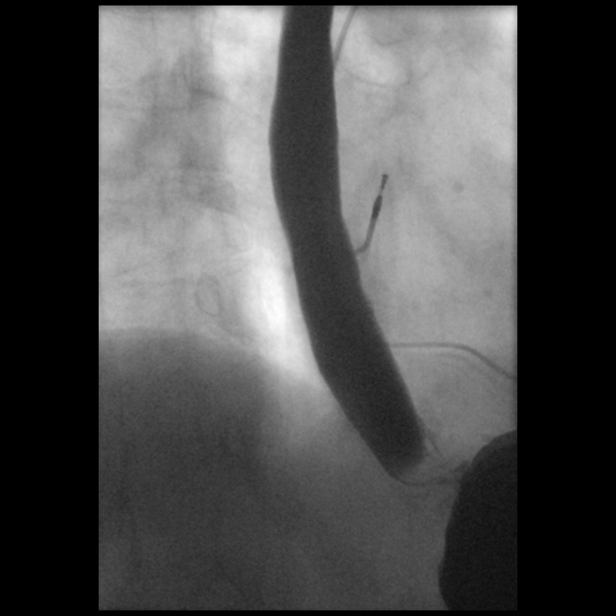

[Series 9: cp_standard · 0.18mm/px · 2 of 126 frames shown (9 of 9)]
[frame 19/126]
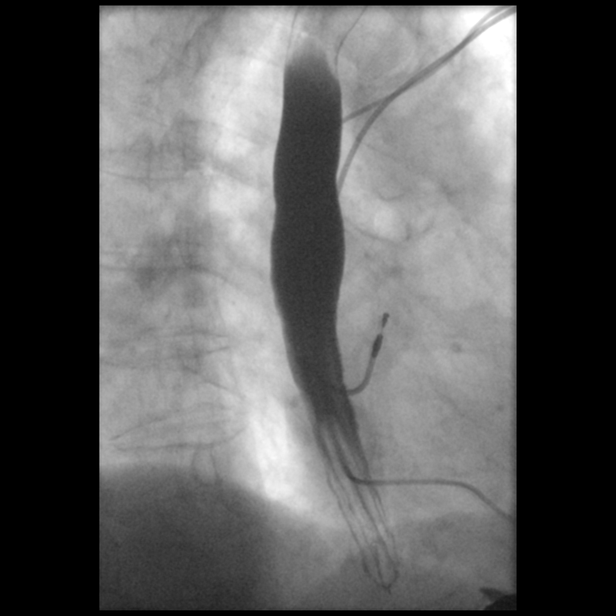
[frame 125/126]
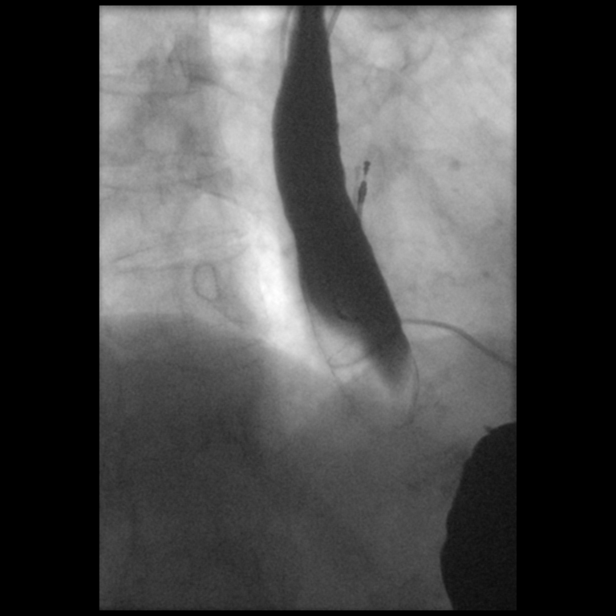

[14 of 24 positions shown; findings below may reference images not displayed]

FINDINGS: Esophageal distention: Normal without mass or stricture

Filling defects:  None

12.5 mm barium tablet: Passed from oral cavity to stomach without
obstruction

Motility: Mild to moderate age-related esophageal dysmotility with
incomplete clearance of barium by primary peristaltic waves.
Scattered secondary waves. Clearance facilitated by repeat
swallowing and upright positioning.

Mucosa: Smooth without irregularity or ulceration. Patient
complained of pain with each swallow but no definite mucosal changes
were seen to suggest esophagitis.

Hypopharynx/cervical esophagus: Normal oropharyngeal motion without
laryngeal penetration or aspiration at real-time imaging; the
lateral swallow cine series did not save.

Hiatal hernia:  Absent

GE reflux:  Not visualized during exam

Other: Atherosclerotic calcification aorta. Pacemaker leads via LEFT
subclavian approach.
IMPRESSION: Age-related esophageal dysmotility.

Otherwise negative exam.

Aortic atherosclerosis.

## 2018-07-28 ENCOUNTER — Ambulatory Visit (INDEPENDENT_AMBULATORY_CARE_PROVIDER_SITE_OTHER): Payer: PPO

## 2018-07-28 DIAGNOSIS — I495 Sick sinus syndrome: Secondary | ICD-10-CM | POA: Diagnosis not present

## 2018-07-28 LAB — CUP PACEART REMOTE DEVICE CHECK
Battery Remaining Longevity: 35 mo
Battery Voltage: 2.8 V
Brady Statistic AP VP Percent: 1.4 %
Brady Statistic AS VS Percent: 35 %
Date Time Interrogation Session: 20200102142031
Implantable Lead Implant Date: 20100908
Implantable Lead Implant Date: 20100908
Implantable Lead Location: 753859
Implantable Lead Model: 1944
Lead Channel Impedance Value: 530 Ohm
Lead Channel Pacing Threshold Amplitude: 0.5 V
Lead Channel Pacing Threshold Pulse Width: 0.4 ms
Lead Channel Setting Pacing Amplitude: 0.75 V
Lead Channel Setting Pacing Amplitude: 1.5 V
Lead Channel Setting Pacing Pulse Width: 0.4 ms
MDC IDC LEAD LOCATION: 753860
MDC IDC MSMT BATTERY REMAINING PERCENTAGE: 25 %
MDC IDC MSMT LEADCHNL RA IMPEDANCE VALUE: 480 Ohm
MDC IDC MSMT LEADCHNL RA PACING THRESHOLD PULSEWIDTH: 0.5 ms
MDC IDC MSMT LEADCHNL RA SENSING INTR AMPL: 3.8 mV
MDC IDC MSMT LEADCHNL RV PACING THRESHOLD AMPLITUDE: 0.5 V
MDC IDC MSMT LEADCHNL RV SENSING INTR AMPL: 9.9 mV
MDC IDC PG IMPLANT DT: 20100908
MDC IDC SET LEADCHNL RV SENSING SENSITIVITY: 3 mV
MDC IDC STAT BRADY AP VS PERCENT: 64 %
MDC IDC STAT BRADY AS VP PERCENT: 1 %
MDC IDC STAT BRADY RA PERCENT PACED: 65 %
MDC IDC STAT BRADY RV PERCENT PACED: 1.4 %
Pulse Gen Model: 2110
Pulse Gen Serial Number: 2300232

## 2018-07-28 NOTE — Progress Notes (Signed)
Remote pacemaker transmission.   

## 2018-07-29 DIAGNOSIS — H26492 Other secondary cataract, left eye: Secondary | ICD-10-CM | POA: Diagnosis not present

## 2018-08-31 DIAGNOSIS — Z6821 Body mass index (BMI) 21.0-21.9, adult: Secondary | ICD-10-CM | POA: Diagnosis not present

## 2018-08-31 DIAGNOSIS — M545 Low back pain: Secondary | ICD-10-CM | POA: Diagnosis not present

## 2018-08-31 DIAGNOSIS — E038 Other specified hypothyroidism: Secondary | ICD-10-CM | POA: Diagnosis not present

## 2018-08-31 DIAGNOSIS — J449 Chronic obstructive pulmonary disease, unspecified: Secondary | ICD-10-CM | POA: Diagnosis not present

## 2018-09-26 DIAGNOSIS — M545 Low back pain: Secondary | ICD-10-CM | POA: Diagnosis not present

## 2018-09-26 DIAGNOSIS — E038 Other specified hypothyroidism: Secondary | ICD-10-CM | POA: Diagnosis not present

## 2018-09-26 DIAGNOSIS — J449 Chronic obstructive pulmonary disease, unspecified: Secondary | ICD-10-CM | POA: Diagnosis not present

## 2018-10-27 ENCOUNTER — Ambulatory Visit (INDEPENDENT_AMBULATORY_CARE_PROVIDER_SITE_OTHER): Payer: PPO | Admitting: *Deleted

## 2018-10-27 ENCOUNTER — Other Ambulatory Visit: Payer: Self-pay

## 2018-10-27 DIAGNOSIS — E119 Type 2 diabetes mellitus without complications: Secondary | ICD-10-CM | POA: Diagnosis not present

## 2018-10-27 DIAGNOSIS — Z01812 Encounter for preprocedural laboratory examination: Secondary | ICD-10-CM | POA: Diagnosis not present

## 2018-10-27 DIAGNOSIS — I495 Sick sinus syndrome: Secondary | ICD-10-CM | POA: Diagnosis not present

## 2018-10-27 DIAGNOSIS — D638 Anemia in other chronic diseases classified elsewhere: Secondary | ICD-10-CM | POA: Diagnosis not present

## 2018-10-27 DIAGNOSIS — Z20828 Contact with and (suspected) exposure to other viral communicable diseases: Secondary | ICD-10-CM | POA: Diagnosis not present

## 2018-10-27 LAB — CUP PACEART REMOTE DEVICE CHECK
Battery Remaining Longevity: 28 mo
Battery Remaining Percentage: 19 %
Battery Voltage: 2.77 V
Brady Statistic AP VP Percent: 1.1 %
Brady Statistic AP VS Percent: 62 %
Brady Statistic AS VP Percent: 1 %
Brady Statistic AS VS Percent: 36 %
Brady Statistic RA Percent Paced: 63 %
Brady Statistic RV Percent Paced: 1.2 %
Date Time Interrogation Session: 20200402133739
Implantable Lead Implant Date: 20100908
Implantable Lead Implant Date: 20100908
Implantable Lead Location: 753859
Implantable Lead Location: 753860
Implantable Lead Model: 1944
Implantable Lead Model: 1948
Implantable Pulse Generator Implant Date: 20100908
Lead Channel Impedance Value: 480 Ohm
Lead Channel Impedance Value: 540 Ohm
Lead Channel Pacing Threshold Amplitude: 0.5 V
Lead Channel Pacing Threshold Amplitude: 0.625 V
Lead Channel Pacing Threshold Pulse Width: 0.4 ms
Lead Channel Pacing Threshold Pulse Width: 0.5 ms
Lead Channel Sensing Intrinsic Amplitude: 3.4 mV
Lead Channel Sensing Intrinsic Amplitude: 9.9 mV
Lead Channel Setting Pacing Amplitude: 0.875
Lead Channel Setting Pacing Amplitude: 1.5 V
Lead Channel Setting Pacing Pulse Width: 0.4 ms
Lead Channel Setting Sensing Sensitivity: 3 mV
Pulse Gen Model: 2110
Pulse Gen Serial Number: 2300232

## 2018-10-31 DIAGNOSIS — J449 Chronic obstructive pulmonary disease, unspecified: Secondary | ICD-10-CM | POA: Diagnosis not present

## 2018-10-31 DIAGNOSIS — C61 Malignant neoplasm of prostate: Secondary | ICD-10-CM | POA: Diagnosis not present

## 2018-10-31 DIAGNOSIS — E038 Other specified hypothyroidism: Secondary | ICD-10-CM | POA: Diagnosis not present

## 2018-10-31 DIAGNOSIS — R918 Other nonspecific abnormal finding of lung field: Secondary | ICD-10-CM | POA: Diagnosis not present

## 2018-10-31 DIAGNOSIS — Z6821 Body mass index (BMI) 21.0-21.9, adult: Secondary | ICD-10-CM | POA: Diagnosis not present

## 2018-10-31 DIAGNOSIS — K5730 Diverticulosis of large intestine without perforation or abscess without bleeding: Secondary | ICD-10-CM | POA: Diagnosis not present

## 2018-10-31 DIAGNOSIS — F064 Anxiety disorder due to known physiological condition: Secondary | ICD-10-CM | POA: Diagnosis not present

## 2018-10-31 DIAGNOSIS — M545 Low back pain: Secondary | ICD-10-CM | POA: Diagnosis not present

## 2018-11-04 ENCOUNTER — Encounter: Payer: Self-pay | Admitting: Cardiology

## 2018-11-04 NOTE — Progress Notes (Signed)
Remote pacemaker transmission.   

## 2018-11-14 DIAGNOSIS — M545 Low back pain: Secondary | ICD-10-CM | POA: Diagnosis not present

## 2018-11-14 DIAGNOSIS — J449 Chronic obstructive pulmonary disease, unspecified: Secondary | ICD-10-CM | POA: Diagnosis not present

## 2018-11-14 DIAGNOSIS — E038 Other specified hypothyroidism: Secondary | ICD-10-CM | POA: Diagnosis not present

## 2018-11-25 ENCOUNTER — Encounter: Payer: Self-pay | Admitting: Internal Medicine

## 2018-11-25 ENCOUNTER — Other Ambulatory Visit: Payer: Self-pay

## 2018-11-25 ENCOUNTER — Ambulatory Visit (INDEPENDENT_AMBULATORY_CARE_PROVIDER_SITE_OTHER): Payer: PPO | Admitting: Internal Medicine

## 2018-11-25 ENCOUNTER — Telehealth: Payer: Self-pay | Admitting: Internal Medicine

## 2018-11-25 ENCOUNTER — Telehealth: Payer: Self-pay

## 2018-11-25 VITALS — BP 126/55 | HR 64 | Ht 62.0 in | Wt 121.6 lb

## 2018-11-25 DIAGNOSIS — I739 Peripheral vascular disease, unspecified: Secondary | ICD-10-CM

## 2018-11-25 DIAGNOSIS — I495 Sick sinus syndrome: Secondary | ICD-10-CM

## 2018-11-25 DIAGNOSIS — Z720 Tobacco use: Secondary | ICD-10-CM | POA: Diagnosis not present

## 2018-11-25 NOTE — Telephone Encounter (Signed)
Please call her back on mobile number. Patient is not at home.

## 2018-11-25 NOTE — Addendum Note (Signed)
Addended by: Laurine Blazer on: 11/25/2018 03:40 PM   Modules accepted: Orders

## 2018-11-25 NOTE — Telephone Encounter (Signed)
Spoke with pt regarding appt on 11/25/18. Pt stated she will reach out to son to help log in to MyChart. Pt was advise to check vitals prior to appt and call me back if she has any questions. Pt concerns were address.

## 2018-11-25 NOTE — Progress Notes (Signed)
PCP: Neale Burly, MD Primary Cardiologist: Dr Domenic Polite Primary EP: Dr Rayann Heman  [name] is a 73 y.o. female who presents today for routine electrophysiology followup.  Since last being seen in our clinic, the patient reports doing very well.  Today, she denies symptoms of palpitations, chest pain, shortness of breath,  lower extremity edema, dizziness, presyncope, or syncope.  She does have pain in her calves when walking. The patient is otherwise without complaint today.   Past Medical History:  Diagnosis Date  . Chronic pain   . COPD with emphysema ([place])   . Depression   . GERD (gastroesophageal reflux disease)   . Hypertension   . Mitral and aortic regurgitation    Mild  . Neurodermatitis   . Peptic ulcer   . Sick sinus syndrome (HCC)    s/p PPM (SJM)  . Syncope    Past Surgical History:  Procedure Laterality Date  . APPENDECTOMY    . CARPAL TUNNEL RELEASE    . CHOLECYSTECTOMY    . ESOPHAGEAL DILATION N/A 01/15/2017   Procedure: ESOPHAGEAL DILATION;  Surgeon: Rogene Houston, MD;  [facility];  Service: Endoscopy;  Laterality: N/A;  . ESOPHAGOGASTRODUODENOSCOPY (EGD) WITH PROPOFOL N/A 01/15/2017   Procedure: ESOPHAGOGASTRODUODENOSCOPY (EGD) WITH PROPOFOL;  Surgeon: Rogene Houston, MD;  [facility];  Service: Endoscopy;  Laterality: N/A;  9:15  . NISSEN FUNDOPLICATION    . PACEMAKER INSERTION     St Jude  . SHOULDER SURGERY    . SPLENECTOMY      ROS- all systems are reviewed and negatives except as per HPI above  Current Outpatient Medications  Medication Sig Dispense Refill  . ALPRAZolam (XANAX) 1 MG tablet Take 1 mg by mouth 2 (two) times daily as needed.     . Cholecalciferol (VITAMIN D3) 1000 units CAPS Take 1 capsule by mouth daily.  0  . folic acid (FOLVITE) 1 MG tablet Take 1 mg by mouth daily.  1  . gabapentin (NEURONTIN) 400 MG capsule Take 400 mg by mouth 3 (three) times daily.    Marland Kitchen ipratropium (ATROVENT) 0.02 % nebulizer  solution Take 0.25 mg by nebulization every 4 (four) hours as needed (for wheezing/shortness of breath).     Marland Kitchen levothyroxine (SYNTHROID, LEVOTHROID) 137 MCG tablet Take 137 mcg by mouth daily.  1  . methotrexate 2.5 MG tablet Take 2.5 mg by mouth every Friday. Takes 4 tabs every Friday    . Omega-3 Fatty Acids (FISH OIL) 1000 MG CAPS Take 1,000 mg by mouth daily.     Marland Kitchen omeprazole (PRILOSEC) 40 MG capsule Take 40 mg by mouth daily.    . ranitidine (ZANTAC) 300 MG tablet Take 300 mg by mouth at bedtime.    . sucralfate (CARAFATE) 1 GM/10ML suspension Take 10 mLs (1 g total) by mouth 4 (four) times daily. 420 mL 1  . tetrahydrozoline-zinc (VISINE-AC) 0.05-0.25 % ophthalmic solution Place 1-2 drops into both eyes 3 (three) times daily as needed (for runny eyes.).    Marland Kitchen traZODone (DESYREL) 50 MG tablet Take 25 mg by mouth at bedtime.     . triamcinolone cream (KENALOG) 0.1 % Apply 1 application topically 2 (two) times daily as needed (for skin irritation.).     No current facility-administered medications for this visit.     Physical Exam: Vitals:   11/25/18 1505  BP: (!) 126/55  Pulse: 64  SpO2: 96%  Weight: 121 lb 9.6 oz (55.2 kg)  Height: 5'  2" (1.575 m)    GEN- The patient is well appearing, alert and oriented x 3 today.   Head- normocephalic, atraumatic Eyes-  Sclera clear, conjunctiva pink Ears- hearing intact Oropharynx- clear Lungs- Clear to ausculation bilaterally, normal work of breathing Heart- Regular rate and rhythm, no murmurs, rubs or gallops, PMI not laterally displaced GI- soft, NT, ND, + BS Extremities- no clubbing, cyanosis, or edema 2+ DP/PT pulses  Wt Readings from Last 3 Encounters:  11/25/18 121 lb 9.6 oz (55.2 kg)  03/16/18 125 lb (56.7 kg)  11/26/17 124 lb (56.2 kg)     Assessment and Plan:  1. Sick sinus syndrome Normal pacemaker function (performed by me today). Rare and short noise reversions are noted.  Will continue to follow See Pace Art report  No changes today she is not device dependant today  2. Tobacco  remaisn quite since 2014!  3. Calf pain Suggestive of claudication given history of tobacco Will check ABIs, arterial dopplers to evaluate for PAD  Merlin Return to see me in a year  Thompson Grayer MD, [facility] 11/25/2018 3:09 PM

## 2018-11-25 NOTE — Patient Instructions (Addendum)
Medication Instructions:  Continue all current medications.  Labwork: none  Testing/Procedures:  Your physician has requested that you have an ankle brachial index (ABI). During this test an ultrasound and blood pressure cuff are used to evaluate the arteries that supply the arms and legs with blood. Allow thirty minutes for this exam. There are no restrictions or special instructions.  Office will contact with results via phone or letter.    Follow-Up: Your physician wants you to follow up in:  1 year.  You will receive a reminder letter in the mail one-two months in advance.  If you don't receive a letter, please call our office to schedule the follow up appointment - Dr. Rayann Heman.    Any Other Special Instructions Will Be Listed Below (If Applicable).  If you need a refill on your cardiac medications before your next appointment, please call your pharmacy.

## 2018-11-25 NOTE — Telephone Encounter (Signed)
Patient actually came to office - was seen by Dr. Rayann Heman.

## 2018-11-25 NOTE — Telephone Encounter (Signed)
Patient is having issues getting onto MyChart for her appt with Dr Rayann Heman. She cannot confirm the appt.

## 2018-12-07 LAB — CUP PACEART INCLINIC DEVICE CHECK
Date Time Interrogation Session: 20200513090951
Implantable Lead Implant Date: 20100908
Implantable Lead Implant Date: 20100908
Implantable Lead Location: 753859
Implantable Lead Location: 753860
Implantable Lead Model: 1944
Implantable Lead Model: 1948
Implantable Pulse Generator Implant Date: 20100908
Pulse Gen Model: 2110
Pulse Gen Serial Number: 2300232

## 2018-12-29 DIAGNOSIS — E038 Other specified hypothyroidism: Secondary | ICD-10-CM | POA: Diagnosis not present

## 2018-12-29 DIAGNOSIS — M545 Low back pain: Secondary | ICD-10-CM | POA: Diagnosis not present

## 2018-12-29 DIAGNOSIS — F064 Anxiety disorder due to known physiological condition: Secondary | ICD-10-CM | POA: Diagnosis not present

## 2018-12-29 DIAGNOSIS — Z6822 Body mass index (BMI) 22.0-22.9, adult: Secondary | ICD-10-CM | POA: Diagnosis not present

## 2018-12-29 DIAGNOSIS — J449 Chronic obstructive pulmonary disease, unspecified: Secondary | ICD-10-CM | POA: Diagnosis not present

## 2019-01-26 ENCOUNTER — Ambulatory Visit (INDEPENDENT_AMBULATORY_CARE_PROVIDER_SITE_OTHER): Payer: PPO | Admitting: *Deleted

## 2019-01-26 DIAGNOSIS — I495 Sick sinus syndrome: Secondary | ICD-10-CM | POA: Diagnosis not present

## 2019-01-26 LAB — CUP PACEART REMOTE DEVICE CHECK
Battery Remaining Longevity: 24 mo
Battery Remaining Percentage: 17 %
Battery Voltage: 2.75 V
Brady Statistic AP VP Percent: 1.1 %
Brady Statistic AP VS Percent: 63 %
Brady Statistic AS VP Percent: 1 %
Brady Statistic AS VS Percent: 36 %
Brady Statistic RA Percent Paced: 64 %
Brady Statistic RV Percent Paced: 1.1 %
Date Time Interrogation Session: 20200702112755
Implantable Lead Implant Date: 20100908
Implantable Lead Implant Date: 20100908
Implantable Lead Location: 753859
Implantable Lead Location: 753860
Implantable Lead Model: 1944
Implantable Lead Model: 1948
Implantable Pulse Generator Implant Date: 20100908
Lead Channel Impedance Value: 450 Ohm
Lead Channel Impedance Value: 530 Ohm
Lead Channel Pacing Threshold Amplitude: 0.5 V
Lead Channel Pacing Threshold Amplitude: 0.5 V
Lead Channel Pacing Threshold Pulse Width: 0.4 ms
Lead Channel Pacing Threshold Pulse Width: 0.5 ms
Lead Channel Sensing Intrinsic Amplitude: 3.5 mV
Lead Channel Sensing Intrinsic Amplitude: 9.5 mV
Lead Channel Setting Pacing Amplitude: 0.75 V
Lead Channel Setting Pacing Amplitude: 1.5 V
Lead Channel Setting Pacing Pulse Width: 0.4 ms
Lead Channel Setting Sensing Sensitivity: 3 mV
Pulse Gen Model: 2110
Pulse Gen Serial Number: 2300232

## 2019-02-03 ENCOUNTER — Encounter: Payer: Self-pay | Admitting: Cardiology

## 2019-02-03 NOTE — Progress Notes (Signed)
Remote pacemaker transmission.   

## 2019-02-08 ENCOUNTER — Other Ambulatory Visit: Payer: Self-pay | Admitting: Internal Medicine

## 2019-02-08 DIAGNOSIS — I739 Peripheral vascular disease, unspecified: Secondary | ICD-10-CM

## 2019-02-21 ENCOUNTER — Ambulatory Visit (INDEPENDENT_AMBULATORY_CARE_PROVIDER_SITE_OTHER): Payer: PPO | Admitting: Internal Medicine

## 2019-02-21 ENCOUNTER — Encounter (INDEPENDENT_AMBULATORY_CARE_PROVIDER_SITE_OTHER): Payer: Self-pay | Admitting: Internal Medicine

## 2019-02-21 ENCOUNTER — Other Ambulatory Visit (INDEPENDENT_AMBULATORY_CARE_PROVIDER_SITE_OTHER): Payer: Self-pay | Admitting: Internal Medicine

## 2019-02-21 ENCOUNTER — Other Ambulatory Visit: Payer: Self-pay

## 2019-02-21 VITALS — BP 135/79 | HR 76 | Temp 98.2°F | Ht 62.0 in | Wt 130.1 lb

## 2019-02-21 DIAGNOSIS — K625 Hemorrhage of anus and rectum: Secondary | ICD-10-CM

## 2019-02-21 NOTE — Patient Instructions (Signed)
The risks of bleeding, perforation and infection were reviewed with patient.  

## 2019-02-21 NOTE — Progress Notes (Addendum)
   Subjective:    Patient ID: [name], female    DOB: Dec 07, 1945, 73 y.o.   MRN: [id number] PCP Dr. Sherrie Sport HPI Presents today with c/o rectal bleeding. Saw Dr. Sherrie Sport  In June and per hx had been having rectal bleeding for 3 weeks.  She tells me today she is still having some rectal bleeding. She describes as a small amt. Bleeding is dark in color. Denies prior hx of rectal bleeding.  She does have some epigastric tenderness. She takes ?PPI for her GERD. Appetite is okay. No weight loss. Has a BM x 3 a day. Stools are loose which is not normal for her.  Her last colonoscopy was in 2016 by Dr. Anthony Sar for rectal bleeding. Normal colonoscopy.      She was last seen in August of 2018 for dysphagia. Non bleeding esophageal ulcers involving proximal third. Normal mid and distal esophagus.  A 366 fundoplication found. Wrap appeared tight.  Non bleeding gastric ulcers with no stigmata of bleeding. Normal duodenal bulb. Duodenal deformity. Normal 2nd portion of the duodenum.   Patient has a Engineer, [employer].      Objective:   Physical Exam Blood pressure 135/79, pulse 76, temperature 98.2 F (36.8 C), height 5\' 2"  (1.575 m), weight 130 lb 1.6 oz (59 kg). Alert and oriented. Skin warm and dry. Oral mucosa is moist.   . Sclera anicteric, conjunctivae is pink. Thyroid not enlarged. No cervical lymphadenopathy. Lungs clear. Heart regular rate and rhythm.  Abdomen is soft. Bowel sounds are positive. No hepatomegaly. No abdominal masses felt. No tenderness.  No edema to lower extremities. No stool, guaiac negative. No masses felt.         Assessment & Plan:  Rectal bleeding. Colonic neoplasm, AVM, ulcer, polyp needs to be ruled out. Colonoscopy with propofol. The risks of bleeding, perforation and infection were reviewed with patient.

## 2019-02-23 ENCOUNTER — Encounter (INDEPENDENT_AMBULATORY_CARE_PROVIDER_SITE_OTHER): Payer: Self-pay | Admitting: *Deleted

## 2019-02-23 ENCOUNTER — Telehealth (INDEPENDENT_AMBULATORY_CARE_PROVIDER_SITE_OTHER): Payer: Self-pay | Admitting: *Deleted

## 2019-02-23 DIAGNOSIS — K625 Hemorrhage of anus and rectum: Secondary | ICD-10-CM | POA: Insufficient documentation

## 2019-02-23 MED ORDER — PEG 3350-KCL-NA BICARB-NACL 420 G PO SOLR: 4000.0000 mL | Freq: Once | ORAL | 0 refills | Status: AC

## 2019-02-23 NOTE — Telephone Encounter (Signed)
Patient needs trilyte 

## 2019-02-24 ENCOUNTER — Other Ambulatory Visit: Payer: Self-pay

## 2019-02-28 ENCOUNTER — Encounter (HOSPITAL_COMMUNITY): Payer: Self-pay

## 2019-02-28 DIAGNOSIS — J449 Chronic obstructive pulmonary disease, unspecified: Secondary | ICD-10-CM | POA: Diagnosis not present

## 2019-02-28 DIAGNOSIS — E038 Other specified hypothyroidism: Secondary | ICD-10-CM | POA: Diagnosis not present

## 2019-02-28 DIAGNOSIS — F064 Anxiety disorder due to known physiological condition: Secondary | ICD-10-CM | POA: Diagnosis not present

## 2019-02-28 DIAGNOSIS — M545 Low back pain: Secondary | ICD-10-CM | POA: Diagnosis not present

## 2019-02-28 DIAGNOSIS — Z6823 Body mass index (BMI) 23.0-23.9, adult: Secondary | ICD-10-CM | POA: Diagnosis not present

## 2019-03-01 ENCOUNTER — Encounter (HOSPITAL_COMMUNITY)
Admission: RE | Admit: 2019-03-01 | Discharge: 2019-03-01 | Disposition: A | Discharge status: Home / Self Care | Payer: PPO | Source: Ambulatory Visit | Attending: Internal Medicine | Admitting: Internal Medicine

## 2019-03-01 ENCOUNTER — Other Ambulatory Visit (HOSPITAL_COMMUNITY)
Admission: RE | Admit: 2019-03-01 | Discharge: 2019-03-01 | Disposition: A | Discharge status: Home / Self Care | Payer: PPO | Source: Ambulatory Visit | Attending: Internal Medicine | Admitting: Internal Medicine

## 2019-03-01 ENCOUNTER — Other Ambulatory Visit: Payer: Self-pay

## 2019-03-01 DIAGNOSIS — Z01812 Encounter for preprocedural laboratory examination: Secondary | ICD-10-CM | POA: Diagnosis not present

## 2019-03-01 DIAGNOSIS — Z20828 Contact with and (suspected) exposure to other viral communicable diseases: Secondary | ICD-10-CM | POA: Insufficient documentation

## 2019-03-01 LAB — SARS CORONAVIRUS 2 (TAT 6-24 HRS): SARS Coronavirus 2: NEGATIVE

## 2019-03-03 ENCOUNTER — Encounter (HOSPITAL_COMMUNITY)
Admission: RE | Disposition: A | Discharge status: Home / Self Care | Payer: Self-pay | Source: Home / Self Care | Attending: Internal Medicine

## 2019-03-03 ENCOUNTER — Ambulatory Visit (HOSPITAL_COMMUNITY)
Admission: RE | Admit: 2019-03-03 | Discharge: 2019-03-03 | Disposition: A | Discharge status: Home / Self Care | Payer: PPO | Attending: Internal Medicine | Admitting: Internal Medicine

## 2019-03-03 ENCOUNTER — Encounter (HOSPITAL_COMMUNITY): Payer: Self-pay | Admitting: *Deleted

## 2019-03-03 ENCOUNTER — Ambulatory Visit (HOSPITAL_COMMUNITY): Payer: PPO | Admitting: Anesthesiology

## 2019-03-03 DIAGNOSIS — K219 Gastro-esophageal reflux disease without esophagitis: Secondary | ICD-10-CM | POA: Diagnosis not present

## 2019-03-03 DIAGNOSIS — Z79899 Other long term (current) drug therapy: Secondary | ICD-10-CM | POA: Diagnosis not present

## 2019-03-03 DIAGNOSIS — G8929 Other chronic pain: Secondary | ICD-10-CM | POA: Diagnosis not present

## 2019-03-03 DIAGNOSIS — F329 Major depressive disorder, single episode, unspecified: Secondary | ICD-10-CM | POA: Insufficient documentation

## 2019-03-03 DIAGNOSIS — L280 Lichen simplex chronicus: Secondary | ICD-10-CM | POA: Insufficient documentation

## 2019-03-03 DIAGNOSIS — I10 Essential (primary) hypertension: Secondary | ICD-10-CM | POA: Diagnosis not present

## 2019-03-03 DIAGNOSIS — K644 Residual hemorrhoidal skin tags: Secondary | ICD-10-CM

## 2019-03-03 DIAGNOSIS — I495 Sick sinus syndrome: Secondary | ICD-10-CM | POA: Diagnosis not present

## 2019-03-03 DIAGNOSIS — I080 Rheumatic disorders of both mitral and aortic valves: Secondary | ICD-10-CM | POA: Insufficient documentation

## 2019-03-03 DIAGNOSIS — J439 Emphysema, unspecified: Secondary | ICD-10-CM | POA: Diagnosis not present

## 2019-03-03 DIAGNOSIS — E039 Hypothyroidism, unspecified: Secondary | ICD-10-CM | POA: Insufficient documentation

## 2019-03-03 DIAGNOSIS — Z950 Presence of cardiac pacemaker: Secondary | ICD-10-CM | POA: Diagnosis not present

## 2019-03-03 DIAGNOSIS — Z87891 Personal history of nicotine dependence: Secondary | ICD-10-CM | POA: Diagnosis not present

## 2019-03-03 DIAGNOSIS — Z8711 Personal history of peptic ulcer disease: Secondary | ICD-10-CM | POA: Diagnosis not present

## 2019-03-03 DIAGNOSIS — K5730 Diverticulosis of large intestine without perforation or abscess without bleeding: Secondary | ICD-10-CM

## 2019-03-03 DIAGNOSIS — Z7951 Long term (current) use of inhaled steroids: Secondary | ICD-10-CM | POA: Diagnosis not present

## 2019-03-03 DIAGNOSIS — Z9049 Acquired absence of other specified parts of digestive tract: Secondary | ICD-10-CM | POA: Insufficient documentation

## 2019-03-03 DIAGNOSIS — K625 Hemorrhage of anus and rectum: Secondary | ICD-10-CM | POA: Insufficient documentation

## 2019-03-03 HISTORY — PX: COLONOSCOPY WITH PROPOFOL: SHX5780

## 2019-03-03 LAB — CBC
HCT: 30.3 % — ABNORMAL LOW (ref 36.0–46.0)
Hemoglobin: 9.8 g/dL — ABNORMAL LOW (ref 12.0–15.0)
MCH: 33.4 pg (ref 26.0–34.0)
MCHC: 32.3 g/dL (ref 30.0–36.0)
MCV: 103.4 fL — ABNORMAL HIGH (ref 80.0–100.0)
Platelets: 293 10*3/uL (ref 150–400)
RBC: 2.93 MIL/uL — ABNORMAL LOW (ref 3.87–5.11)
RDW: 14.6 % (ref 11.5–15.5)
WBC: 8 10*3/uL (ref 4.0–10.5)
nRBC: 0 % (ref 0.0–0.2)

## 2019-03-03 SURGERY — COLONOSCOPY WITH PROPOFOL
Anesthesia: General

## 2019-03-03 MED ORDER — PROPOFOL 10 MG/ML IV BOLUS
INTRAVENOUS | Status: AC
  Filled 2019-03-03: qty 40

## 2019-03-03 MED ORDER — PROPOFOL 10 MG/ML IV BOLUS
INTRAVENOUS | Status: DC | PRN
  Administered 2019-03-03: 60 mg via INTRAVENOUS
  Administered 2019-03-03: 10 mg via INTRAVENOUS
  Administered 2019-03-03: 20 mg via INTRAVENOUS

## 2019-03-03 MED ORDER — PROPOFOL 500 MG/50ML IV EMUL
INTRAVENOUS | Status: DC | PRN
  Administered 2019-03-03: 100 ug/kg/min via INTRAVENOUS
  Administered 2019-03-03: 200 ug/kg/min via INTRAVENOUS

## 2019-03-03 MED ORDER — CHLORHEXIDINE GLUCONATE CLOTH 2 % EX PADS: 6.0000 | MEDICATED_PAD | Freq: Once | CUTANEOUS | Status: DC

## 2019-03-03 MED ORDER — LACTATED RINGERS IV SOLN
INTRAVENOUS | Status: DC | PRN
  Administered 2019-03-03: 14:00:00 via INTRAVENOUS

## 2019-03-03 MED ORDER — PROPOFOL 10 MG/ML IV BOLUS
INTRAVENOUS | Status: AC
  Filled 2019-03-03: qty 20

## 2019-03-03 MED ORDER — LACTATED RINGERS IV SOLN
Freq: Once | INTRAVENOUS | Status: AC
  Administered 2019-03-03: 1000 mL via INTRAVENOUS

## 2019-03-03 MED ORDER — STERILE WATER FOR IRRIGATION IR SOLN
Status: DC | PRN
  Administered 2019-03-03: 2.5 mL

## 2019-03-03 MED ORDER — HYDROCORTISONE (PERIANAL) 2.5 % EX CREA: 1.0000 "application " | TOPICAL_CREAM | Freq: Two times a day (BID) | CUTANEOUS | 1 refills | Status: DC

## 2019-03-03 NOTE — Discharge Instructions (Signed)
High-Fiber Diet Fiber, also called dietary fiber, is a type of carbohydrate that is found in fruits, vegetables, whole grains, and beans. A high-fiber diet can have many health benefits. Your health care provider may recommend a high-fiber diet to help:  Prevent constipation. Fiber can make your bowel movements more regular.  Lower your cholesterol.  Relieve the following conditions: ? Swelling of veins in the anus (hemorrhoids). ? Swelling and irritation (inflammation) of specific areas of the digestive tract (uncomplicated diverticulosis). ? A problem of the large intestine (colon) that sometimes causes pain and diarrhea (irritable bowel syndrome, IBS).  Prevent overeating as part of a weight-loss plan.  Prevent heart disease, type 2 diabetes, and certain cancers. What is my plan? The recommended daily fiber intake in grams (g) includes:  38 g for men age 60 or younger.  30 g for men over age 55.  45 g for women age 54 or younger.  21 g for women over age 81. You can get the recommended daily intake of dietary fiber by:  Eating a variety of fruits, vegetables, grains, and beans.  Taking a fiber supplement, if it is not possible to get enough fiber through your diet. What do I need to know about a high-fiber diet?  It is better to get fiber through food sources rather than from fiber supplements. There is not a lot of research about how effective supplements are.  Always check the fiber content on the nutrition facts label of any prepackaged food. Look for foods that contain 5 g of fiber or more per serving.  Talk with a diet and nutrition specialist (dietitian) if you have questions about specific foods that are recommended or not recommended for your medical condition, especially if those foods are not listed below.  Gradually increase how much fiber you consume. If you increase your intake of dietary fiber too quickly, you may have bloating, cramping, or gas.  Drink plenty  of water. Water helps you to digest fiber. What are tips for following this plan?  Eat a wide variety of high-fiber foods.  Make sure that half of the grains that you eat each day are whole grains.  Eat breads and cereals that are made with whole-grain flour instead of refined flour or white flour.  Eat brown rice, bulgur wheat, or millet instead of white rice.  Start the day with a breakfast that is high in fiber, such as a cereal that contains 5 g of fiber or more per serving.  Use beans in place of meat in soups, salads, and pasta dishes.  Eat high-fiber snacks, such as berries, raw vegetables, nuts, and popcorn.  Choose whole fruits and vegetables instead of processed forms like juice or sauce. What foods can I eat?  Fruits Berries. Pears. Apples. Oranges. Avocado. Prunes and raisins. Dried figs. Vegetables Sweet potatoes. Spinach. Kale. Artichokes. Cabbage. Broccoli. Cauliflower. Green peas. Carrots. Squash. Grains Whole-grain breads. Multigrain cereal. Oats and oatmeal. Brown rice. Barley. Bulgur wheat. Garden. Quinoa. Bran muffins. Popcorn. Rye wafer crackers. Meats and other proteins Navy, kidney, and pinto beans. Soybeans. Split peas. Lentils. Nuts and seeds. Dairy Fiber-fortified yogurt. Beverages Fiber-fortified soy milk. Fiber-fortified orange juice. Other foods Fiber bars. The items listed above may not be a complete list of recommended foods and beverages. Contact a dietitian for more options. What foods are not recommended? Fruits Fruit juice. Cooked, strained fruit. Vegetables Fried potatoes. Canned vegetables. Well-cooked vegetables. Grains White bread. Pasta made with refined flour. White rice. Meats and other  proteins Fatty cuts of meat. Fried chicken or fried fish. Dairy Milk. Yogurt. Cream cheese. Sour cream. Fats and oils Butters. Beverages Soft drinks. Other foods Cakes and pastries. The items listed above may not be a complete list of foods  and beverages to avoid. Contact a dietitian for more information. Summary  Fiber is a type of carbohydrate. It is found in fruits, vegetables, whole grains, and beans.  There are many health benefits of eating a high-fiber diet, such as preventing constipation, lowering blood cholesterol, helping with weight loss, and reducing your risk of heart disease, diabetes, and certain cancers.  Gradually increase your intake of fiber. Increasing too fast can result in cramping, bloating, and gas. Drink plenty of water while you increase your fiber.  The best sources of fiber include whole fruits and vegetables, whole grains, nuts, seeds, and beans. This information is not intended to replace advice given to you by your health care provider. Make sure you discuss any questions you have with your health care provider. Document Released: 07/13/2005 Document Revised: 05/17/2017 Document Reviewed: 05/17/2017 Elsevier Patient Education  [place].  Colonoscopy, Adult, Care After This sheet gives you information about how to care for yourself after your procedure. Your doctor may also give you more specific instructions. If you have problems or questions, call your doctor. What can I expect after the procedure? After the procedure, it is common to have:  A small amount of blood in your poop for 24 hours.  Some gas.  Mild cramping or bloating in your belly. Follow these instructions at home: General instructions  For the first 24 hours after the procedure: ? Do not drive or use machinery. ? Do not sign important documents. ? Do not drink alcohol. ? Do your daily activities more slowly than normal. ? Eat foods that are soft and easy to digest.  Take over-the-counter or prescription medicines only as told by your doctor. To help cramping and bloating:   Try walking around.  Put heat on your belly (abdomen) as told by your doctor. Use a heat source that your doctor recommends, such as a  moist heat pack or a heating pad. ? Put a towel between your skin and the heat source. ? Leave the heat on for 20-30 minutes. ? Remove the heat if your skin turns bright red. This is especially important if you cannot feel pain, heat, or cold. You can get burned. Eating and drinking   Drink enough fluid to keep your pee (urine) clear or pale yellow.  Return to your normal diet as told by your doctor. Avoid heavy or fried foods that are hard to digest.  Avoid drinking alcohol for as long as told by your doctor. Contact a doctor if:  You have blood in your poop (stool) 2-3 days after the procedure. Get help right away if:  You have more than a small amount of blood in your poop.  You see large clumps of tissue (blood clots) in your poop.  Your belly is swollen.  You feel sick to your stomach (nauseous).  You throw up (vomit).  You have a fever.  You have belly pain that gets worse, and medicine does not help your pain. Summary  After the procedure, it is common to have a small amount of blood in your poop. You may also have mild cramping and bloating in your belly.  For the first 24 hours after the procedure, do not drive or use machinery, do not sign important  documents, and do not drink alcohol.  Get help right away if you have a lot of blood in your poop, feel sick to your stomach, have a fever, or have more belly pain. This information is not intended to replace advice given to you by your health care provider. Make sure you discuss any questions you have with your health care provider. Document Released: 08/15/2010 Document Revised: 05/13/2017 Document Reviewed: 04/06/2016 Elsevier Patient Education  2020 [place] usual medications as before Anusol HC cream to be applied to anal canal daily at bedtime for 2 weeks and thereafter on as-needed basis. High-fiber diet. No driving for 24 hours. Physician will call with results of blood test.

## 2019-03-03 NOTE — H&P (Signed)
[name] is an 73 y.o. female.   Chief Complaint: Patient is here for colonoscopy. HPI: Patient is 73 year old Caucasian female who presents with 1 month history of rectal bleeding.  She says she has been passing small amount of blood daily with bowel movements.  She says her stools are mushy.  She actually has 2 stools per day.  She also complains of soreness in left lower quadrant of her abdomen.  She denies weight loss fever chills or recent use of antibiotic.  Last colonoscopy reportedly was normal 4 years ago by Dr. Anthony Sar of [facility]. Family history is negative for IBD or CRC.  Past Medical History:  Diagnosis Date  . Chronic pain   . COPD with emphysema ([place])   . Depression   . GERD (gastroesophageal reflux disease)   . Hypertension   . Mitral and aortic regurgitation    Mild  . Neurodermatitis   . Peptic ulcer   . Sick sinus syndrome ([facility])    s/p PPM ([facility])  . Syncope     Past Surgical History:  Procedure Laterality Date  . APPENDECTOMY    . CARPAL TUNNEL RELEASE    . CHOLECYSTECTOMY    . ESOPHAGEAL DILATION N/A 01/15/2017   Procedure: ESOPHAGEAL DILATION;  Surgeon: Rogene Houston, MD;  [facility]: [facility];  Service: Endoscopy;  Laterality: N/A;  . ESOPHAGOGASTRODUODENOSCOPY (EGD) WITH PROPOFOL N/A 01/15/2017   Procedure: ESOPHAGOGASTRODUODENOSCOPY (EGD) WITH PROPOFOL;  Surgeon: Rogene Houston, MD;  Location: [facility];  Service: Endoscopy;  Laterality: N/A;  9:15  . NISSEN FUNDOPLICATION    . PACEMAKER INSERTION     [employer]  . SHOULDER SURGERY    . SPLENECTOMY      History reviewed. No pertinent family history. Social History:  reports that she quit smoking about 6 years ago. Her smoking use included cigarettes. She started smoking about 55 years ago. She has a 80.00 pack-year smoking history. She has never used smokeless tobacco. She reports that she does not drink alcohol or use drugs.  Allergies: No Known Allergies  Medications Prior to  Admission  Medication Sig Dispense Refill  . albuterol (VENTOLIN HFA) 108 (90 Base) MCG/ACT inhaler Inhale 2 puffs into the lungs every 6 (six) hours as needed for wheezing or shortness of breath.    . ALPRAZolam (XANAX) 1 MG tablet Take 1 mg by mouth 2 (two) times daily.     . Ascorbic Acid (VITAMIN C) 1000 MG tablet Take 1,000 mg by mouth daily.    . Calcium Carb-Cholecalciferol (CALCIUM 600 + D PO) Take 1 tablet by mouth daily.    . cetirizine (ZYRTEC) 10 MG tablet Take 10 mg by mouth daily.    . Cholecalciferol (VITAMIN D3) 1000 units CAPS Take 1,000 Units by mouth daily.   0  . ferrous sulfate 325 (65 FE) MG tablet Take 325 mg by mouth daily with breakfast.    . folic acid (FOLVITE) 1 MG tablet Take 1 mg by mouth daily.  1  . gabapentin (NEURONTIN) 400 MG capsule Take 400-800 mg by mouth See admin instructions. Take 400 mg in the morning and take 800 mg at night    . gentamicin (GARAMYCIN) 0.3 % ophthalmic solution Place 2 drops into both eyes at bedtime.    Marland Kitchen ipratropium-albuterol (DUONEB) 0.5-2.5 (3) MG/3ML SOLN Take 3 mLs by nebulization every 4 (four) hours as needed (shortness of breath).    Marland Kitchen levothyroxine (SYNTHROID) 100 MCG tablet Take 100 mcg by  mouth daily before breakfast.    . methotrexate 2.5 MG tablet Take 10 mg by mouth every Friday.     . Omega-3 Fatty Acids (FISH OIL) 1000 MG CAPS Take 1,000 mg by mouth daily.     Marland Kitchen omeprazole (PRILOSEC) 40 MG capsule Take 40 mg by mouth daily.    . sertraline (ZOLOFT) 50 MG tablet Take 50 mg by mouth daily.    . sucralfate (CARAFATE) 1 g tablet Take 1 g by mouth 3 (three) times daily.    . tizanidine (ZANAFLEX) 2 MG capsule Take 2 mg by mouth 2 (two) times daily.     . traZODone (DESYREL) 50 MG tablet Take 25 mg by mouth at bedtime.     . triamcinolone cream (KENALOG) 0.1 % Apply 1 application topically 2 (two) times daily as needed (for skin irritation.).    Marland Kitchen acetaminophen (TYLENOL) 500 MG tablet Take 1,000 mg by mouth every 6 (six)  hours as needed for moderate pain.      No results found for this or any previous visit (from the past 48 hour(s)). No results found.  ROS  Blood pressure 138/67, pulse 76, temperature 97.6 F (36.4 C), temperature source Oral, resp. rate 18, SpO2 96 %. Physical Exam  Constitutional: She appears well-developed and well-nourished.  HENT:  Mouth/Throat: Oropharynx is clear and moist.  Eyes: Conjunctivae are normal. No scleral icterus.  Neck: No thyromegaly present.  Cardiovascular: Normal rate and regular rhythm.  Murmur heard. Grade 2/6 systolic murmur best heard at aortic area.  Respiratory: Effort normal and breath sounds normal.  GI:  Abdomen is symmetrical with upper midline scar.  On palpation abdomen is soft with mild tenderness at LLQ.  No organomegaly or masses.  Lymphadenopathy:    She has no cervical adenopathy.     Assessment/Plan Rectal bleeding. Diagnostic colonoscopy.  Hildred Laser, MD 03/03/2019, 1:27 PM

## 2019-03-03 NOTE — Anesthesia Procedure Notes (Signed)
Date/Time: 03/03/2019 1:49 PM Performed by: Vista Deck, CRNA Pre-anesthesia Checklist: Patient identified, Emergency Drugs available, Suction available, Timeout performed and Patient being monitored Patient Re-evaluated:Patient Re-evaluated prior to induction Oxygen Delivery Method: Nasal Cannula

## 2019-03-03 NOTE — Anesthesia Preprocedure Evaluation (Signed)
Anesthesia Evaluation  Patient identified by MRN, date of birth, ID band Patient awake    Reviewed: Allergy & Precautions, NPO status , Patient's Chart, lab work & pertinent test results  Airway Mallampati: II  TM Distance: >3 FB Neck ROM: Full    Dental  (+) Upper Dentures, Edentulous Lower   Pulmonary shortness of breath, with exertion and Long-Term Oxygen Therapy, COPD,  COPD inhaler and oxygen dependent, former smoker,    Pulmonary exam normal breath sounds clear to auscultation       Cardiovascular Exercise Tolerance: Poor hypertension, Normal cardiovascular exam+ pacemaker  Rhythm:Regular Rate:Normal     Neuro/Psych PSYCHIATRIC DISORDERS Depression    GI/Hepatic PUD, GERD  ,  Endo/Other  Hypothyroidism   Renal/GU      Musculoskeletal   Abdominal   Peds  Hematology   Anesthesia Other Findings   Reproductive/Obstetrics                             Anesthesia Physical Anesthesia Plan  ASA: IV  Anesthesia Plan: General   Post-op Pain Management:    Induction: Intravenous  PONV Risk Score and Plan:   Airway Management Planned: Nasal Cannula, Simple Face Mask and Natural Airway  Additional Equipment:   Intra-op Plan:   Post-operative Plan:   Informed Consent: I have reviewed the patients History and Physical, chart, labs and discussed the procedure including the risks, benefits and alternatives for the proposed anesthesia with the patient or authorized representative who has indicated his/her understanding and acceptance.     Dental advisory given  Plan Discussed with: CRNA  Anesthesia Plan Comments:         Anesthesia Quick Evaluation

## 2019-03-03 NOTE — Anesthesia Postprocedure Evaluation (Signed)
Anesthesia Post Note  Patient: [name]  Procedure(s) Performed: COLONOSCOPY WITH PROPOFOL (N/A )  Patient location during evaluation: Short Stay Anesthesia Type: General Level of consciousness: awake, awake and alert and patient cooperative Pain management: satisfactory to patient Vital Signs Assessment: post-procedure vital signs reviewed and stable Respiratory status: spontaneous breathing Cardiovascular status: stable Postop Assessment: no apparent nausea or vomiting Anesthetic complications: no     Last Vitals:  Vitals:   03/03/19 1252 03/03/19 1430  BP: 138/67   Pulse: 76 73  Resp: 18 (!) 25  Temp: 36.4 C 36.6 C  SpO2: 96% 96%    Last Pain:  Vitals:   03/03/19 1430  TempSrc:   PainSc: 0-No pain                 Daryana Whirley

## 2019-03-03 NOTE — Op Note (Signed)
[facility] Patient Name: [name] Procedure Date: 03/03/2019 1:03 PM MRN: [id number] Date of Birth: Feb 23, 1946 Attending MD: Hildred Laser , MD CSN: [id number] Age: 73 Admit Type: Outpatient Procedure:                Colonoscopy Indications:              Rectal bleeding Providers:                Hildred Laser, MD, Lurline Del, RN, Raphael Gibney,                            Technician Referring MD:             Stoney Bang, MD Medicines:                Propofol per Anesthesia Complications:            No immediate complications. Estimated Blood Loss:     Estimated blood loss: none. Procedure:                Pre-Anesthesia Assessment:                           - Prior to the procedure, a History and Physical                            was performed, and patient medications and                            allergies were reviewed. The patient's tolerance of                            previous anesthesia was also reviewed. The risks                            and benefits of the procedure and the sedation                            options and risks were discussed with the patient.                            All questions were answered, and informed consent                            was obtained. Prior Anticoagulants: The patient has                            taken no previous anticoagulant or antiplatelet                            agents. ASA Grade Assessment: IV - A patient with                            severe systemic disease that is a constant threat  to life. After reviewing the risks and benefits,                            the patient was deemed in satisfactory condition to                            undergo the procedure.                           After obtaining informed consent, the colonoscope                            was passed under direct vision. Throughout the                            procedure, the patient's blood pressure, pulse,  and                            oxygen saturations were monitored continuously. The                            [id number]-[id number] ([id number]) scope was introduced through                            the anus and advanced to the the cecum, identified                            by appendiceal orifice and ileocecal valve. The                            colonoscopy was somewhat difficult due to a                            tortuous colon. Successful completion of the                            procedure was aided by changing the patient to a                            supine position. Scope In: 1:56:28 PM Scope Out: 2:21:08 PM Scope Withdrawal Time: 0 hours 4 minutes 51 seconds  Total Procedure Duration: 0 hours 24 minutes 40 seconds  Findings:      Skin tags were found on perianal exam.      Scattered diverticula were found in the sigmoid colon, descending colon       and transverse colon.      External hemorrhoids were found during retroflexion. The hemorrhoids       were small. Impression:               - Perianal skin tags found on perianal exam.                           - Diverticulosis in the sigmoid colon, in the  descending colon and in the transverse colon.                           - External hemorrhoids.                           - No specimens collected. Moderate Sedation:      Per Anesthesia Care Recommendation:           - Patient has a contact number available for                            emergencies. The signs and symptoms of potential                            delayed complications were discussed with the                            patient. Return to normal activities tomorrow.                            Written discharge instructions were provided to the                            patient.                           - High fiber diet today.                           - Continue present medications.                           - Anusol-HC cream per rectum  qhs for 2weeks.                           - Check CBC today.                           - No recommendation at this time regarding repeat                            colonoscopy due to age and no polyps on today's                            exam. Procedure Code(s):        --- Professional ---                           [phone], Colonoscopy, flexible; diagnostic, including                            collection of specimen(s) by brushing or washing,                            when performed (separate procedure) Diagnosis Code(s):        --- Professional ---  K64.4, Residual hemorrhoidal skin tags                           K62.5, Hemorrhage of anus and rectum                           K57.30, Diverticulosis of large intestine without                            perforation or abscess without bleeding CPT copyright 2019 American [employer]. All rights reserved. The codes documented in this report are preliminary and upon coder review may  be revised to meet current compliance requirements. Hildred Laser, MD Hildred Laser, MD 03/03/2019 2:32:48 PM This report has been signed electronically. Number of Addenda: 0

## 2019-03-03 NOTE — Transfer of Care (Signed)
Immediate Anesthesia Transfer of Care Note  Patient: [name]  Procedure(s) Performed: COLONOSCOPY WITH PROPOFOL (N/A )  Patient Location: PACU  Anesthesia Type:General  Level of Consciousness: awake, alert , oriented and patient cooperative  Airway & Oxygen Therapy: Patient Spontanous Breathing  Post-op Assessment: Report given to RN and Post -op Vital signs reviewed and stable  Post vital signs: Reviewed and stable  Last Vitals:  Vitals Value Taken Time  BP 124/59   Temp    Pulse 73 03/03/19 1430  Resp 25 03/03/19 1430  SpO2 96 % 03/03/19 1430  Vitals shown include unvalidated device data.  Last Pain:  Vitals:   03/03/19 1252  TempSrc: Oral  PainSc: 0-No pain      Patients Stated Pain Goal: 8 (37/50/51 0712)  Complications: No apparent anesthesia complications

## 2019-03-08 ENCOUNTER — Telehealth: Payer: Self-pay | Admitting: Cardiology

## 2019-03-08 NOTE — Telephone Encounter (Signed)

## 2019-03-09 ENCOUNTER — Ambulatory Visit (INDEPENDENT_AMBULATORY_CARE_PROVIDER_SITE_OTHER): Payer: PPO

## 2019-03-09 ENCOUNTER — Other Ambulatory Visit: Payer: Self-pay

## 2019-03-09 ENCOUNTER — Other Ambulatory Visit: Payer: Self-pay | Admitting: Internal Medicine

## 2019-03-09 DIAGNOSIS — I739 Peripheral vascular disease, unspecified: Secondary | ICD-10-CM

## 2019-03-14 ENCOUNTER — Ambulatory Visit (INDEPENDENT_AMBULATORY_CARE_PROVIDER_SITE_OTHER): Payer: PPO | Admitting: Nurse Practitioner

## 2019-03-14 ENCOUNTER — Telehealth: Payer: Self-pay | Admitting: *Deleted

## 2019-03-14 ENCOUNTER — Encounter (INDEPENDENT_AMBULATORY_CARE_PROVIDER_SITE_OTHER): Payer: Self-pay | Admitting: Nurse Practitioner

## 2019-03-14 ENCOUNTER — Other Ambulatory Visit: Payer: Self-pay

## 2019-03-14 VITALS — BP 124/78 | HR 92 | Temp 97.8°F | Ht 62.0 in | Wt 125.8 lb

## 2019-03-14 DIAGNOSIS — D649 Anemia, unspecified: Secondary | ICD-10-CM | POA: Insufficient documentation

## 2019-03-14 DIAGNOSIS — D539 Nutritional anemia, unspecified: Secondary | ICD-10-CM | POA: Diagnosis not present

## 2019-03-14 DIAGNOSIS — R1013 Epigastric pain: Secondary | ICD-10-CM | POA: Diagnosis not present

## 2019-03-14 DIAGNOSIS — G8929 Other chronic pain: Secondary | ICD-10-CM | POA: Insufficient documentation

## 2019-03-14 DIAGNOSIS — D509 Iron deficiency anemia, unspecified: Secondary | ICD-10-CM | POA: Insufficient documentation

## 2019-03-14 DIAGNOSIS — R112 Nausea with vomiting, unspecified: Secondary | ICD-10-CM | POA: Insufficient documentation

## 2019-03-14 NOTE — Telephone Encounter (Signed)
Pt aware - routed to pcp  

## 2019-03-14 NOTE — Progress Notes (Signed)
Subjective:    Patient ID: [name], female    DOB: 1946/04/19, 73 y.o.   MRN: [id number]  HPI  [name] is a 74 year old female with a past medical history significant for HTN, MR,  COPD, pacemaker secondary to sick sinus syndrome, peptic ulcer disease and ? GI bleed, past Nissen Fundoplication and depression. She presents today with complaints of having epigastric abdominal pain which started 1 month ago.  She is taking omeprazole 40 mg once daily. Her epigastric pain has not improved, described as a dull ache. No specific food triggers. She has some nausea in the mornings. Her mouth waters at times but she does not vomit. She is passing a soft formed brown stool most days.  No melena.  She sees pinkish-red blood on the toilet tissue and sometimes a few streaks on the stool.  No rectal pain.  No lower abdominal pain.  No fever sweats or chills.  No weight loss. Labs 03/03/2019: Hg 9.8. HCT 30.3. MCV 103.4. She takes Ferrous Sulfate 325mg  once daily for the past month or two.    She underwent a colonoscopy  03/03/2019 due to having rectal bleeding for the past month. The colonoscopy identified perianal skin tags, diverticulosis in the sigmoid, descending and transverse colon and external hemorrhoids.   Her most recent EGD was 09/17/2016: Non-bleeding esophageal ulcers involving proximal third. Biopsied. Cells for cytology obtained. - Normal mid esophagus and distal esophagus. - Z-line irregular, 36 cm from the incisors. Tight LES. Dilated with balloon dilator to 18 mm - A 361 degree fundoplication was found. The wrap appears tight. - Non-bleeding gastric ulcers with no stigmata of bleeding. Biopsied. - Normal duodenal bulb. - Duodenal deformity. - Normal second portion of the duodenum.   Past Medical History:  Diagnosis Date  . Chronic pain   . 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 Depression   . GERD (gastroesophageal reflux disease)   . Hypertension   . Mitral and aortic regurgitation    Mild  . Neurodermatitis   . Peptic ulcer   . Sick sinus syndrome ([facility])    s/p PPM ([facility])  . Syncope    Past Surgical History:  Procedure Laterality Date  . APPENDECTOMY    . CARPAL TUNNEL RELEASE    . CHOLECYSTECTOMY    . COLONOSCOPY WITH PROPOFOL N/A 03/03/2019   Procedure: COLONOSCOPY WITH PROPOFOL;  Surgeon: Rogene Houston, MD;  Location: [facility];  Service: Endoscopy;  Laterality: N/A;  9:50  . ESOPHAGEAL DILATION N/A 01/15/2017   Procedure: ESOPHAGEAL DILATION;  Surgeon: Rogene Houston, MD;  Location: [facility];  Service: Endoscopy;  Laterality: N/A;  . ESOPHAGOGASTRODUODENOSCOPY (EGD) WITH PROPOFOL N/A 01/15/2017   Procedure: ESOPHAGOGASTRODUODENOSCOPY (EGD) WITH PROPOFOL;  Surgeon: Rogene Houston, MD;  Location: [facility];  Service: Endoscopy;  Laterality: N/A;  9:15  . NISSEN FUNDOPLICATION    . PACEMAKER INSERTION     [employer]  . SHOULDER SURGERY    . SPLENECTOMY     Current Outpatient Medications on File Prior to Visit  Medication Sig Dispense Refill  . acetaminophen (TYLENOL) 500 MG tablet Take 1,000 mg by mouth every 6 (six) hours as needed for moderate pain.    Marland Kitchen albuterol (VENTOLIN HFA) 108 (90 Base) MCG/ACT inhaler Inhale 2 puffs into the lungs every 6 (six) hours as needed for wheezing or shortness of breath.    . ALPRAZolam (XANAX) 1 MG tablet Take 1 mg by mouth 2 (two) times daily.     Marland Kitchen  Ascorbic Acid (VITAMIN C) 1000 MG tablet Take 1,000 mg by mouth daily.    . Calcium Carb-Cholecalciferol (CALCIUM 600 + D PO) Take 1 tablet by mouth daily.    . cetirizine (ZYRTEC) 10 MG tablet Take 10 mg by mouth daily.    . Cholecalciferol (VITAMIN D3) 1000 units CAPS Take 1,000 Units by mouth daily.   0  . ferrous sulfate 325 (65 FE) MG tablet Take 325 mg by mouth daily with breakfast.    . folic acid (FOLVITE) 1 MG tablet Take 1 mg by mouth daily.  1  . gabapentin (NEURONTIN) 400 MG capsule Take 400-800 mg by mouth See admin instructions. Take 400 mg in the  morning and take 800 mg at night    . gentamicin (GARAMYCIN) 0.3 % ophthalmic solution Place 2 drops into both eyes at bedtime.    . hydrocortisone (PROCTOSOL HC) 2.5 % rectal cream Place 1 application rectally 2 (two) times daily. 30 g 1  . ipratropium-albuterol (DUONEB) 0.5-2.5 (3) MG/3ML SOLN Take 3 mLs by nebulization every 4 (four) hours as needed (shortness of breath).    Marland Kitchen levothyroxine (SYNTHROID) 100 MCG tablet Take 100 mcg by mouth daily before breakfast.    . methotrexate 2.5 MG tablet Take 10 mg by mouth every Friday.     . Omega-3 Fatty Acids (FISH OIL) 1000 MG CAPS Take 1,000 mg by mouth daily.     Marland Kitchen omeprazole (PRILOSEC) 40 MG capsule Take 40 mg by mouth daily.    . sertraline (ZOLOFT) 50 MG tablet Take 50 mg by mouth daily.    . sucralfate (CARAFATE) 1 g tablet Take 1 g by mouth 3 (three) times daily.    . tizanidine (ZANAFLEX) 2 MG capsule Take 2 mg by mouth 2 (two) times daily.     . traZODone (DESYREL) 50 MG tablet Take 25 mg by mouth at bedtime.     . triamcinolone cream (KENALOG) 0.1 % Apply 1 application topically 2 (two) times daily as needed (for skin irritation.).     No current facility-administered medications on file prior to visit.    No Known Allergies  Review of Systems  See HPI, all other systems reviewed and are negative     Objective:   Physical Exam  BP 124/78   Pulse 92   Temp 97.8 F (36.6 C)   Ht 5\' 2"  (1.575 m)   Wt 125 lb 12.8 oz (57.1 kg)   BMI 23.01 kg/m   General: 73 year old female and well-developed in no acute distress Eyes: Sclera nonicteric, conjunctiva pink Mouth: Missing dentition, upper dentures Neck: Supple, no thyromegaly, no lymphadenopathy Heart: Regular rate and rhythm, systolic murmur Lungs: Breath sounds clear throughout Abdomen: Soft, nontender, no masses or organomegaly Extremities: No edema  Skin: Questionable vitiligo appearance to arms and legs Neuro: Alert and oriented x4, no focal deficits    Assessment &  Plan:  58.  73 year old female with a history of GERD and peptic ulcer disease with epigastric pain  -Schedule an EGD with Dr. Melony Overly -Increase omeprazole 40 mg 1 capsule twice daily -Avoid spicy and fatty foods Further evaluation to be determined after EGD completed  2. IDA on Ferrous Sulfate 325mg  QD -repeat H/H -check iron levels and Vitamin B12

## 2019-03-14 NOTE — Telephone Encounter (Signed)
-----   Message from [name], RN sent at 03/13/2019  9:39 AM EDT ----- [name] Pt

## 2019-03-14 NOTE — Patient Instructions (Signed)
1. Increase Omeprazole 40mg  one capsule to be taken 30 minutes before breakfast and one capsule 30 minutes before breakfast  2. Schedule an upper endoscopy with Dr. Laural Golden  3. Complete the ordered blood tests   4. Avoid spicy and fatty foods  5. Call our office if your symptoms worsen

## 2019-03-15 ENCOUNTER — Encounter (INDEPENDENT_AMBULATORY_CARE_PROVIDER_SITE_OTHER): Payer: Self-pay | Admitting: *Deleted

## 2019-03-15 ENCOUNTER — Encounter (HOSPITAL_COMMUNITY): Payer: Self-pay | Admitting: Internal Medicine

## 2019-03-15 DIAGNOSIS — D509 Iron deficiency anemia, unspecified: Secondary | ICD-10-CM | POA: Insufficient documentation

## 2019-03-15 LAB — HEMOGLOBIN AND HEMATOCRIT, BLOOD
HCT: 33.7 % — ABNORMAL LOW (ref 35.0–45.0)
Hemoglobin: 11.1 g/dL — ABNORMAL LOW (ref 11.7–15.5)

## 2019-03-15 LAB — IRON,TIBC AND FERRITIN PANEL
%SAT: 31 % (calc) (ref 16–45)
Ferritin: 121 ng/mL (ref 16–288)
Iron: 94 ug/dL (ref 45–160)
TIBC: 304 mcg/dL (calc) (ref 250–450)

## 2019-03-15 LAB — VITAMIN B12: Vitamin B-12: 485 pg/mL (ref 200–1100)

## 2019-03-16 ENCOUNTER — Other Ambulatory Visit (INDEPENDENT_AMBULATORY_CARE_PROVIDER_SITE_OTHER): Payer: Self-pay | Admitting: Nurse Practitioner

## 2019-03-16 ENCOUNTER — Telehealth (INDEPENDENT_AMBULATORY_CARE_PROVIDER_SITE_OTHER): Payer: Self-pay | Admitting: Nurse Practitioner

## 2019-03-16 DIAGNOSIS — K219 Gastro-esophageal reflux disease without esophagitis: Secondary | ICD-10-CM

## 2019-03-16 MED ORDER — OMEPRAZOLE 40 MG PO CPDR: 40.0000 mg | DELAYED_RELEASE_CAPSULE | Freq: Every day | ORAL | 1 refills | Status: DC

## 2019-03-16 NOTE — Telephone Encounter (Signed)
Estill Bamberg with [facility] called stated patient was there to pick up Omeprazole and there was no prescription for it  - please call [phone]

## 2019-03-16 NOTE — Telephone Encounter (Signed)
I resent RX to [facility]

## 2019-03-22 ENCOUNTER — Other Ambulatory Visit (HOSPITAL_COMMUNITY): Payer: PPO

## 2019-03-28 NOTE — Patient Instructions (Addendum)
65    Your procedure is scheduled on: 04/07/2019  Report to [facility] at  11:30   AM.  Call this number if you have problems the morning of surgery: [phone]   Remember:   Do not drink or eat food:After Midnight.      Take these medicines the morning of surgery with A SIP OF WATER: Zoloft, Zyrtec, levothyroxine, omeprazole, use Gabapentin and                   Duoneb if needed   Do not wear jewelry, make-up or nail polish.  Do not wear lotions, powders, or perfumes. You may wear deodorant.                Do not bring valuables to the hospital.  Contacts, dentures or bridgework may not be worn into surgery.  Leave suitcase in the car. After surgery it may be brought to your room.  For patients admitted to the hospital, checkout time is 11:00 AM the day of discharge.   Patients discharged the day of surgery will not be allowed to drive home.      Endoscopy Care After Please read the instructions outlined below and refer to this sheet in the next few weeks. These discharge instructions provide you with general information on caring for yourself after you leave the hospital. Your doctor may also give you specific instructions. While your treatment has been planned according to the most current medical practices available, unavoidable complications occasionally occur. If you have any problems or questions after discharge, please call your doctor. HOME CARE INSTRUCTIONS Activity  You may resume your regular activity but move at a slower pace for the next 24 hours.   Take frequent rest periods for the next 24 hours.   Walking will help expel (get rid of) the air and reduce the bloated feeling in your abdomen.   No driving for 24 hours (because of the anesthesia (medicine) used during the test).   You may shower.   Do not sign any important legal documents or operate any machinery for 24 hours (because of the anesthesia used during the test).  Nutrition  Drink plenty of fluids.    You may resume your normal diet.   Begin with a light meal and progress to your normal diet.   Avoid alcoholic beverages for 24 hours or as instructed by your caregiver.  Medications You may resume your normal medications unless your caregiver tells you otherwise. What you can expect today  You may experience abdominal discomfort such as a feeling of fullness or "gas" pains.   You may experience a sore throat for 2 to 3 days. This is normal. Gargling with salt water may help this.  Follow-up Your doctor will discuss the results of your test with you. SEEK IMMEDIATE MEDICAL CARE IF:  You have excessive nausea (feeling sick to your stomach) and/or vomiting.   You have severe abdominal pain and distention (swelling).   You have trouble swallowing.   You have a temperature over 100 F (37.8 C).   You have rectal bleeding or vomiting of blood.  Document Released: 02/25/2004 Document Revised: 07/02/2011 Document Reviewed: 09/07/2007

## 2019-03-31 ENCOUNTER — Other Ambulatory Visit (INDEPENDENT_AMBULATORY_CARE_PROVIDER_SITE_OTHER): Payer: Self-pay | Admitting: Internal Medicine

## 2019-04-04 ENCOUNTER — Other Ambulatory Visit (HOSPITAL_COMMUNITY): Payer: PPO

## 2019-04-04 ENCOUNTER — Encounter (HOSPITAL_COMMUNITY): Payer: Self-pay

## 2019-04-04 ENCOUNTER — Encounter (HOSPITAL_COMMUNITY)
Admission: RE | Admit: 2019-04-04 | Discharge: 2019-04-04 | Disposition: A | Discharge status: Home / Self Care | Payer: PPO | Source: Ambulatory Visit | Attending: Internal Medicine | Admitting: Internal Medicine

## 2019-04-05 ENCOUNTER — Other Ambulatory Visit (HOSPITAL_COMMUNITY): Payer: PPO

## 2019-04-06 ENCOUNTER — Encounter (INDEPENDENT_AMBULATORY_CARE_PROVIDER_SITE_OTHER): Payer: Self-pay | Admitting: *Deleted

## 2019-04-10 ENCOUNTER — Telehealth: Payer: Self-pay | Admitting: Cardiology

## 2019-04-10 NOTE — Telephone Encounter (Signed)
Virtual Visit Pre-Appointment Phone Call  "(Name), I am calling you today to discuss your upcoming appointment. We are currently trying to limit exposure to the virus that causes COVID-19 by seeing patients at home rather than in the office."  1. "What is the BEST phone number to call the day of the visit?" - include this in appointment notes  2. Do you have or have access to (through a family member/friend) a smartphone with video capability that we can use for your visit?" a. If yes - list this number in appt notes as cell (if different from BEST phone #) and list the appointment type as a VIDEO visit in appointment notes b. If no - list the appointment type as a PHONE visit in appointment notes  3. Confirm consent - "In the setting of the current Covid19 crisis, you are scheduled for a (phone or video) visit with your provider on (date) at (time).  Just as we do with many in-office visits, in order for you to participate in this visit, we must obtain consent.  If you'd like, I can send this to your mychart (if signed up) or email for you to review.  Otherwise, I can obtain your verbal consent now.  All virtual visits are billed to your insurance company just like a normal visit would be.  By agreeing to a virtual visit, we'd like you to understand that the technology does not allow for your provider to perform an examination, and thus may limit your provider's ability to fully assess your condition. If your provider identifies any concerns that need to be evaluated in person, we will make arrangements to do so.  Finally, though the technology is pretty good, we cannot assure that it will always work on either your or our end, and in the setting of a video visit, we may have to convert it to a phone-only visit.  In either situation, we cannot ensure that we have a secure connection.  Are you willing to proceed?" STAFF: Did the patient verbally acknowledge consent to telehealth visit? Document  YES/NO here: YES  4. Advise patient to be prepared - "Two hours prior to your appointment, go ahead and check your blood pressure, pulse, oxygen saturation, and your weight (if you have the equipment to check those) and write them all down. When your visit starts, your provider will ask you for this information. If you have an Apple Watch or Kardia device, please plan to have heart rate information ready on the day of your appointment. Please have a pen and paper handy nearby the day of the visit as well."  5. Give patient instructions for MyChart download to smartphone OR [email or web address] as below if video visit (depending on what platform provider is using)  6. Inform patient they will receive a phone call 15 minutes prior to their appointment time (may be from unknown caller ID) so they should be prepared to answer    TELEPHONE CALL NOTE  [name] has been deemed a candidate for a follow-up tele-health visit to limit community exposure during the Covid-19 pandemic. I spoke with the patient via phone to ensure availability of phone/video source, confirm preferred email & phone number, and discuss instructions and expectations.  I reminded [name] to be prepared with any vital sign and/or heart rhythm information that could potentially be obtained via home monitoring, at the time of her visit. I reminded [name] to expect a phone call prior to her visit.  [name] 04/10/2019 9:09 AM   INSTRUCTIONS FOR DOWNLOADING THE MYCHART APP TO SMARTPHONE  - The patient must first make sure to have activated MyChart and know their login information - If Apple, go to [employer] and type in MyChart in the search bar and download the app. If Android, ask patient to go to [employer] and type in [place] in the search bar and download the app. The app is free but as with any other app downloads, their phone may require them to verify saved payment information or Apple/Android  password.  - The patient will need to then log into the app with their MyChart username and password, and select [facility] [facility] as their healthcare provider to link the account. When it is time for your visit, go to the MyChart app, find appointments, and click Begin Video Visit. Be sure to Select Allow for your device to access the Microphone and Camera for your visit. You will then be connected, and your provider will be with you shortly.  **If they have any issues connecting, or need assistance please contact MyChart service desk [phone])**  **If using a computer, in order to ensure the best quality for their visit they will need to use either of the following Internet Browsers: [employer], or Google Chrome**  IF USING DOXIMITY or DOXY.ME - The patient will receive a link just prior to their visit by text.     FULL LENGTH CONSENT FOR TELE-HEALTH VISIT   I hereby voluntarily request, consent and authorize [facility] and its employed or contracted physicians, physician assistants, nurse practitioners or other licensed health care professionals (the Practitioner), to provide me with telemedicine health care services (the Services") as deemed necessary by the treating Practitioner. I acknowledge and consent to receive the Services by the Practitioner via telemedicine. I understand that the telemedicine visit will involve communicating with the Practitioner through live audiovisual communication technology and the disclosure of certain medical information by electronic transmission. I acknowledge that I have been given the opportunity to request an in-person assessment or other available alternative prior to the telemedicine visit and am voluntarily participating in the telemedicine visit.  I understand that I have the right to withhold or withdraw my consent to the use of telemedicine in the course of my care at any time, without affecting my right to future care or treatment,  and that the Practitioner or I may terminate the telemedicine visit at any time. I understand that I have the right to inspect all information obtained and/or recorded in the course of the telemedicine visit and may receive copies of available information for a reasonable fee.  I understand that some of the potential risks of receiving the Services via telemedicine include:   Delay or interruption in medical evaluation due to technological equipment failure or disruption;  Information transmitted may not be sufficient (e.g. poor resolution of images) to allow for appropriate medical decision making by the Practitioner; and/or   In rare instances, security protocols could fail, causing a breach of personal health information.  Furthermore, I acknowledge that it is my responsibility to provide information about my medical history, conditions and care that is complete and accurate to the best of my ability. I acknowledge that Practitioner's advice, recommendations, and/or decision may be based on factors not within their control, such as incomplete or inaccurate data provided by me or distortions of diagnostic images or specimens that may result from electronic transmissions. I understand that the  practice of medicine is not an Chief Strategy Officer and that Practitioner makes no warranties or guarantees regarding treatment outcomes. I acknowledge that I will receive a copy of this consent concurrently upon execution via email to the email address I last provided but may also request a printed copy by calling the office of [place] [facility].    I understand that my insurance will be billed for this visit.   I have read or had this consent read to me.  I understand the contents of this consent, which adequately explains the benefits and risks of the Services being provided via telemedicine.   I have been provided ample opportunity to ask questions regarding this consent and the Services and have had my questions  answered to my satisfaction.  I give my informed consent for the services to be provided through the use of telemedicine in my medical care  By participating in this telemedicine visit I agree to the above.

## 2019-04-11 ENCOUNTER — Telehealth (INDEPENDENT_AMBULATORY_CARE_PROVIDER_SITE_OTHER): Payer: PPO | Admitting: Cardiology

## 2019-04-11 ENCOUNTER — Encounter: Payer: Self-pay | Admitting: Cardiology

## 2019-04-11 VITALS — Ht 62.0 in

## 2019-04-11 DIAGNOSIS — I495 Sick sinus syndrome: Secondary | ICD-10-CM | POA: Diagnosis not present

## 2019-04-11 DIAGNOSIS — Z950 Presence of cardiac pacemaker: Secondary | ICD-10-CM | POA: Diagnosis not present

## 2019-04-11 DIAGNOSIS — R011 Cardiac murmur, unspecified: Secondary | ICD-10-CM | POA: Diagnosis not present

## 2019-04-11 NOTE — Patient Instructions (Addendum)
Medication Instructions:   Your physician recommends that you continue on your current medications as directed. Please refer to the Current Medication list given to you today.  Labwork:  NONE  Testing/Procedures: Your physician has requested that you have an echocardiogram in one year just before your next visit. Echocardiography is a painless test that uses sound waves to create images of your heart. It provides your doctor with information about the size and shape of your heart and how well your heart's chambers and valves are working. This procedure takes approximately one hour. There are no restrictions for this procedure.  Follow-Up:  Your physician recommends that you schedule a follow-up appointment in: 1 year. You will receive a reminder letter in the mail in about 10 months reminding you to call and schedule your appointment. If you don't receive this letter, please contact our office.  Any Other Special Instructions Will Be Listed Below (If Applicable).  If you need a refill on your cardiac medications before your next appointment, please call your pharmacy. 

## 2019-04-11 NOTE — Progress Notes (Signed)
Virtual Visit via Telephone Note   This visit type was conducted due to national recommendations for restrictions regarding the COVID-19 Pandemic (e.g. social distancing) in an effort to limit this patient's exposure and mitigate transmission in our community.  Due to her co-morbid illnesses, this patient is at least at moderate risk for complications without adequate follow up.  This format is felt to be most appropriate for this patient at this time.  The patient did not have access to video technology/had technical difficulties with video requiring transitioning to audio format only (telephone).  All issues noted in this document were discussed and addressed.  No physical exam could be performed with this format.  Please refer to the patient's chart for her  consent to telehealth for [facility].   Date:  04/11/2019   ID:  [name], DOB 1946-05-19, MRN [id number]  Patient Location: Home Provider Location: Office  PCP:  Neale Burly, MD  Cardiologist:  Rozann Lesches, MD Electrophysiologist:  Thompson Grayer, MD   Evaluation Performed:  Follow-Up Visit  Chief Complaint:   Cardiac follow-up  History of Present Illness:    [name] is a 73 y.o. female last seen in August 2019.  We spoke by phone today.  She does not report any exertional chest pain or unusual degree of shortness of breath with typical activities.  She sees Dr. Rayann Heman in the device clinic, Hyde pacemaker in place.  Device function was normal by interrogation in July.  She does not report any palpitations or syncope.  I reviewed her medications which are listed below.  She did have lower extremity arterial Dopplers obtained by Dr. Rayann Heman given reported leg pain and concern for claudication.  ABIs were normal.  She is on gabapentin for treatment of neuropathy.  Last echocardiogram was in 2017 at which time LVEF was 55 to 60%.  She had aortic valve sclerosis but no stenosis.  The patient does not have  symptoms concerning for COVID-19 infection (fever, chills, cough, or new shortness of breath).  Recent SARS coronavirus 2 test was negative in August.   Past Medical History:  Diagnosis Date  . Chronic pain   . COPD with emphysema ([place])   . Depression   . GERD (gastroesophageal reflux disease)   . Hypertension   . Mitral and aortic regurgitation    Mild  . Neurodermatitis   . Peptic ulcer   . Sick sinus syndrome ([facility])    s/p PPM ([facility])  . Syncope    Past Surgical History:  Procedure Laterality Date  . APPENDECTOMY    . CARPAL TUNNEL RELEASE    . CHOLECYSTECTOMY    . COLONOSCOPY WITH PROPOFOL N/A 03/03/2019   Procedure: COLONOSCOPY WITH PROPOFOL;  Surgeon: Rogene Houston, MD;  [facility]: [facility];  Service: Endoscopy;  Laterality: N/A;  9:50  . ESOPHAGEAL DILATION N/A 01/15/2017   Procedure: ESOPHAGEAL DILATION;  Surgeon: Rogene Houston, MD;  Location: [facility];  Service: Endoscopy;  Laterality: N/A;  . ESOPHAGOGASTRODUODENOSCOPY (EGD) WITH PROPOFOL N/A 01/15/2017   Procedure: ESOPHAGOGASTRODUODENOSCOPY (EGD) WITH PROPOFOL;  Surgeon: Rogene Houston, MD;  Location: [facility];  Service: Endoscopy;  Laterality: N/A;  9:15  . NISSEN FUNDOPLICATION    . PACEMAKER INSERTION     [employer] Jude  . SHOULDER SURGERY    . SPLENECTOMY       Current Meds  Medication Sig  . albuterol (VENTOLIN HFA) 108 (90 Base) MCG/ACT inhaler Inhale 2 puffs into the  lungs every 6 (six) hours as needed for wheezing or shortness of breath.  . ALPRAZolam (XANAX) 1 MG tablet Take 1 mg by mouth 2 (two) times daily.   . Ascorbic Acid (VITAMIN C) 1000 MG tablet Take 1,000 mg by mouth daily.  . Calcium Carb-Cholecalciferol (CALCIUM 600 + D PO) Take 1 tablet by mouth daily.  . cetirizine (ZYRTEC) 10 MG tablet Take 10 mg by mouth daily.  . Cholecalciferol (VITAMIN D3) 1000 units CAPS Take 1,000 Units by mouth daily.   . ferrous sulfate 325 (65 FE) MG tablet Take 325 mg by mouth daily with breakfast.  .  folic acid (FOLVITE) 1 MG tablet Take 1 mg by mouth daily.  Marland Kitchen gabapentin (NEURONTIN) 400 MG capsule Take 400-800 mg by mouth See admin instructions. Take 400 mg in the morning and take 800 mg at night  . gentamicin (GARAMYCIN) 0.3 % ophthalmic solution Place 1 drop into both eyes at bedtime.   Marland Kitchen ipratropium-albuterol (DUONEB) 0.5-2.5 (3) MG/3ML SOLN Take 3 mLs by nebulization every 4 (four) hours as needed (shortness of breath).  Marland Kitchen levothyroxine (SYNTHROID) 100 MCG tablet Take 100 mcg by mouth daily before breakfast.  . methotrexate 2.5 MG tablet Take 10 mg by mouth every Friday.   . Omega-3 Fatty Acids (FISH OIL) 1000 MG CAPS Take 1,000 mg by mouth daily.   Marland Kitchen omeprazole (PRILOSEC) 40 MG capsule Take 1 capsule (40 mg total) by mouth daily.  Marland Kitchen PROCTO-MED HC 2.5 % rectal cream INSERT ONE APPLICATORFUL RECTALLY TWICE DAILY  . sertraline (ZOLOFT) 50 MG tablet Take 50 mg by mouth daily.  . sucralfate (CARAFATE) 1 g tablet Take 1 g by mouth 3 (three) times daily.  . tizanidine (ZANAFLEX) 2 MG capsule Take 2 mg by mouth 2 (two) times daily.   . traZODone (DESYREL) 50 MG tablet Take 25 mg by mouth at bedtime.   . triamcinolone cream (KENALOG) 0.1 % Apply 1 application topically 2 (two) times daily as needed (for skin irritation.).  Marland Kitchen vitamin E 400 UNIT capsule Take 400 Units by mouth daily.     Allergies:   Patient has no known allergies.   Social History   Tobacco Use  . Smoking status: Former Smoker    Packs/day: 2.00    Years: 40.00    Pack years: 80.00    Types: Cigarettes    Start date: 11/13/1963    Quit date: 08/18/2012    Years since quitting: 6.6  . Smokeless tobacco: Never Used  . Tobacco comment: I am pleased that she recently quit.  Substance Use Topics  . Alcohol use: No    Alcohol/week: 0.0 standard drinks    Comment: Former heavy drinker  . Drug use: No    Comment: Former benodiazepine dependence     Family Hx: The patient's family history is not on file.  ROS:    Please see the history of present illness.    Hearing loss.  All other systems reviewed and are negative.   Prior CV studies:   The following studies were reviewed today:  Echocardiogram 11/07/2015: Study Conclusions  - Left ventricle: The cavity size was normal. Wall thickness was normal. Systolic function was normal. The estimated ejection fraction was in the range of 55% to 60%. Wall motion was normal; there were no regional wall motion abnormalities. Doppler parameters are consistent with abnormal left ventricular relaxation (grade 1 diastolic dysfunction). - Aortic valve: Mildly calcified annulus. Trileaflet; mildly calcified leaflets. There was trivial regurgitation. - Mitral  valve: Calcified annulus. There was trivial regurgitation. - Right ventricle: Pacer wire or catheter noted in right ventricle. - Right atrium: Central venous pressure (est): 3 mm Hg. - Tricuspid valve: There was mild regurgitation. - Pulmonary arteries: PA peak pressure: 30 mm Hg (S). - Pericardium, extracardiac: A prominent pericardial fat pad was present.  Impressions:  - Normal LV wall thickness with LVEF 55-60%. Grade 1 diastolic dysfunction with normal estimated LV filling pressure. Trivial mitral regurgitation. Mildly sclerotic aortic valve with trivial aortic regurgitation. Mild tricuspid regurgitation with PASP 30 mmHg. Device wire present in right heart.  Lower extremity arterial studies 03/09/2019: Summary: Right: Resting right ankle-brachial index is within normal range. No evidence of significant right lower extremity arterial disease. The right toe-brachial index is normal.  Left: Resting left ankle-brachial index is within normal range. No evidence of significant left lower extremity arterial disease. The left toe-brachial index is normal.  Labs/Other Tests and Data Reviewed:    EKG:  An ECG dated 03/16/2018 was personally reviewed today and demonstrated:   Sinus rhythm with low voltage.  Recent Labs: 03/03/2019: Platelets 293 03/14/2019: Hemoglobin 11.1    Wt Readings from Last 3 Encounters:  03/14/19 125 lb 12.8 oz (57.1 kg)  02/21/19 130 lb 1.6 oz (59 kg)  11/25/18 121 lb 9.6 oz (55.2 kg)     Objective:    Vital Signs:  Ht 5\' 2"  (1.575 m)   BMI 23.01 kg/m    She did not have a way to check vital signs today. Patient spoke in full sentences, not short of breath. No audible wheezing or coughing.  ASSESSMENT & PLAN:    1.  Sick sinus syndrome status post St. Jude pacemaker, followed by Dr. Rayann Heman.  Recent device interrogation was normal.  2.  Heart murmur with aortic valve sclerosis by echocardiogram back in 2017.  Updated study will be obtained prior to her next visit in 1 year.  COVID-19 Education: The signs and symptoms of COVID-19 were discussed with the patient and how to seek care for testing (follow up with PCP or arrange E-visit).  The importance of social distancing was discussed today.  Time:   Today, I have spent 5 minutes with the patient with telehealth technology discussing the above problems.     Medication Adjustments/Labs and Tests Ordered: Current medicines are reviewed at length with the patient today.  Concerns regarding medicines are outlined above.   Tests Ordered: Orders Placed This Encounter  Procedures  . ECHOCARDIOGRAM COMPLETE    Medication Changes: No orders of the defined types were placed in this encounter.   Follow Up:  In Person 1 year in the [place] office.  Signed, Rozann Lesches, MD  04/11/2019 11:15 AM    [employer]

## 2019-04-26 ENCOUNTER — Other Ambulatory Visit (HOSPITAL_COMMUNITY): Payer: PPO

## 2019-04-27 ENCOUNTER — Ambulatory Visit (INDEPENDENT_AMBULATORY_CARE_PROVIDER_SITE_OTHER): Payer: PPO | Admitting: *Deleted

## 2019-04-27 DIAGNOSIS — I495 Sick sinus syndrome: Secondary | ICD-10-CM | POA: Diagnosis not present

## 2019-04-27 LAB — CUP PACEART REMOTE DEVICE CHECK
Battery Remaining Longevity: 18 mo
Battery Remaining Percentage: 12 %
Battery Voltage: 2.72 V
Brady Statistic AP VP Percent: 1 %
Brady Statistic AP VS Percent: 62 %
Brady Statistic AS VP Percent: 1 %
Brady Statistic AS VS Percent: 37 %
Brady Statistic RA Percent Paced: 63 %
Brady Statistic RV Percent Paced: 1 %
Date Time Interrogation Session: 20201001120722
Implantable Lead Implant Date: 20100908
Implantable Lead Implant Date: 20100908
Implantable Lead Location: 753859
Implantable Lead Location: 753860
Implantable Lead Model: 1944
Implantable Lead Model: 1948
Implantable Pulse Generator Implant Date: 20100908
Lead Channel Impedance Value: 530 Ohm
Lead Channel Impedance Value: 580 Ohm
Lead Channel Pacing Threshold Amplitude: 0.5 V
Lead Channel Pacing Threshold Amplitude: 0.5 V
Lead Channel Pacing Threshold Pulse Width: 0.4 ms
Lead Channel Pacing Threshold Pulse Width: 0.5 ms
Lead Channel Sensing Intrinsic Amplitude: 10.3 mV
Lead Channel Sensing Intrinsic Amplitude: 3.7 mV
Lead Channel Setting Pacing Amplitude: 0.75 V
Lead Channel Setting Pacing Amplitude: 1.5 V
Lead Channel Setting Pacing Pulse Width: 0.4 ms
Lead Channel Setting Sensing Sensitivity: 3 mV
Pulse Gen Model: 2110
Pulse Gen Serial Number: 2300232

## 2019-05-01 DIAGNOSIS — J449 Chronic obstructive pulmonary disease, unspecified: Secondary | ICD-10-CM | POA: Diagnosis not present

## 2019-05-01 DIAGNOSIS — E038 Other specified hypothyroidism: Secondary | ICD-10-CM | POA: Diagnosis not present

## 2019-05-01 DIAGNOSIS — Z6823 Body mass index (BMI) 23.0-23.9, adult: Secondary | ICD-10-CM | POA: Diagnosis not present

## 2019-05-01 DIAGNOSIS — B3789 Other sites of candidiasis: Secondary | ICD-10-CM | POA: Diagnosis not present

## 2019-05-01 DIAGNOSIS — F064 Anxiety disorder due to known physiological condition: Secondary | ICD-10-CM | POA: Diagnosis not present

## 2019-05-01 DIAGNOSIS — M545 Low back pain: Secondary | ICD-10-CM | POA: Diagnosis not present

## 2019-05-04 ENCOUNTER — Encounter: Payer: Self-pay | Admitting: Cardiology

## 2019-05-04 NOTE — Progress Notes (Signed)
Remote pacemaker transmission.   

## 2019-05-10 ENCOUNTER — Other Ambulatory Visit (HOSPITAL_COMMUNITY)
Admission: RE | Admit: 2019-05-10 | Discharge: 2019-05-10 | Disposition: A | Discharge status: Home / Self Care | Payer: PPO | Source: Ambulatory Visit | Attending: Internal Medicine | Admitting: Internal Medicine

## 2019-05-10 ENCOUNTER — Encounter (HOSPITAL_COMMUNITY)
Admission: RE | Admit: 2019-05-10 | Discharge: 2019-05-10 | Disposition: A | Discharge status: Home / Self Care | Payer: PPO | Source: Ambulatory Visit | Attending: Internal Medicine | Admitting: Internal Medicine

## 2019-05-10 ENCOUNTER — Other Ambulatory Visit: Payer: Self-pay

## 2019-05-10 DIAGNOSIS — Z01812 Encounter for preprocedural laboratory examination: Secondary | ICD-10-CM | POA: Insufficient documentation

## 2019-05-10 DIAGNOSIS — B3789 Other sites of candidiasis: Secondary | ICD-10-CM | POA: Diagnosis not present

## 2019-05-10 DIAGNOSIS — Z20828 Contact with and (suspected) exposure to other viral communicable diseases: Secondary | ICD-10-CM | POA: Insufficient documentation

## 2019-05-10 DIAGNOSIS — J449 Chronic obstructive pulmonary disease, unspecified: Secondary | ICD-10-CM | POA: Diagnosis not present

## 2019-05-10 DIAGNOSIS — M545 Low back pain: Secondary | ICD-10-CM | POA: Diagnosis not present

## 2019-05-10 DIAGNOSIS — E038 Other specified hypothyroidism: Secondary | ICD-10-CM | POA: Diagnosis not present

## 2019-05-10 LAB — SARS CORONAVIRUS 2 (TAT 6-24 HRS): SARS Coronavirus 2: NEGATIVE

## 2019-05-12 ENCOUNTER — Other Ambulatory Visit: Payer: Self-pay

## 2019-05-12 ENCOUNTER — Encounter (HOSPITAL_COMMUNITY): Payer: Self-pay

## 2019-05-12 ENCOUNTER — Ambulatory Visit (HOSPITAL_COMMUNITY)
Admission: RE | Admit: 2019-05-12 | Discharge: 2019-05-12 | Disposition: A | Discharge status: Home / Self Care | Payer: PPO | Attending: Internal Medicine | Admitting: Internal Medicine

## 2019-05-12 ENCOUNTER — Ambulatory Visit (HOSPITAL_COMMUNITY): Payer: PPO | Admitting: Anesthesiology

## 2019-05-12 ENCOUNTER — Encounter (HOSPITAL_COMMUNITY)
Admission: RE | Disposition: A | Discharge status: Home / Self Care | Payer: Self-pay | Source: Home / Self Care | Attending: Internal Medicine

## 2019-05-12 DIAGNOSIS — K449 Diaphragmatic hernia without obstruction or gangrene: Secondary | ICD-10-CM | POA: Insufficient documentation

## 2019-05-12 DIAGNOSIS — F329 Major depressive disorder, single episode, unspecified: Secondary | ICD-10-CM | POA: Diagnosis not present

## 2019-05-12 DIAGNOSIS — K644 Residual hemorrhoidal skin tags: Secondary | ICD-10-CM | POA: Insufficient documentation

## 2019-05-12 DIAGNOSIS — Z79890 Hormone replacement therapy (postmenopausal): Secondary | ICD-10-CM | POA: Insufficient documentation

## 2019-05-12 DIAGNOSIS — K9189 Other postprocedural complications and disorders of digestive system: Secondary | ICD-10-CM | POA: Diagnosis not present

## 2019-05-12 DIAGNOSIS — Z87891 Personal history of nicotine dependence: Secondary | ICD-10-CM | POA: Diagnosis not present

## 2019-05-12 DIAGNOSIS — Z79899 Other long term (current) drug therapy: Secondary | ICD-10-CM | POA: Diagnosis not present

## 2019-05-12 DIAGNOSIS — K2950 Unspecified chronic gastritis without bleeding: Secondary | ICD-10-CM | POA: Diagnosis not present

## 2019-05-12 DIAGNOSIS — R1013 Epigastric pain: Secondary | ICD-10-CM

## 2019-05-12 DIAGNOSIS — K3189 Other diseases of stomach and duodenum: Secondary | ICD-10-CM | POA: Diagnosis not present

## 2019-05-12 DIAGNOSIS — I10 Essential (primary) hypertension: Secondary | ICD-10-CM | POA: Insufficient documentation

## 2019-05-12 DIAGNOSIS — Z8711 Personal history of peptic ulcer disease: Secondary | ICD-10-CM | POA: Insufficient documentation

## 2019-05-12 DIAGNOSIS — K219 Gastro-esophageal reflux disease without esophagitis: Secondary | ICD-10-CM | POA: Insufficient documentation

## 2019-05-12 DIAGNOSIS — K2960 Other gastritis without bleeding: Secondary | ICD-10-CM | POA: Diagnosis not present

## 2019-05-12 DIAGNOSIS — Z9889 Other specified postprocedural states: Secondary | ICD-10-CM | POA: Insufficient documentation

## 2019-05-12 DIAGNOSIS — Z950 Presence of cardiac pacemaker: Secondary | ICD-10-CM | POA: Insufficient documentation

## 2019-05-12 DIAGNOSIS — G8929 Other chronic pain: Secondary | ICD-10-CM | POA: Insufficient documentation

## 2019-05-12 DIAGNOSIS — D509 Iron deficiency anemia, unspecified: Secondary | ICD-10-CM | POA: Diagnosis not present

## 2019-05-12 DIAGNOSIS — K259 Gastric ulcer, unspecified as acute or chronic, without hemorrhage or perforation: Secondary | ICD-10-CM | POA: Diagnosis not present

## 2019-05-12 DIAGNOSIS — K228 Other specified diseases of esophagus: Secondary | ICD-10-CM | POA: Insufficient documentation

## 2019-05-12 DIAGNOSIS — K2970 Gastritis, unspecified, without bleeding: Secondary | ICD-10-CM | POA: Diagnosis not present

## 2019-05-12 HISTORY — PX: ESOPHAGOGASTRODUODENOSCOPY (EGD) WITH PROPOFOL: SHX5813

## 2019-05-12 HISTORY — PX: BIOPSY: SHX5522

## 2019-05-12 SURGERY — ESOPHAGOGASTRODUODENOSCOPY (EGD) WITH PROPOFOL
Anesthesia: General

## 2019-05-12 MED ORDER — CHLORHEXIDINE GLUCONATE CLOTH 2 % EX PADS: 6.0000 | MEDICATED_PAD | Freq: Once | CUTANEOUS | Status: DC

## 2019-05-12 MED ORDER — LACTATED RINGERS IV SOLN
INTRAVENOUS | Status: DC
  Administered 2019-05-12: 08:00:00 via INTRAVENOUS

## 2019-05-12 MED ORDER — STERILE WATER FOR IRRIGATION IR SOLN
Status: DC | PRN
  Administered 2019-05-12: 2.5 mL

## 2019-05-12 MED ORDER — PROMETHAZINE HCL 25 MG/ML IJ SOLN: 6.2500 mg | INTRAMUSCULAR | Status: DC | PRN

## 2019-05-12 MED ORDER — PROPOFOL 500 MG/50ML IV EMUL
INTRAVENOUS | Status: DC | PRN
  Administered 2019-05-12: 100 ug/kg/min via INTRAVENOUS

## 2019-05-12 MED ORDER — KETAMINE HCL 10 MG/ML IJ SOLN
INTRAMUSCULAR | Status: DC | PRN
  Administered 2019-05-12: 20 mg via INTRAVENOUS

## 2019-05-12 MED ORDER — HYDROCODONE-ACETAMINOPHEN 7.5-325 MG PO TABS: 1.0000 | ORAL_TABLET | Freq: Once | ORAL | Status: DC | PRN

## 2019-05-12 MED ORDER — MIDAZOLAM HCL 2 MG/2ML IJ SOLN: 0.5000 mg | Freq: Once | INTRAMUSCULAR | Status: DC | PRN

## 2019-05-12 MED ORDER — PROPOFOL 10 MG/ML IV BOLUS
INTRAVENOUS | Status: AC
  Filled 2019-05-12: qty 40

## 2019-05-12 MED ORDER — KETAMINE HCL 50 MG/5ML IJ SOSY
PREFILLED_SYRINGE | INTRAMUSCULAR | Status: AC
  Filled 2019-05-12: qty 5

## 2019-05-12 MED ORDER — HYDROMORPHONE HCL 1 MG/ML IJ SOLN: 0.2500 mg | INTRAMUSCULAR | Status: DC | PRN

## 2019-05-12 NOTE — Op Note (Signed)
[facility] Patient Name: [name] Procedure Date: 05/12/2019 8:13 AM MRN: [id number] Date of Birth: August 06, 1945 Attending MD: Hildred Laser , MD CSN: [id number] Age: 73 Admit Type: Outpatient Procedure:                Upper GI endoscopy Indications:              Epigastric abdominal pain; history of esophagitis                            and gastric ulcers. Providers:                Hildred Laser, MD, Lurline Del, RN Referring MD:             Stoney Bang, MD Medicines:                Propofol per Anesthesia Complications:            No immediate complications. Estimated Blood Loss:     Estimated blood loss was minimal. Procedure:                Pre-Anesthesia Assessment:                           - Prior to the procedure, a History and Physical                            was performed, and patient medications and                            allergies were reviewed. The patient's tolerance of                            previous anesthesia was also reviewed. The risks                            and benefits of the procedure and the sedation                            options and risks were discussed with the patient.                            All questions were answered, and informed consent                            was obtained. Prior Anticoagulants: The patient has                            taken no previous anticoagulant or antiplatelet                            agents. ASA Grade Assessment: IV - A patient with                            severe systemic disease that is a constant threat  to life. After reviewing the risks and benefits,                            the patient was deemed in satisfactory condition to                            undergo the procedure.                           After obtaining informed consent, the endoscope was                            passed under direct vision. Throughout the                             procedure, the patient's blood pressure, pulse, and                            oxygen saturations were monitored continuously. The                            [id number]-[id number]) scope was introduced through the                            mouth, and advanced to the second part of duodenum.                            The upper GI endoscopy was accomplished without                            difficulty. The patient tolerated the procedure                            well. Scope In: 8:47:47 AM Scope Out: 8:57:19 AM Total Procedure Duration: 0 hours 9 minutes 32 seconds  Findings:      The examined esophagus was normal.      The Z-line was irregular and was found 37 cm from the incisors.      A 2 cm hiatal hernia was present.      Diffuse mild inflammation characterized by congestion (edema) and       granularity was found in the gastric antrum. Biopsies were taken with a       cold forceps for histology. The pathology specimen was placed into       Bottle Number 2.      Localized mucosal changes characterized by erythema and flattening were       found in the prepyloric region of the stomach. Biopsies were taken with       a cold forceps for histology. The pathology specimen was placed into       Bottle Number 1.      Evidence of a Nissen fundoplication was found in the cardia. The wrap       appeared loose.      The duodenal bulb and second portion of the duodenum were normal. Impression:               -  Normal esophagus. Esophagitis has healed.                           - Z-line irregular, 37 cm from the incisors.                           - 2 cm hiatal hernia.                           - Gastritis. Biopsied.                           - Erythematous and flattened mucosa in the                            prepyloric region of the stomach. Site of previous                            ulcer. Biopsied.                           - A Nissen fundoplication was found. The wrap                             appears loose.                           - Normal duodenal bulb and second portion of the                            duodenum. Moderate Sedation:      Per Anesthesia Care Recommendation:           - Patient has a contact number available for                            emergencies. The signs and symptoms of potential                            delayed complications were discussed with the                            patient. Return to normal activities tomorrow.                            Written discharge instructions were provided to the                            patient.                           - Resume previous diet today.                           - Continue present medications.                           -  No aspirin, ibuprofen, naproxen, or other                            non-steroidal anti-inflammatory drugs.                           - Await pathology results. Procedure Code(s):        --- Professional ---                           [phone], Esophagogastroduodenoscopy, flexible,                            transoral; with biopsy, single or multiple Diagnosis Code(s):        --- Professional ---                           K22.8, Other specified diseases of esophagus                           K44.9, Diaphragmatic hernia without obstruction or                            gangrene                           K29.70, Gastritis, unspecified, without bleeding                           K31.89, Other diseases of stomach and duodenum                           Z98.890, Other specified postprocedural states                           R10.13, Epigastric pain CPT copyright 2019 American Medical Association. All rights reserved. The codes documented in this report are preliminary and upon coder review may  be revised to meet current compliance requirements. Hildred Laser, MD Hildred Laser, MD 05/12/2019 9:08:00 AM This report has been signed electronically. Number of Addenda: 0

## 2019-05-12 NOTE — Discharge Instructions (Signed)
No aspirin or NSAIDs for 24 hours. Resume other medications and diet as before. No driving for 24 hours. Physician will call with biopsy results and further recommendations.   Monitored Anesthesia Care, Care After These instructions provide you with information about caring for yourself after your procedure. Your health care provider may also give you more specific instructions. Your treatment has been planned according to current medical practices, but problems sometimes occur. Call your health care provider if you have any problems or questions after your procedure. What can I expect after the procedure? After your procedure, you may:  Feel sleepy for several hours.  Feel clumsy and have poor balance for several hours.  Feel forgetful about what happened after the procedure.  Have poor judgment for several hours.  Feel nauseous or vomit.  Have a sore throat if you had a breathing tube during the procedure. Follow these instructions at home: For at least 24 hours after the procedure:      Have a responsible adult stay with you. It is important to have someone help care for you until you are awake and alert.  Rest as needed.  Do not: ? Participate in activities in which you could fall or become injured. ? Drive. ? Use heavy machinery. ? Drink alcohol. ? Take sleeping pills or medicines that cause drowsiness. ? Make important decisions or sign legal documents. ? Take care of children on your own. Eating and drinking  Follow the diet that is recommended by your health care provider.  If you vomit, drink water, juice, or soup when you can drink without vomiting.  Make sure you have little or no nausea before eating solid foods. General instructions  Take over-the-counter and prescription medicines only as told by your health care provider.  If you have sleep apnea, surgery and certain medicines can increase your risk for breathing problems. Follow instructions from  your health care provider about wearing your sleep device: ? Anytime you are sleeping, including during daytime naps. ? While taking prescription pain medicines, sleeping medicines, or medicines that make you drowsy.  If you smoke, do not smoke without supervision.  Keep all follow-up visits as told by your health care provider. This is important. Contact a health care provider if:  You keep feeling nauseous or you keep vomiting.  You feel light-headed.  You develop a rash.  You have a fever. Get help right away if:  You have trouble breathing. Summary  For several hours after your procedure, you may feel sleepy and have poor judgment.  Have a responsible adult stay with you for at least 24 hours or until you are awake and alert. This information is not intended to replace advice given to you by your health care provider. Make sure you discuss any questions you have with your health care provider. Document Released: 11/03/2015 Document Revised: 10/11/2017 Document Reviewed: 11/03/2015 Elsevier Patient Education  2020 [facility], Adult, Care After This sheet gives you information about how to care for yourself after your procedure. Your health care provider may also give you more specific instructions. If you have problems or questions, contact your health care provider. What can I expect after the procedure? After the procedure, it is common to have:  A sore throat.  Mild stomach pain or discomfort.  Bloating.  Nausea. Follow these instructions at home:   Follow instructions from your health care provider about what to eat or drink after your procedure.  Return to your normal  activities as told by your health care provider. Ask your health care provider what activities are safe for you.  Take over-the-counter and prescription medicines only as told by your health care provider.  Do not drive for 24 hours if you were given a sedative during your  procedure.  Keep all follow-up visits as told by your health care provider. This is important. Contact a health care provider if you have:  A sore throat that lasts longer than one day.  Trouble swallowing. Get help right away if:  You vomit blood or your vomit looks like coffee grounds.  You have: ? A fever. ? Bloody, black, or tarry stools. ? A severe sore throat or you cannot swallow. ? Difficulty breathing. ? Severe pain in your chest or abdomen. Summary  After the procedure, it is common to have a sore throat, mild stomach discomfort, bloating, and nausea.  Do not drive for 24 hours if you were given a sedative during the procedure.  Follow instructions from your health care provider about what to eat or drink after your procedure.  Return to your normal activities as told by your health care provider. This information is not intended to replace advice given to you by your health care provider. Make sure you discuss any questions you have with your health care provider. Document Released: 01/12/2012 Document Revised: 01/04/2018 Document Reviewed: 12/13/2017 Elsevier Patient Education  2020 [employer].

## 2019-05-12 NOTE — Anesthesia Postprocedure Evaluation (Signed)
Anesthesia Post Note  Patient: [name]  Procedure(s) Performed: ESOPHAGOGASTRODUODENOSCOPY (EGD) WITH PROPOFOL (N/A )  Patient location during evaluation: PACU Anesthesia Type: MAC Level of consciousness: awake and alert and oriented Pain management: pain level controlled Vital Signs Assessment: post-procedure vital signs reviewed and stable Respiratory status: spontaneous breathing, respiratory function stable and nonlabored ventilation Cardiovascular status: stable Postop Assessment: no apparent nausea or vomiting Anesthetic complications: no     Last Vitals:  Vitals:   05/12/19 0758  BP: (!) 157/60  Pulse: 60  Resp: 16  Temp: 36.6 C  SpO2: 97%    Last Pain:  Vitals:   05/12/19 0758  TempSrc: Oral  PainSc: 0-No pain                 Danisha Brassfield

## 2019-05-12 NOTE — Transfer of Care (Signed)
Immediate Anesthesia Transfer of Care Note  Patient: [name]  Procedure(s) Performed: ESOPHAGOGASTRODUODENOSCOPY (EGD) WITH PROPOFOL (N/A )  Patient Location: PACU  Anesthesia Type:MAC  Level of Consciousness: awake, alert , oriented and patient cooperative  Airway & Oxygen Therapy: Patient Spontanous Breathing and Patient connected to nasal cannula oxygen  Post-op Assessment: Report given to RN, Post -op Vital signs reviewed and stable and Patient moving all extremities X 4  Post vital signs: Reviewed and stable  Last Vitals:  Vitals Value Taken Time  BP    Temp    Pulse    Resp    SpO2      Last Pain:  Vitals:   05/12/19 0758  TempSrc: Oral  PainSc: 0-No pain      Patients Stated Pain Goal: 6 (37/90/24 0973)  Complications: No apparent anesthesia complications

## 2019-05-12 NOTE — H&P (Addendum)
[name] is an 73 y.o. female.   Chief Complaint: Patient is here for esophagogastroduodenoscopy. HPI: Patient is 73 year old Caucasian female with multiple medical problems who also has history of esophageal and gastric ulcers who continues to experience epigastric pain radiating to both subcostal regions.  She denies nausea vomiting or heartburn.  She was seen in the office recently and PPI dose was doubled.  She does not report any improvement.  She denies dysphagia melena or rectal bleeding.  She states she eats 1 meal a day but she is not losing weight.  She says pain is worse when she wakes up in the morning and she may get some relief in the middle of the day.  Pain not relieved with meals.  Patient's last EGD was in June 2018.  She also has a history of iron deficiency anemia.  She underwent colonoscopy on 03/03/2019 for rectal bleeding and found to have sigmoid diverticulosis and external hemorrhoids.  Patient does not take OTC NSAIDs.  Patient states she stopped methotrexate on her own 1 month ago.   Past Medical History:  Diagnosis Date  . Chronic pain   . COPD with emphysema ([place])   . Depression   . GERD (gastroesophageal reflux disease)   . Hypertension   . Mitral and aortic regurgitation    Mild  . Neurodermatitis   . Peptic ulcer   . Sick sinus syndrome ([facility])    s/p PPM ([facility])  . Syncope     Past Surgical History:  Procedure Laterality Date  . APPENDECTOMY    . CARPAL TUNNEL RELEASE    . CHOLECYSTECTOMY    . COLONOSCOPY WITH PROPOFOL N/A 03/03/2019   Procedure: COLONOSCOPY WITH PROPOFOL;  Surgeon: Rogene Houston, MD;  [facility];  Service: Endoscopy;  Laterality: N/A;  9:50  . ESOPHAGEAL DILATION N/A 01/15/2017   Procedure: ESOPHAGEAL DILATION;  Surgeon: Rogene Houston, MD;  [facility];  Service: Endoscopy;  Laterality: N/A;  . ESOPHAGOGASTRODUODENOSCOPY (EGD) WITH PROPOFOL N/A 01/15/2017   Procedure: ESOPHAGOGASTRODUODENOSCOPY (EGD) WITH  PROPOFOL;  Surgeon: Rogene Houston, MD;  [facility];  Service: Endoscopy;  Laterality: N/A;  9:15  . NISSEN FUNDOPLICATION    . PACEMAKER INSERTION     [employer] Jude  . SHOULDER SURGERY    . SPLENECTOMY      History reviewed. No pertinent family history. Social History:  reports that she quit smoking about 6 years ago. Her smoking use included cigarettes. She started smoking about 55 years ago. She has a 80.00 pack-year smoking history. She has never used smokeless tobacco. She reports that she does not drink alcohol or use drugs.  Allergies: No Known Allergies  Medications Prior to Admission  Medication Sig Dispense Refill  . albuterol (VENTOLIN HFA) 108 (90 Base) MCG/ACT inhaler Inhale 2 puffs into the lungs every 6 (six) hours as needed for wheezing or shortness of breath.    . ALPRAZolam (XANAX) 1 MG tablet Take 1 mg by mouth 2 (two) times daily.     . Ascorbic Acid (VITAMIN C) 1000 MG tablet Take 1,000 mg by mouth daily.    . cetirizine (ZYRTEC) 10 MG tablet Take 10 mg by mouth daily.    . Cholecalciferol (VITAMIN D3) 1000 units CAPS Take 1,000 Units by mouth daily.   0  . ferrous sulfate 325 (65 FE) MG tablet Take 325 mg by mouth daily with breakfast.    . folic acid (FOLVITE) 1 MG tablet Take 1  mg by mouth daily.  1  . gabapentin (NEURONTIN) 400 MG capsule Take 400-800 mg by mouth See admin instructions. Take 1 capsule (400 mg) by mouth in the morning & take 2 capsules (800 mg) by mouth at night.    Marland Kitchen ipratropium-albuterol (DUONEB) 0.5-2.5 (3) MG/3ML SOLN Take 3 mLs by nebulization every 4 (four) hours as needed (shortness of breath).    Marland Kitchen levothyroxine (SYNTHROID) 100 MCG tablet Take 100 mcg by mouth daily before breakfast.    . methotrexate 2.5 MG tablet Take 10 mg by mouth every Friday.     . Omega-3 Fatty Acids (FISH OIL) 1000 MG CAPS Take 1,000 mg by mouth daily.     Marland Kitchen omeprazole (PRILOSEC) 40 MG capsule Take 1 capsule (40 mg total) by mouth daily. 90 capsule 1  .  sertraline (ZOLOFT) 50 MG tablet Take 50 mg by mouth daily.    . sucralfate (CARAFATE) 1 g tablet Take 1 g by mouth 3 (three) times daily.    . tizanidine (ZANAFLEX) 2 MG capsule Take 2 mg by mouth 2 (two) times daily.     . traZODone (DESYREL) 50 MG tablet Take 25 mg by mouth at bedtime.     . triamcinolone cream (KENALOG) 0.1 % Apply 1 application topically 2 (two) times daily as needed (for skin irritation.).    Marland Kitchen Vitamin A ([employer] VITAMIN A) 2400 MCG (8000 UT) CAPS Take 2,400 mcg by mouth daily.    . vitamin E 100 UNIT capsule Take 100 Units by mouth daily.    Marland Kitchen PROCTO-MED HC 2.5 % rectal cream INSERT ONE APPLICATORFUL RECTALLY TWICE DAILY (Patient not taking: Reported on 05/04/2019) 30 g 0    No results found for this or any previous visit (from the past 48 hour(s)). No results found.  ROS  Blood pressure (!) 157/60, pulse 60, temperature 97.8 F (36.6 C), temperature source Oral, resp. rate 16, SpO2 97 %. Physical Exam  Constitutional: She appears well-developed and well-nourished.  HENT:  Mouth/Throat: Oropharynx is clear and moist.  Eyes: Conjunctivae are normal. No scleral icterus.  Neck: No thyromegaly present.  Cardiovascular: Normal rate, regular rhythm and normal heart sounds.  Grade 2/6 systolic ejection murmur best heard at aortic area.  Respiratory: Effort normal and breath sounds normal.  She has pacemaker in left pectoral region.  GI:  Abdomen is symmetrical.  She has upper midline and right subcostal scars.  On palpation abdomen is soft.  She has mild mid epigastric and subcostal tenderness on either side.  No organomegaly or masses.  Musculoskeletal:        General: No edema.  Lymphadenopathy:    She has no cervical adenopathy.  Neurological: She is alert.  Skin: Skin is warm and dry.     Assessment/Plan Epigastric pain. History of esophagitis and peptic ulcer disease. Diagnostic esophagogastroduodenoscopy.  Hildred Laser, MD 05/12/2019, 8:31 AM

## 2019-05-12 NOTE — Anesthesia Preprocedure Evaluation (Signed)
Anesthesia Evaluation  Patient identified by MRN, date of birth, ID band Patient awake    Reviewed: Allergy & Precautions, NPO status , Patient's Chart, lab work & pertinent test results  Airway Mallampati: I  TM Distance: >3 FB Neck ROM: Full    Dental no notable dental hx. (+) Edentulous Upper, Edentulous Lower   Pulmonary shortness of breath and with exertion, COPD,  COPD inhaler, former smoker,  Denies smoking or o2 use  Uses nebs /duoneb at home    Pulmonary exam normal breath sounds clear to auscultation       Cardiovascular Exercise Tolerance: Poor hypertension, Pt. on medications Normal cardiovascular exam+ pacemaker II Rhythm:Regular Rate:Normal  Reports pacer placed ~10 years prior -poor historian  Denies recent CP    Neuro/Psych PSYCHIATRIC DISORDERS Depression negative neurological ROS     GI/Hepatic Neg liver ROS, PUD, GERD  Medicated and Controlled,  Endo/Other  negative endocrine ROS  Renal/GU negative Renal ROS  negative genitourinary   Musculoskeletal negative musculoskeletal ROS (+)   Abdominal   Peds negative pediatric ROS (+)  Hematology negative hematology ROS (+) anemia ,   Anesthesia Other Findings   Reproductive/Obstetrics negative OB ROS                             Anesthesia Physical Anesthesia Plan  ASA: IV  Anesthesia Plan: General   Post-op Pain Management:    Induction: Intravenous  PONV Risk Score and Plan: 3 and TIVA, Ondansetron, Propofol infusion and Treatment may vary due to age or medical condition  Airway Management Planned: Nasal Cannula and Simple Face Mask  Additional Equipment:   Intra-op Plan:   Post-operative Plan:   Informed Consent: I have reviewed the patients History and Physical, chart, labs and discussed the procedure including the risks, benefits and alternatives for the proposed anesthesia with the patient or authorized  representative who has indicated his/her understanding and acceptance.     Dental advisory given  Plan Discussed with: CRNA  Anesthesia Plan Comments: (Plan Full PPE use  Plan GA with GETA as needed d/w pt -WTP with same after Q&APlan Full PPE use  Plan GA with GETA as needed d/w pt -WTP with same after Q&A)        Anesthesia Quick Evaluation

## 2019-05-15 DIAGNOSIS — Z6824 Body mass index (BMI) 24.0-24.9, adult: Secondary | ICD-10-CM | POA: Diagnosis not present

## 2019-05-15 DIAGNOSIS — L309 Dermatitis, unspecified: Secondary | ICD-10-CM | POA: Diagnosis not present

## 2019-05-15 LAB — SURGICAL PATHOLOGY

## 2019-05-18 ENCOUNTER — Encounter (HOSPITAL_COMMUNITY): Payer: Self-pay | Admitting: Internal Medicine

## 2019-05-31 DIAGNOSIS — Z6824 Body mass index (BMI) 24.0-24.9, adult: Secondary | ICD-10-CM | POA: Diagnosis not present

## 2019-05-31 DIAGNOSIS — L309 Dermatitis, unspecified: Secondary | ICD-10-CM | POA: Diagnosis not present

## 2019-06-29 DIAGNOSIS — Z0000 Encounter for general adult medical examination without abnormal findings: Secondary | ICD-10-CM | POA: Diagnosis not present

## 2019-06-29 DIAGNOSIS — Z6824 Body mass index (BMI) 24.0-24.9, adult: Secondary | ICD-10-CM | POA: Diagnosis not present

## 2019-06-29 DIAGNOSIS — L309 Dermatitis, unspecified: Secondary | ICD-10-CM | POA: Diagnosis not present

## 2019-06-29 DIAGNOSIS — E038 Other specified hypothyroidism: Secondary | ICD-10-CM | POA: Diagnosis not present

## 2019-06-29 DIAGNOSIS — F411 Generalized anxiety disorder: Secondary | ICD-10-CM | POA: Diagnosis not present

## 2019-06-29 DIAGNOSIS — L299 Pruritus, unspecified: Secondary | ICD-10-CM | POA: Diagnosis not present

## 2019-07-13 DIAGNOSIS — H524 Presbyopia: Secondary | ICD-10-CM | POA: Diagnosis not present

## 2019-07-18 ENCOUNTER — Encounter (INDEPENDENT_AMBULATORY_CARE_PROVIDER_SITE_OTHER): Payer: Self-pay | Admitting: Internal Medicine

## 2019-07-18 ENCOUNTER — Other Ambulatory Visit: Payer: Self-pay

## 2019-07-18 ENCOUNTER — Ambulatory Visit (INDEPENDENT_AMBULATORY_CARE_PROVIDER_SITE_OTHER): Payer: PPO | Admitting: Internal Medicine

## 2019-07-18 VITALS — Ht 62.0 in | Wt 135.0 lb

## 2019-07-18 DIAGNOSIS — D649 Anemia, unspecified: Secondary | ICD-10-CM | POA: Diagnosis not present

## 2019-07-18 DIAGNOSIS — R1013 Epigastric pain: Secondary | ICD-10-CM | POA: Diagnosis not present

## 2019-07-18 DIAGNOSIS — R140 Abdominal distension (gaseous): Secondary | ICD-10-CM

## 2019-07-18 NOTE — Patient Instructions (Signed)
Patient advised to discontinue sucralfate as it is not helping. Patient advised not to take OTC NSAIDs. He can try Tylenol for pain up to 2 g a day on as-needed basis. I will be calling patient with results of blood test and CT when completed.

## 2019-07-18 NOTE — Progress Notes (Signed)
Virtual Visit via Telephone Note  Patient had scheduled face-to-face visit today.  It was decided to change it to virtual/telephone visit because of ongoing Covid-19 pandemic.  We both agreed. I connected with [name] on 07/18/19 at  3:00 PM EST by telephone and verified that I am speaking with the correct person using two identifiers.  Location: Patient: home Provider: office   I discussed the limitations, risks, security and privacy concerns of performing an evaluation and management service by telephone and the availability of in person appointments. I also discussed with the patient that there may be a patient responsible charge related to this service. The patient expressed understanding and agreed to proceed.   History of Present Illness:  Patient is 73 year old Caucasian female who has history of chronic GERD status post Nissen fundoplication was seen in the office about 4 months ago for persistent epigastric pain radiating to both subcostal regions not associated with nausea vomiting or heartburn.  PPI dose was doubled but she reported no improvement.  Her last EGD was in June 2018 when she presented with dysphagia and she was noted to have proximal esophageal ulcers felt to be pill esophagitis and she improved with therapy.  She also had colonoscopy in August 2020 for rectal bleeding which revealed colonic diverticulosis and external hemorrhoids. Since patient did not improve with double dose PPI we proceeded with esophagogastroduodenoscopy on 05/12/2019.  She was noted to have small sliding hiatal hernia and loose fundal wrap focal gastritis and prepyloric region which was felt to be site of previous ulceration.  Biopsy from this area revealed marked nonspecific reactive gastropathy with erosion and intestinal metaplasia but no dysplasia.  Antral biopsy reveals mild chronic gastritis with focal intestinal metaplasia and no dysplasia.  Patient was advised not to take any NSAIDs.  She was  advised to continue omeprazole 40 mg daily and sucralfate 1 g 4 times a day.  Patient reports feeling no better.  She has daily epigastric pain.  Pain radiates to left as well as right subcostal region.  Pain is mild when she wakes up and as the day progresses it gets worse.  She could not tell any difference with sucralfate.  She says her appetite is good but she is only eating 1 meal a day.  She denies nausea vomiting but complains of abdominal distention.  She says her bowels are moving daily and she denies melena or rectal bleeding.  She reports that she has gained 10 pounds in the last 4 months.  He does not understand why she is gaining weight even though she eats 1 meal a day. Patient says she does not use bronchodilators on daily basis. She quit cigarette smoking about 7 years ago.  She does not drink alcohol. She did not have blood work as planned.  Medication list reviewed with the patient and it was updated.   Observations/Objective:  Patient reported her weight to be 135 pounds.  Assessment and Plan:  #1.  Epigastric pain.  She has not responded to therapy.  Recent EGD showed gastritis but no ulceration.  She also had intestinal metaplasia but no dysplasia.  This finding would not explain patient's pain.  She has had cholecystectomy.  Given persistent symptoms need to rule out pancreatic disease.  #2.  Chronic GERD.  Heartburn is well controlled with therapy.  She will continue double dose PPI for the time being.  Follow Up Instructions:  Patient advised to discontinue sucralfate as it is not helping.  Patient will  go to the lab for CBC with differential, serum lipase and c-Met. Abdominal pelvic CT with contrast to be scheduled. Follow-up visit to be determined after the studies completed. I discussed the assessment and treatment plan with the patient. The patient was provided an opportunity to ask questions and all were answered. The patient agreed with the plan and demonstrated  an understanding of the instructions.   The patient was advised to call back or seek an in-person evaluation if the symptoms worsen or if the condition fails to improve as anticipated.  I provided 12 minutes of non-face-to-face time during this encounter.   Hildred Laser, MD

## 2019-07-19 ENCOUNTER — Other Ambulatory Visit (INDEPENDENT_AMBULATORY_CARE_PROVIDER_SITE_OTHER): Payer: Self-pay | Admitting: *Deleted

## 2019-07-19 DIAGNOSIS — R1013 Epigastric pain: Secondary | ICD-10-CM

## 2019-07-26 ENCOUNTER — Ambulatory Visit (HOSPITAL_COMMUNITY): Payer: PPO

## 2019-07-27 ENCOUNTER — Ambulatory Visit (INDEPENDENT_AMBULATORY_CARE_PROVIDER_SITE_OTHER): Payer: PPO | Admitting: *Deleted

## 2019-07-27 DIAGNOSIS — I495 Sick sinus syndrome: Secondary | ICD-10-CM | POA: Diagnosis not present

## 2019-07-27 LAB — CUP PACEART REMOTE DEVICE CHECK
Battery Remaining Longevity: 12 mo
Battery Remaining Percentage: 8 %
Battery Voltage: 2.69 V
Brady Statistic AP VP Percent: 1 %
Brady Statistic AP VS Percent: 61 %
Brady Statistic AS VP Percent: 1 %
Brady Statistic AS VS Percent: 38 %
Brady Statistic RA Percent Paced: 61 %
Brady Statistic RV Percent Paced: 1 %
Date Time Interrogation Session: 20201231093903
Implantable Lead Implant Date: 20100908
Implantable Lead Implant Date: 20100908
Implantable Lead Location: 753859
Implantable Lead Location: 753860
Implantable Lead Model: 1944
Implantable Lead Model: 1948
Implantable Pulse Generator Implant Date: 20100908
Lead Channel Impedance Value: 460 Ohm
Lead Channel Impedance Value: 780 Ohm
Lead Channel Pacing Threshold Amplitude: 0.5 V
Lead Channel Pacing Threshold Amplitude: 0.625 V
Lead Channel Pacing Threshold Pulse Width: 0.4 ms
Lead Channel Pacing Threshold Pulse Width: 0.5 ms
Lead Channel Sensing Intrinsic Amplitude: 3.5 mV
Lead Channel Sensing Intrinsic Amplitude: 9.1 mV
Lead Channel Setting Pacing Amplitude: 0.875
Lead Channel Setting Pacing Amplitude: 1.5 V
Lead Channel Setting Pacing Pulse Width: 0.4 ms
Lead Channel Setting Sensing Sensitivity: 3 mV
Pulse Gen Model: 2110
Pulse Gen Serial Number: 2300232

## 2019-07-28 NOTE — Progress Notes (Signed)
PPM Remote  

## 2019-07-30 DIAGNOSIS — J440 Chronic obstructive pulmonary disease with acute lower respiratory infection: Secondary | ICD-10-CM | POA: Diagnosis not present

## 2019-07-30 DIAGNOSIS — Z87891 Personal history of nicotine dependence: Secondary | ICD-10-CM | POA: Diagnosis not present

## 2019-07-30 DIAGNOSIS — U071 COVID-19: Secondary | ICD-10-CM | POA: Diagnosis not present

## 2019-07-30 DIAGNOSIS — Z20828 Contact with and (suspected) exposure to other viral communicable diseases: Secondary | ICD-10-CM | POA: Diagnosis not present

## 2019-07-30 DIAGNOSIS — F411 Generalized anxiety disorder: Secondary | ICD-10-CM | POA: Diagnosis not present

## 2019-07-30 DIAGNOSIS — R05 Cough: Secondary | ICD-10-CM | POA: Diagnosis not present

## 2019-07-30 DIAGNOSIS — E039 Hypothyroidism, unspecified: Secondary | ICD-10-CM | POA: Diagnosis not present

## 2019-07-30 DIAGNOSIS — J441 Chronic obstructive pulmonary disease with (acute) exacerbation: Secondary | ICD-10-CM | POA: Diagnosis not present

## 2019-07-30 DIAGNOSIS — R509 Fever, unspecified: Secondary | ICD-10-CM | POA: Diagnosis not present

## 2019-07-30 DIAGNOSIS — J189 Pneumonia, unspecified organism: Secondary | ICD-10-CM | POA: Diagnosis not present

## 2019-07-31 DIAGNOSIS — U071 COVID-19: Secondary | ICD-10-CM | POA: Diagnosis not present

## 2019-07-31 DIAGNOSIS — F411 Generalized anxiety disorder: Secondary | ICD-10-CM | POA: Diagnosis not present

## 2019-07-31 DIAGNOSIS — E039 Hypothyroidism, unspecified: Secondary | ICD-10-CM | POA: Diagnosis not present

## 2019-07-31 DIAGNOSIS — J440 Chronic obstructive pulmonary disease with acute lower respiratory infection: Secondary | ICD-10-CM | POA: Diagnosis not present

## 2019-08-02 DIAGNOSIS — K219 Gastro-esophageal reflux disease without esophagitis: Secondary | ICD-10-CM | POA: Diagnosis not present

## 2019-08-02 DIAGNOSIS — G8929 Other chronic pain: Secondary | ICD-10-CM | POA: Diagnosis not present

## 2019-08-02 DIAGNOSIS — R197 Diarrhea, unspecified: Secondary | ICD-10-CM | POA: Diagnosis not present

## 2019-08-02 DIAGNOSIS — M545 Low back pain: Secondary | ICD-10-CM | POA: Diagnosis not present

## 2019-08-02 DIAGNOSIS — F411 Generalized anxiety disorder: Secondary | ICD-10-CM | POA: Diagnosis not present

## 2019-08-02 DIAGNOSIS — R531 Weakness: Secondary | ICD-10-CM | POA: Diagnosis not present

## 2019-08-02 DIAGNOSIS — E039 Hypothyroidism, unspecified: Secondary | ICD-10-CM | POA: Diagnosis not present

## 2019-08-02 DIAGNOSIS — R918 Other nonspecific abnormal finding of lung field: Secondary | ICD-10-CM | POA: Diagnosis not present

## 2019-08-02 DIAGNOSIS — J440 Chronic obstructive pulmonary disease with acute lower respiratory infection: Secondary | ICD-10-CM | POA: Diagnosis not present

## 2019-08-02 DIAGNOSIS — U071 COVID-19: Secondary | ICD-10-CM | POA: Diagnosis not present

## 2019-08-02 DIAGNOSIS — Z87891 Personal history of nicotine dependence: Secondary | ICD-10-CM | POA: Diagnosis not present

## 2019-08-02 DIAGNOSIS — J1282 Pneumonia due to coronavirus disease 2019: Secondary | ICD-10-CM | POA: Diagnosis not present

## 2019-08-02 DIAGNOSIS — R0602 Shortness of breath: Secondary | ICD-10-CM | POA: Diagnosis not present

## 2019-08-10 DIAGNOSIS — J449 Chronic obstructive pulmonary disease, unspecified: Secondary | ICD-10-CM | POA: Diagnosis not present

## 2019-08-10 DIAGNOSIS — U071 COVID-19: Secondary | ICD-10-CM | POA: Diagnosis not present

## 2019-08-23 DIAGNOSIS — Z6824 Body mass index (BMI) 24.0-24.9, adult: Secondary | ICD-10-CM | POA: Diagnosis not present

## 2019-08-23 DIAGNOSIS — U071 COVID-19: Secondary | ICD-10-CM | POA: Diagnosis not present

## 2019-09-27 DIAGNOSIS — J449 Chronic obstructive pulmonary disease, unspecified: Secondary | ICD-10-CM | POA: Diagnosis not present

## 2019-09-27 DIAGNOSIS — Z6823 Body mass index (BMI) 23.0-23.9, adult: Secondary | ICD-10-CM | POA: Diagnosis not present

## 2019-09-27 DIAGNOSIS — J301 Allergic rhinitis due to pollen: Secondary | ICD-10-CM | POA: Diagnosis not present

## 2019-09-27 DIAGNOSIS — K219 Gastro-esophageal reflux disease without esophagitis: Secondary | ICD-10-CM | POA: Diagnosis not present

## 2019-09-27 DIAGNOSIS — Z0000 Encounter for general adult medical examination without abnormal findings: Secondary | ICD-10-CM | POA: Diagnosis not present

## 2019-09-27 DIAGNOSIS — F418 Other specified anxiety disorders: Secondary | ICD-10-CM | POA: Diagnosis not present

## 2019-09-27 DIAGNOSIS — Z1331 Encounter for screening for depression: Secondary | ICD-10-CM | POA: Diagnosis not present

## 2019-09-27 DIAGNOSIS — E038 Other specified hypothyroidism: Secondary | ICD-10-CM | POA: Diagnosis not present

## 2019-10-16 DIAGNOSIS — Z1231 Encounter for screening mammogram for malignant neoplasm of breast: Secondary | ICD-10-CM | POA: Diagnosis not present

## 2019-10-24 DIAGNOSIS — L0100 Impetigo, unspecified: Secondary | ICD-10-CM | POA: Diagnosis not present

## 2019-10-24 DIAGNOSIS — L280 Lichen simplex chronicus: Secondary | ICD-10-CM | POA: Diagnosis not present

## 2019-10-26 ENCOUNTER — Ambulatory Visit (INDEPENDENT_AMBULATORY_CARE_PROVIDER_SITE_OTHER): Payer: PPO | Admitting: *Deleted

## 2019-10-26 DIAGNOSIS — I495 Sick sinus syndrome: Secondary | ICD-10-CM | POA: Diagnosis not present

## 2019-10-26 LAB — CUP PACEART REMOTE DEVICE CHECK
Battery Remaining Longevity: 7 mo
Battery Remaining Percentage: 5 %
Battery Voltage: 2.66 V
Brady Statistic AP VP Percent: 1 %
Brady Statistic AP VS Percent: 59 %
Brady Statistic AS VP Percent: 1 %
Brady Statistic AS VS Percent: 41 %
Brady Statistic RA Percent Paced: 59 %
Brady Statistic RV Percent Paced: 1 %
Date Time Interrogation Session: 20210401093933
Implantable Lead Implant Date: 20100908
Implantable Lead Implant Date: 20100908
Implantable Lead Location: 753859
Implantable Lead Location: 753860
Implantable Lead Model: 1944
Implantable Lead Model: 1948
Implantable Pulse Generator Implant Date: 20100908
Lead Channel Impedance Value: 440 Ohm
Lead Channel Impedance Value: 510 Ohm
Lead Channel Pacing Threshold Amplitude: 0.625 V
Lead Channel Pacing Threshold Amplitude: 0.75 V
Lead Channel Pacing Threshold Pulse Width: 0.4 ms
Lead Channel Pacing Threshold Pulse Width: 0.5 ms
Lead Channel Sensing Intrinsic Amplitude: 3.6 mV
Lead Channel Sensing Intrinsic Amplitude: 7.8 mV
Lead Channel Setting Pacing Amplitude: 1 V
Lead Channel Setting Pacing Amplitude: 1.625
Lead Channel Setting Pacing Pulse Width: 0.4 ms
Lead Channel Setting Sensing Sensitivity: 3 mV
Pulse Gen Model: 2110
Pulse Gen Serial Number: 2300232

## 2019-10-27 NOTE — Progress Notes (Signed)
PPM Remote  

## 2019-11-08 DIAGNOSIS — J301 Allergic rhinitis due to pollen: Secondary | ICD-10-CM | POA: Diagnosis not present

## 2019-11-08 DIAGNOSIS — K219 Gastro-esophageal reflux disease without esophagitis: Secondary | ICD-10-CM | POA: Diagnosis not present

## 2019-11-08 DIAGNOSIS — Z6824 Body mass index (BMI) 24.0-24.9, adult: Secondary | ICD-10-CM | POA: Diagnosis not present

## 2019-11-08 DIAGNOSIS — E038 Other specified hypothyroidism: Secondary | ICD-10-CM | POA: Diagnosis not present

## 2019-11-08 DIAGNOSIS — J449 Chronic obstructive pulmonary disease, unspecified: Secondary | ICD-10-CM | POA: Diagnosis not present

## 2019-11-08 DIAGNOSIS — F418 Other specified anxiety disorders: Secondary | ICD-10-CM | POA: Diagnosis not present

## 2019-12-01 ENCOUNTER — Telehealth: Payer: PPO | Admitting: Internal Medicine

## 2019-12-04 ENCOUNTER — Telehealth (INDEPENDENT_AMBULATORY_CARE_PROVIDER_SITE_OTHER): Payer: PPO | Admitting: Internal Medicine

## 2019-12-04 ENCOUNTER — Encounter: Payer: Self-pay | Admitting: Internal Medicine

## 2019-12-04 ENCOUNTER — Other Ambulatory Visit: Payer: Self-pay

## 2019-12-04 VITALS — Ht 62.0 in | Wt 135.0 lb

## 2019-12-04 DIAGNOSIS — I495 Sick sinus syndrome: Secondary | ICD-10-CM | POA: Diagnosis not present

## 2019-12-04 NOTE — Progress Notes (Signed)
Electrophysiology TeleHealth Note  Due to national recommendations of social distancing due to [name], an audio telehealth visit is felt to be most appropriate for this patient at this time.  Verbal consent was obtained by me for the telehealth visit today.  The patient does not have capability for a virtual visit.  A phone visit is therefore required today.   Date:  12/04/2019   ID:  [name], DOB 03-Apr-1946, MRN [id number]  Location: patient's home  Provider location:  [place]  Evaluation Performed: Follow-up visit  PCP:  Neale Burly, MD   Electrophysiologist:  Dr Rayann Heman  Chief Complaint:  palpitations  History of Present Illness:    [name] is a 74 y.o. female who presents via telehealth conferencing today.  Since last being seen in our clinic, the patient reports doing very well.  She had COVID in January but recovered nicely.  Doing well.   Today, she denies symptoms of palpitations, chest pain, shortness of breath,  lower extremity edema, dizziness, presyncope, or syncope.  The patient is otherwise without complaint today.     Past Medical History:  Diagnosis Date  . Chronic pain   . COPD with emphysema ([place])   . Depression   . GERD (gastroesophageal reflux disease)   . Hypertension   . Mitral and aortic regurgitation    Mild  . Neurodermatitis   . Peptic ulcer   . Sick sinus syndrome ([facility])    s/p PPM ([facility])  . Syncope     Past Surgical History:  Procedure Laterality Date  . APPENDECTOMY    . BIOPSY  05/12/2019   Procedure: BIOPSY;  Surgeon: Rogene Houston, MD;  [facility];  Service: Endoscopy;;  antral, pre-pyloric mucosa  . CARPAL TUNNEL RELEASE    . CHOLECYSTECTOMY    . COLONOSCOPY WITH PROPOFOL N/A 03/03/2019   Procedure: COLONOSCOPY WITH PROPOFOL;  Surgeon: Rogene Houston, MD;  [facility];  Service: Endoscopy;  Laterality: N/A;  9:50  . ESOPHAGEAL DILATION N/A 01/15/2017   Procedure: ESOPHAGEAL DILATION;   Surgeon: Rogene Houston, MD;  [facility];  Service: Endoscopy;  Laterality: N/A;  . ESOPHAGOGASTRODUODENOSCOPY (EGD) WITH PROPOFOL N/A 01/15/2017   Procedure: ESOPHAGOGASTRODUODENOSCOPY (EGD) WITH PROPOFOL;  Surgeon: Rogene Houston, MD;  [facility];  Service: Endoscopy;  Laterality: N/A;  9:15  . ESOPHAGOGASTRODUODENOSCOPY (EGD) WITH PROPOFOL N/A 05/12/2019   Procedure: ESOPHAGOGASTRODUODENOSCOPY (EGD) WITH PROPOFOL;  Surgeon: Rogene Houston, MD;  [facility];  Service: Endoscopy;  Laterality: N/A;  855am  . NISSEN FUNDOPLICATION    . PACEMAKER INSERTION     [employer]  . SHOULDER SURGERY    . SPLENECTOMY      Current Outpatient Medications  Medication Sig Dispense Refill  . albuterol (VENTOLIN HFA) 108 (90 Base) MCG/ACT inhaler Inhale 2 puffs into the lungs every 6 (six) hours as needed for wheezing or shortness of breath.    . ALPRAZolam (XANAX) 1 MG tablet Take 1 mg by mouth 2 (two) times daily.     . Ascorbic Acid (VITAMIN C) 1000 MG tablet Take 1,000 mg by mouth daily.    . cetirizine (ZYRTEC) 10 MG tablet Take 10 mg by mouth daily.    . Cholecalciferol (VITAMIN D3) 1000 units CAPS Take 1,000 Units by mouth daily.   0  . ferrous sulfate 325 (65 FE) MG tablet Take 325 mg by mouth daily with breakfast.    .  folic acid (FOLVITE) 1 MG tablet Take 1 mg by mouth daily.  1  . gabapentin (NEURONTIN) 400 MG capsule Take 400-800 mg by mouth See admin instructions. Take 1 capsule (400 mg) by mouth in the morning & take 2 capsules (800 mg) by mouth at night.    Marland Kitchen ipratropium-albuterol (DUONEB) 0.5-2.5 (3) MG/3ML SOLN Take 3 mLs by nebulization every 4 (four) hours as needed (shortness of breath).    Marland Kitchen levothyroxine (SYNTHROID) 100 MCG tablet Take 100 mcg by mouth daily before breakfast.    . Omega-3 Fatty Acids (FISH OIL) 1000 MG CAPS Take 1,000 mg by mouth daily.     Marland Kitchen omeprazole (PRILOSEC) 40 MG capsule Take 1 capsule (40 mg total) by mouth daily. 90  capsule 1  . sertraline (ZOLOFT) 50 MG tablet Take 50 mg by mouth daily.    . tizanidine (ZANAFLEX) 2 MG capsule Take 2 mg by mouth 2 (two) times daily.     . traZODone (DESYREL) 50 MG tablet Take 25 mg by mouth at bedtime.     . Vitamin A (CVS VITAMIN A) 2400 MCG (8000 UT) CAPS Take 2,400 mcg by mouth daily.    Marland Kitchen VITAMIN D PO Take by mouth daily.    . vitamin E 100 UNIT capsule Take 100 Units by mouth daily.     No current facility-administered medications for this visit.    Allergies:   Patient has no known allergies.   Social History:  The patient  reports that she quit smoking about 7 years ago. Her smoking use included cigarettes. She started smoking about 56 years ago. She has a 80.00 pack-year smoking history. She has never used smokeless tobacco. She reports that she does not drink alcohol or use drugs.   ROS:  Please see the history of present illness.   All other systems are personally reviewed and negative.    Exam:    Vital Signs:  Ht 5\' 2"  (1.575 m)   Wt 135 lb (61.2 kg)   BMI 24.69 kg/m   Well sounding, alert and conversant   Labs/Other Tests and Data Reviewed:    Recent Labs: 03/03/2019: Platelets 293 03/14/2019: Hemoglobin 11.1   Wt Readings from Last 3 Encounters:  12/04/19 135 lb (61.2 kg)  07/18/19 135 lb (61.2 kg)  03/14/19 125 lb 12.8 oz (57.1 kg)     Last device remote is reviewed from [place] PDF which reveals normal device function, no arrhythmias    ASSESSMENT & PLAN:    1.  Sick sinus syndrome Remotes are up to date Normal pacemaker function Remotes reveal 7 month battery estimated longevity Risks, benefits, and alternatives to pacemaker  pulse generator replacement were discussed in detail today.  The patient understands that risks include but are not limited to bleeding, infection, pneumothorax, perforation, tamponade, vascular damage, renal failure, MI, stroke, death,  damage to his existing leads, and lead dislodgement and wishes to proceed  once ERI.  Once she reaches ERI, ok to schedule generator change without an office visit required.  2. Tobacco Remains quite since 2014  3. covid She had covid in January.  Has done well Now vaccinated.  Follow-up:   with me in a year unless she reaches ERI prior   Patient Risk:  after full review of this patients clinical status, I feel that they are at moderate risk at this time.  Today, I have spent 15 minutes with the patient with telehealth technology discussing arrhythmia management .    Signed,  Thompson Grayer, MD  12/04/2019 10:20 AM     [facility] [address] [place] [address] [phone] (office) [phone] (fax)

## 2019-12-12 ENCOUNTER — Other Ambulatory Visit (INDEPENDENT_AMBULATORY_CARE_PROVIDER_SITE_OTHER): Payer: Self-pay | Admitting: Nurse Practitioner

## 2019-12-12 DIAGNOSIS — K219 Gastro-esophageal reflux disease without esophagitis: Secondary | ICD-10-CM

## 2019-12-14 NOTE — Telephone Encounter (Signed)
Hi Tammy, pls have Dr. Laural Golden review rx request thx

## 2019-12-14 NOTE — Telephone Encounter (Signed)
Patient to have appointment  In December 2021 per Dr.Rehman.

## 2019-12-21 DIAGNOSIS — S62357A Nondisplaced fracture of shaft of fifth metacarpal bone, left hand, initial encounter for closed fracture: Secondary | ICD-10-CM | POA: Diagnosis not present

## 2019-12-26 ENCOUNTER — Ambulatory Visit (INDEPENDENT_AMBULATORY_CARE_PROVIDER_SITE_OTHER): Payer: PPO | Admitting: *Deleted

## 2019-12-26 DIAGNOSIS — I495 Sick sinus syndrome: Secondary | ICD-10-CM

## 2019-12-29 LAB — CUP PACEART REMOTE DEVICE CHECK
Battery Remaining Longevity: 1 mo
Battery Remaining Percentage: 0.5 %
Battery Voltage: 2.62 V
Brady Statistic AP VP Percent: 1 %
Brady Statistic AP VS Percent: 56 %
Brady Statistic AS VP Percent: 1 %
Brady Statistic AS VS Percent: 43 %
Brady Statistic RA Percent Paced: 57 %
Brady Statistic RV Percent Paced: 1 %
Date Time Interrogation Session: 20210601090248
Implantable Lead Implant Date: 20100908
Implantable Lead Implant Date: 20100908
Implantable Lead Location: 753859
Implantable Lead Location: 753860
Implantable Lead Model: 1944
Implantable Lead Model: 1948
Implantable Pulse Generator Implant Date: 20100908
Lead Channel Impedance Value: 480 Ohm
Lead Channel Impedance Value: 540 Ohm
Lead Channel Pacing Threshold Amplitude: 0.5 V
Lead Channel Pacing Threshold Amplitude: 0.625 V
Lead Channel Pacing Threshold Pulse Width: 0.4 ms
Lead Channel Pacing Threshold Pulse Width: 0.5 ms
Lead Channel Sensing Intrinsic Amplitude: 3.6 mV
Lead Channel Sensing Intrinsic Amplitude: 9.1 mV
Lead Channel Setting Pacing Amplitude: 0.75 V
Lead Channel Setting Pacing Amplitude: 1.625
Lead Channel Setting Pacing Pulse Width: 0.4 ms
Lead Channel Setting Sensing Sensitivity: 3 mV
Pulse Gen Model: 2110
Pulse Gen Serial Number: 2300232

## 2020-01-01 DIAGNOSIS — Z6824 Body mass index (BMI) 24.0-24.9, adult: Secondary | ICD-10-CM | POA: Diagnosis not present

## 2020-01-01 DIAGNOSIS — F418 Other specified anxiety disorders: Secondary | ICD-10-CM | POA: Diagnosis not present

## 2020-01-01 DIAGNOSIS — K219 Gastro-esophageal reflux disease without esophagitis: Secondary | ICD-10-CM | POA: Diagnosis not present

## 2020-01-01 DIAGNOSIS — J449 Chronic obstructive pulmonary disease, unspecified: Secondary | ICD-10-CM | POA: Diagnosis not present

## 2020-01-01 DIAGNOSIS — J301 Allergic rhinitis due to pollen: Secondary | ICD-10-CM | POA: Diagnosis not present

## 2020-01-01 DIAGNOSIS — E038 Other specified hypothyroidism: Secondary | ICD-10-CM | POA: Diagnosis not present

## 2020-01-01 NOTE — Progress Notes (Signed)
Remote pacemaker transmission.   

## 2020-01-09 DIAGNOSIS — S62357D Nondisplaced fracture of shaft of fifth metacarpal bone, left hand, subsequent encounter for fracture with routine healing: Secondary | ICD-10-CM | POA: Diagnosis not present

## 2020-01-15 NOTE — Progress Notes (Signed)
 [facility]   HPI:  Patient is a pleasant 74 year old female patient with a left fifth metacarpal neck fracture.  Approximate date of injury November 21, 2019.  Patient treated with ulnar gutter splinting.  Patient is here to check a laceration between the fingers secondary to prolonged splinting.  No complaints of pain.  Patient taking off the splint around her home at this time Past Medical History:  Diagnosis Date  . Anemia   . COPD (chronic obstructive pulmonary disease) (CMS-HCC)   . Depression   . GERD (gastroesophageal reflux disease)   . Heart disease   . Hypertension   . Mitral and aortic regurgitation   . Peptic ulcer   . Syncope     Past Surgical History:  Procedure Laterality Date  . APPENDECTOMY    . ATRIAL CARDIAC PACEMAKER INSERTION    . CARPAL TUNNEL RELEASE    . CHOLECYSTECTOMY    . COLONOSCOPY    . ESOPHAGOGASTRODUODENOSCOPY    . NISSEN FUNDOPLICATION    . SHOULDER SURGERY    . SPLENECTOMY      History reviewed. No pertinent family history.  Social History   Socioeconomic History  . Marital status: Married    Spouse name: Not on file  . Number of children: Not on file  . Years of education: Not on file  . Highest education level: Not on file  Occupational History  . Not on file  Tobacco Use  . Smoking status: Former Games developer  . Smokeless tobacco: Never Used  Substance and Sexual Activity  . Alcohol use: Not on file  . Drug use: Not on file  . Sexual activity: Not on file  Other Topics Concern  . Not on file  Social History Narrative  . Not on file   Social Determinants of Health   Financial Resource Strain:   . Difficulty of Paying Living Expenses:   Food Insecurity:   . Worried About Programme researcher, broadcasting/film/video in the Last Year:   . Barista in the Last Year:   Transportation Needs:   . Freight forwarder (Medical):   SABRA Lack of Transportation (Non-Medical):   Physical Activity:   . Days of Exercise per  Week:   . Minutes of Exercise per Session:   Stress:   . Feeling of Stress :   Social Connections:   . Frequency of Communication with Friends and Family:   . Frequency of Social Gatherings with Friends and Family:   . Attends Religious Services:   . Active Member of Clubs or Organizations:   . Attends Banker Meetings:   SABRA Marital Status:     I have reviewed past medical, surgical, social and family history, medications and allergies as documented in the EMR.  In the last three years the patient reports experiencing the following: Joint Pain  Review of Systems:  Constitutional: Denies fever, chills, fatigue weight changes.  Neuro: Alert & Oriented x 3, denies  visual changes, vertigo, confusion ENT: Denies vision changes, hearing loss, nasal drainage, sore thorat Cardiovascular: Denies, chest pain, dyspnea on exertion, edema, irregular heartbeat, palpitations or rapid heart rate Respiratory: Denies cough, shortness of breath on exertion, or wheezing GI: Denies  abdominal pain, appetite loss,  Or hematemesis GU: Denies dysuria, hematuria or incontinence Musculoskeletal:  Denies gait disturbance, joint pain, joint stiffness, joint swelling or muscle pain Skin: Denies pruritus and rash, skin lesions Psych:  Denies depression, hallucinations or mood swings Allergy:  Denies latex allergy, hives, watery eyes, food allergies Hemeatological: Denies blood transfusions, bruising, fatigue, jaundice or swollen lymph nodes Endocrine: Denies heat/cold intolerance, malaise/lethargy, mood swings, palpitations or skin changes    General Exam:  General/Constitutional: No apparent distress: well-nourished and well developed. ENT: Pupils equal, round with synchronous movement. Respiratory: Non-labored breathing, normal inspiratory effort Cardiac: regular rate and rhythm. GI: soft, non tender. No masses.   Vascular: No edema, swelling or tenderness, except as noted in detailed  exam. Integumentary: No impressive skin lesions present, except as noted in detailed exam. Neuro/Psych: Normal mood and affect, oriented to person, place and time. Musculoskeletal: Normal, except as noted in detailed exam and in HPI.  Physical exam: Examination left hand: Mild swelling over the fracture site fifth metacarpal neck Straight clinical appearance No clawing of the fifth digit Patient can make a closed fist easily Full active extension MCP fifth finger FDP FDS intact all fingers Healed maceration between the third and fourth fingers in the web space without signs of infection.   Review of prior x-rays reveal a healing fifth metacarpal neck fracture in acceptable alignment.   Diagnoses/Treatment Plans:  Problem List Items Addressed This Visit      Musculoskeletal and Integument   Closed displaced fracture of shaft of fifth metacarpal bone of left hand - Primary    Treatment: 1.  Protective ulnar gutter splinting outside the home only for 2 more weeks then discontinue  2.  Follow-up on a as needed basis             ICD-10-CM  1. Closed displaced fracture of shaft of fifth metacarpal bone of left hand with routine healing, subsequent encounter  S62.327D

## 2020-01-25 ENCOUNTER — Ambulatory Visit (INDEPENDENT_AMBULATORY_CARE_PROVIDER_SITE_OTHER): Payer: PPO | Admitting: *Deleted

## 2020-01-25 DIAGNOSIS — I495 Sick sinus syndrome: Secondary | ICD-10-CM

## 2020-01-25 LAB — CUP PACEART REMOTE DEVICE CHECK
Battery Remaining Longevity: 1 mo
Battery Remaining Percentage: 0.5 %
Battery Voltage: 2.6 V
Brady Statistic AP VP Percent: 1 %
Brady Statistic AP VS Percent: 55 %
Brady Statistic AS VP Percent: 1 %
Brady Statistic AS VS Percent: 44 %
Brady Statistic RA Percent Paced: 55 %
Brady Statistic RV Percent Paced: 1 %
Date Time Interrogation Session: 20210701095313
Implantable Lead Implant Date: 20100908
Implantable Lead Implant Date: 20100908
Implantable Lead Location: 753859
Implantable Lead Location: 753860
Implantable Lead Model: 1944
Implantable Lead Model: 1948
Implantable Pulse Generator Implant Date: 20100908
Lead Channel Impedance Value: 460 Ohm
Lead Channel Impedance Value: 490 Ohm
Lead Channel Pacing Threshold Amplitude: 0.5 V
Lead Channel Pacing Threshold Amplitude: 0.625 V
Lead Channel Pacing Threshold Pulse Width: 0.4 ms
Lead Channel Pacing Threshold Pulse Width: 0.5 ms
Lead Channel Sensing Intrinsic Amplitude: 3.7 mV
Lead Channel Sensing Intrinsic Amplitude: 8.3 mV
Lead Channel Setting Pacing Amplitude: 0.875
Lead Channel Setting Pacing Amplitude: 1.5 V
Lead Channel Setting Pacing Pulse Width: 0.4 ms
Lead Channel Setting Sensing Sensitivity: 3 mV
Pulse Gen Model: 2110
Pulse Gen Serial Number: 2300232

## 2020-01-26 NOTE — Progress Notes (Signed)
Remote pacemaker transmission.   

## 2020-02-26 ENCOUNTER — Ambulatory Visit (INDEPENDENT_AMBULATORY_CARE_PROVIDER_SITE_OTHER): Payer: PPO | Admitting: *Deleted

## 2020-02-26 DIAGNOSIS — I495 Sick sinus syndrome: Secondary | ICD-10-CM

## 2020-02-27 LAB — CUP PACEART REMOTE DEVICE CHECK
Battery Remaining Longevity: 1 mo
Battery Remaining Percentage: 0.5 %
Battery Voltage: 2.59 V
Brady Statistic AP VP Percent: 1 %
Brady Statistic AP VS Percent: 54 %
Brady Statistic AS VP Percent: 1 %
Brady Statistic AS VS Percent: 45 %
Brady Statistic RA Percent Paced: 54 %
Brady Statistic RV Percent Paced: 1 %
Date Time Interrogation Session: 20210802100024
Implantable Lead Implant Date: 20100908
Implantable Lead Implant Date: 20100908
Implantable Lead Location: 753859
Implantable Lead Location: 753860
Implantable Lead Model: 1944
Implantable Lead Model: 1948
Implantable Pulse Generator Implant Date: 20100908
Lead Channel Impedance Value: 550 Ohm
Lead Channel Impedance Value: 580 Ohm
Lead Channel Pacing Threshold Amplitude: 0.625 V
Lead Channel Pacing Threshold Amplitude: 0.625 V
Lead Channel Pacing Threshold Pulse Width: 0.4 ms
Lead Channel Pacing Threshold Pulse Width: 0.5 ms
Lead Channel Sensing Intrinsic Amplitude: 10.9 mV
Lead Channel Sensing Intrinsic Amplitude: 4 mV
Lead Channel Setting Pacing Amplitude: 0.875
Lead Channel Setting Pacing Amplitude: 1.625
Lead Channel Setting Pacing Pulse Width: 0.4 ms
Lead Channel Setting Sensing Sensitivity: 3 mV
Pulse Gen Model: 2110
Pulse Gen Serial Number: 2300232

## 2020-02-29 NOTE — Progress Notes (Signed)
Remote pacemaker transmission.   

## 2020-02-29 NOTE — Addendum Note (Signed)
Addended by: Douglass Rivers D on: 02/29/2020 01:11 PM   Modules accepted: Level of Service

## 2020-03-04 DIAGNOSIS — E038 Other specified hypothyroidism: Secondary | ICD-10-CM | POA: Diagnosis not present

## 2020-03-04 DIAGNOSIS — K219 Gastro-esophageal reflux disease without esophagitis: Secondary | ICD-10-CM | POA: Diagnosis not present

## 2020-03-04 DIAGNOSIS — F418 Other specified anxiety disorders: Secondary | ICD-10-CM | POA: Diagnosis not present

## 2020-03-04 DIAGNOSIS — J449 Chronic obstructive pulmonary disease, unspecified: Secondary | ICD-10-CM | POA: Diagnosis not present

## 2020-03-04 DIAGNOSIS — Z6823 Body mass index (BMI) 23.0-23.9, adult: Secondary | ICD-10-CM | POA: Diagnosis not present

## 2020-03-04 DIAGNOSIS — J301 Allergic rhinitis due to pollen: Secondary | ICD-10-CM | POA: Diagnosis not present

## 2020-03-29 ENCOUNTER — Telehealth: Payer: Self-pay | Admitting: *Deleted

## 2020-03-29 DIAGNOSIS — I495 Sick sinus syndrome: Secondary | ICD-10-CM

## 2020-03-29 DIAGNOSIS — Z950 Presence of cardiac pacemaker: Secondary | ICD-10-CM

## 2020-03-29 NOTE — Telephone Encounter (Signed)
Remote device check reviewed.  Device report notable for: ERI by voltage. [name], lets go ahead and schedule generator change. Not urgent.  Per MD Thompson Grayer   Left voicemail for her to call the office

## 2020-04-02 ENCOUNTER — Encounter: Payer: Self-pay | Admitting: *Deleted

## 2020-04-02 NOTE — Telephone Encounter (Signed)
Spoke with the patient and schedule PPM gen change, labs and covid testing. Will meet with the patient on lab day to go over letters, answer questions, and give CHG scrub.

## 2020-04-02 NOTE — Telephone Encounter (Signed)
Chancie is returning Tina's call.

## 2020-04-02 NOTE — Telephone Encounter (Signed)
Left message to call back  

## 2020-04-02 NOTE — Addendum Note (Signed)
Addended by: Darrell Jewel on: 04/02/2020 04:37 PM   Modules accepted: Orders

## 2020-04-05 ENCOUNTER — Other Ambulatory Visit: Payer: Self-pay

## 2020-04-05 ENCOUNTER — Other Ambulatory Visit: Payer: PPO | Admitting: *Deleted

## 2020-04-05 ENCOUNTER — Telehealth: Payer: Self-pay | Admitting: Internal Medicine

## 2020-04-05 DIAGNOSIS — Z950 Presence of cardiac pacemaker: Secondary | ICD-10-CM

## 2020-04-05 DIAGNOSIS — I495 Sick sinus syndrome: Secondary | ICD-10-CM

## 2020-04-05 NOTE — Telephone Encounter (Signed)
Patient wanting assistance with finding the office today for lab work. Rechecked that she had the address correct and give her some sites to look for when arriving.

## 2020-04-05 NOTE — Telephone Encounter (Signed)
    Pt would like to speak with RN Otila Kluver

## 2020-04-06 LAB — CBC WITH DIFFERENTIAL/PLATELET
Basophils Absolute: 0.1 10*3/uL (ref 0.0–0.2)
Basos: 1 %
EOS (ABSOLUTE): 0.3 10*3/uL (ref 0.0–0.4)
Eos: 5 %
Hematocrit: 33 % — ABNORMAL LOW (ref 34.0–46.6)
Hemoglobin: 10.6 g/dL — ABNORMAL LOW (ref 11.1–15.9)
Immature Grans (Abs): 0 10*3/uL (ref 0.0–0.1)
Immature Granulocytes: 0 %
Lymphocytes Absolute: 2.1 10*3/uL (ref 0.7–3.1)
Lymphs: 35 %
MCH: 30.5 pg (ref 26.6–33.0)
MCHC: 32.1 g/dL (ref 31.5–35.7)
MCV: 95 fL (ref 79–97)
Monocytes Absolute: 1 10*3/uL — ABNORMAL HIGH (ref 0.1–0.9)
Monocytes: 16 %
Neutrophils Absolute: 2.6 10*3/uL (ref 1.4–7.0)
Neutrophils: 43 %
Platelets: 231 10*3/uL (ref 150–450)
RBC: 3.47 x10E6/uL — ABNORMAL LOW (ref 3.77–5.28)
RDW: 12.1 % (ref 11.7–15.4)
WBC: 6.1 10*3/uL (ref 3.4–10.8)

## 2020-04-06 LAB — BASIC METABOLIC PANEL
BUN/Creatinine Ratio: 13 (ref 12–28)
BUN: 13 mg/dL (ref 8–27)
CO2: 26 mmol/L (ref 20–29)
Calcium: 8.8 mg/dL (ref 8.7–10.3)
Chloride: 102 mmol/L (ref 96–106)
Creatinine, Ser: 0.97 mg/dL (ref 0.57–1.00)
GFR calc Af Amer: 67 mL/min/{1.73_m2} (ref >59)
GFR calc non Af Amer: 58 mL/min/{1.73_m2} — ABNORMAL LOW (ref >59)
Glucose: 137 mg/dL — ABNORMAL HIGH (ref 65–99)
Potassium: 4.2 mmol/L (ref 3.5–5.2)
Sodium: 140 mmol/L (ref 134–144)

## 2020-04-08 ENCOUNTER — Other Ambulatory Visit (HOSPITAL_COMMUNITY)
Admission: RE | Admit: 2020-04-08 | Discharge: 2020-04-08 | Disposition: A | Discharge status: Home / Self Care | Payer: PPO | Source: Ambulatory Visit | Attending: Internal Medicine | Admitting: Internal Medicine

## 2020-04-08 DIAGNOSIS — Z01812 Encounter for preprocedural laboratory examination: Secondary | ICD-10-CM | POA: Diagnosis not present

## 2020-04-08 DIAGNOSIS — Z20822 Contact with and (suspected) exposure to covid-19: Secondary | ICD-10-CM | POA: Diagnosis not present

## 2020-04-08 LAB — SARS CORONAVIRUS 2 (TAT 6-24 HRS): SARS Coronavirus 2: NEGATIVE

## 2020-04-08 NOTE — Progress Notes (Signed)
Instructed patient on the following items: Arrival time 1130 Nothing to eat or drink after midnight No meds AM of procedure Responsible person to drive you home and stay with you for 24 hrs Wash with special soap night before and morning of procedure  

## 2020-04-09 ENCOUNTER — Encounter (HOSPITAL_COMMUNITY): Payer: Self-pay | Admitting: Internal Medicine

## 2020-04-09 ENCOUNTER — Other Ambulatory Visit: Payer: Self-pay

## 2020-04-09 ENCOUNTER — Ambulatory Visit (HOSPITAL_COMMUNITY)
Admission: RE | Admit: 2020-04-09 | Discharge: 2020-04-09 | Disposition: A | Discharge status: Home / Self Care | Payer: PPO | Attending: Internal Medicine | Admitting: Internal Medicine

## 2020-04-09 ENCOUNTER — Ambulatory Visit (HOSPITAL_COMMUNITY)
Admission: RE | Disposition: A | Discharge status: Home / Self Care | Payer: PPO | Source: Home / Self Care | Attending: Internal Medicine

## 2020-04-09 DIAGNOSIS — F329 Major depressive disorder, single episode, unspecified: Secondary | ICD-10-CM | POA: Insufficient documentation

## 2020-04-09 DIAGNOSIS — Z87891 Personal history of nicotine dependence: Secondary | ICD-10-CM | POA: Diagnosis not present

## 2020-04-09 DIAGNOSIS — Z79890 Hormone replacement therapy (postmenopausal): Secondary | ICD-10-CM | POA: Insufficient documentation

## 2020-04-09 DIAGNOSIS — I10 Essential (primary) hypertension: Secondary | ICD-10-CM | POA: Diagnosis not present

## 2020-04-09 DIAGNOSIS — Z79899 Other long term (current) drug therapy: Secondary | ICD-10-CM | POA: Insufficient documentation

## 2020-04-09 DIAGNOSIS — J449 Chronic obstructive pulmonary disease, unspecified: Secondary | ICD-10-CM | POA: Diagnosis not present

## 2020-04-09 DIAGNOSIS — Z45010 Encounter for checking and testing of cardiac pacemaker pulse generator [battery]: Secondary | ICD-10-CM | POA: Insufficient documentation

## 2020-04-09 DIAGNOSIS — K219 Gastro-esophageal reflux disease without esophagitis: Secondary | ICD-10-CM | POA: Diagnosis not present

## 2020-04-09 DIAGNOSIS — I495 Sick sinus syndrome: Secondary | ICD-10-CM | POA: Diagnosis not present

## 2020-04-09 DIAGNOSIS — G8929 Other chronic pain: Secondary | ICD-10-CM | POA: Diagnosis not present

## 2020-04-09 HISTORY — PX: PPM GENERATOR CHANGEOUT: EP1233

## 2020-04-09 SURGERY — PPM GENERATOR CHANGEOUT

## 2020-04-09 MED ORDER — FENTANYL CITRATE (PF) 100 MCG/2ML IJ SOLN
INTRAMUSCULAR | Status: AC
  Filled 2020-04-09: qty 2

## 2020-04-09 MED ORDER — CEFAZOLIN SODIUM-DEXTROSE 2-4 GM/100ML-% IV SOLN
2.0000 g | INTRAVENOUS | Status: AC
  Administered 2020-04-09: 2 g via INTRAVENOUS

## 2020-04-09 MED ORDER — ACETAMINOPHEN 325 MG PO TABS
325.0000 mg | ORAL_TABLET | ORAL | Status: DC | PRN
  Filled 2020-04-09: qty 2

## 2020-04-09 MED ORDER — LIDOCAINE HCL (PF) 1 % IJ SOLN
INTRAMUSCULAR | Status: DC | PRN
  Administered 2020-04-09: 50 mL

## 2020-04-09 MED ORDER — SODIUM CHLORIDE 0.9 % IV SOLN
INTRAVENOUS | Status: AC
  Filled 2020-04-09: qty 2

## 2020-04-09 MED ORDER — SODIUM CHLORIDE 0.9 % IV SOLN
80.0000 mg | INTRAVENOUS | Status: AC
  Administered 2020-04-09: 80 mg

## 2020-04-09 MED ORDER — SODIUM CHLORIDE 0.9% FLUSH: 3.0000 mL | Freq: Two times a day (BID) | INTRAVENOUS | Status: DC

## 2020-04-09 MED ORDER — LIDOCAINE HCL 1 % IJ SOLN
INTRAMUSCULAR | Status: AC
  Filled 2020-04-09: qty 60

## 2020-04-09 MED ORDER — FENTANYL CITRATE (PF) 100 MCG/2ML IJ SOLN
INTRAMUSCULAR | Status: DC | PRN
Start: 2020-04-09 — End: 2020-04-09
  Administered 2020-04-09: 12.5 ug via INTRAVENOUS
  Administered 2020-04-09: 25 ug via INTRAVENOUS

## 2020-04-09 MED ORDER — SODIUM CHLORIDE 0.9 % IV SOLN: INTRAVENOUS | Status: DC

## 2020-04-09 MED ORDER — MIDAZOLAM HCL 5 MG/5ML IJ SOLN
INTRAMUSCULAR | Status: DC | PRN
  Administered 2020-04-09: 1 mg via INTRAVENOUS
  Administered 2020-04-09: 2 mg via INTRAVENOUS
  Administered 2020-04-09: 1 mg via INTRAVENOUS

## 2020-04-09 MED ORDER — SODIUM CHLORIDE 0.9 % IV SOLN: 250.0000 mL | INTRAVENOUS | Status: DC | PRN

## 2020-04-09 MED ORDER — SODIUM CHLORIDE 0.9% FLUSH: 3.0000 mL | INTRAVENOUS | Status: DC | PRN

## 2020-04-09 MED ORDER — ONDANSETRON HCL 4 MG/2ML IJ SOLN: 4.0000 mg | Freq: Four times a day (QID) | INTRAMUSCULAR | Status: DC | PRN

## 2020-04-09 MED ORDER — CHLORHEXIDINE GLUCONATE 4 % EX LIQD: 4.0000 "application " | Freq: Once | CUTANEOUS | Status: DC

## 2020-04-09 MED ORDER — MIDAZOLAM HCL 5 MG/5ML IJ SOLN
INTRAMUSCULAR | Status: AC
  Filled 2020-04-09: qty 5

## 2020-04-09 MED ORDER — CEFAZOLIN SODIUM-DEXTROSE 2-4 GM/100ML-% IV SOLN
INTRAVENOUS | Status: AC
  Filled 2020-04-09: qty 100

## 2020-04-09 SURGICAL SUPPLY — 4 items
CABLE SURGICAL S-101-97-12 (CABLE) ×3 IMPLANT
PACEMAKER ASSURITY DR-RF (Pacemaker) ×2 IMPLANT
PAD PRO RADIOLUCENT 2001M-C (PAD) ×3 IMPLANT
TRAY PACEMAKER INSERTION (PACKS) ×3 IMPLANT

## 2020-04-09 NOTE — H&P (Signed)
Chief Complaint:  palpitations  History of Present Illness:    [name] is a 74 y.o. female who presents for pacemaker generator change.  Since last being seen in our clinic, the patient reports doing very well.   Doing well.   Today, she denies symptoms of palpitations, chest pain, shortness of breath,  lower extremity edema, dizziness, presyncope, or syncope.  The patient is otherwise without complaint today.         Past Medical History:  Diagnosis Date  . Chronic pain   . COPD with emphysema ([place])   . Depression   . GERD (gastroesophageal reflux disease)   . Hypertension   . Mitral and aortic regurgitation    Mild  . Neurodermatitis   . Peptic ulcer   . Sick sinus syndrome (HCC)    s/p PPM (SJM)  . Syncope          Past Surgical History:  Procedure Laterality Date  . APPENDECTOMY    . BIOPSY  05/12/2019   Procedure: BIOPSY;  Surgeon: Rogene Houston, MD;  [facility];  Service: Endoscopy;;  antral, pre-pyloric mucosa  . CARPAL TUNNEL RELEASE    . CHOLECYSTECTOMY    . COLONOSCOPY WITH PROPOFOL N/A 03/03/2019   Procedure: COLONOSCOPY WITH PROPOFOL;  Surgeon: Rogene Houston, MD;  [facility];  Service: Endoscopy;  Laterality: N/A;  9:50  . ESOPHAGEAL DILATION N/A 01/15/2017   Procedure: ESOPHAGEAL DILATION;  Surgeon: Rogene Houston, MD;  [facility];  Service: Endoscopy;  Laterality: N/A;  . ESOPHAGOGASTRODUODENOSCOPY (EGD) WITH PROPOFOL N/A 01/15/2017   Procedure: ESOPHAGOGASTRODUODENOSCOPY (EGD) WITH PROPOFOL;  Surgeon: Rogene Houston, MD;  [facility];  Service: Endoscopy;  Laterality: N/A;  9:15  . ESOPHAGOGASTRODUODENOSCOPY (EGD) WITH PROPOFOL N/A 05/12/2019   Procedure: ESOPHAGOGASTRODUODENOSCOPY (EGD) WITH PROPOFOL;  Surgeon: Rogene Houston, MD;  [facility];  Service: Endoscopy;  Laterality: N/A;  855am  . NISSEN FUNDOPLICATION    . PACEMAKER INSERTION     St Jude   . SHOULDER SURGERY    . SPLENECTOMY            Current Outpatient Medications  Medication Sig Dispense Refill  . albuterol (VENTOLIN HFA) 108 (90 Base) MCG/ACT inhaler Inhale 2 puffs into the lungs every 6 (six) hours as needed for wheezing or shortness of breath.    . ALPRAZolam (XANAX) 1 MG tablet Take 1 mg by mouth 2 (two) times daily.     . Ascorbic Acid (VITAMIN C) 1000 MG tablet Take 1,000 mg by mouth daily.    . cetirizine (ZYRTEC) 10 MG tablet Take 10 mg by mouth daily.    . Cholecalciferol (VITAMIN D3) 1000 units CAPS Take 1,000 Units by mouth daily.   0  . ferrous sulfate 325 (65 FE) MG tablet Take 325 mg by mouth daily with breakfast.    . folic acid (FOLVITE) 1 MG tablet Take 1 mg by mouth daily.  1  . gabapentin (NEURONTIN) 400 MG capsule Take 400-800 mg by mouth See admin instructions. Take 1 capsule (400 mg) by mouth in the morning & take 2 capsules (800 mg) by mouth at night.    Marland Kitchen ipratropium-albuterol (DUONEB) 0.5-2.5 (3) MG/3ML SOLN Take 3 mLs by nebulization every 4 (four) hours as needed (shortness of breath).    Marland Kitchen levothyroxine (SYNTHROID) 100 MCG tablet Take 100 mcg by mouth daily before breakfast.    . Omega-3 Fatty Acids (FISH OIL) 1000 MG  CAPS Take 1,000 mg by mouth daily.     Marland Kitchen omeprazole (PRILOSEC) 40 MG capsule Take 1 capsule (40 mg total) by mouth daily. 90 capsule 1  . sertraline (ZOLOFT) 50 MG tablet Take 50 mg by mouth daily.    . tizanidine (ZANAFLEX) 2 MG capsule Take 2 mg by mouth 2 (two) times daily.     . traZODone (DESYREL) 50 MG tablet Take 25 mg by mouth at bedtime.     . Vitamin A (CVS VITAMIN A) 2400 MCG (8000 UT) CAPS Take 2,400 mcg by mouth daily.    Marland Kitchen VITAMIN D PO Take by mouth daily.    . vitamin E 100 UNIT capsule Take 100 Units by mouth daily.     No current facility-administered medications for this visit.    Allergies:   Patient has no known allergies.   Social History:  The patient  reports  that she quit smoking about 7 years ago. Her smoking use included cigarettes. She started smoking about 56 years ago. She has a 80.00 pack-year smoking history. She has never used smokeless tobacco. She reports that she does not drink alcohol or use drugs.   ROS:  Please see the history of present illness.   All other systems are personally reviewed and negative.   Physical Exam: Vitals:   04/09/20 1154  BP: (!) 161/59  Pulse: 72  Resp: 14  Temp: 98.2 F (36.8 C)  TempSrc: Oral  SpO2: 95%  Weight: 56.7 kg  Height: 5\' 2"  (1.575 m)    GEN- The patient is well appearing, alert and oriented x 3 today.   Head- normocephalic, atraumatic Eyes-  Sclera clear, conjunctiva pink Ears- hearing intact Oropharynx- clear Neck- supple,  Lungs-   normal work of breathing Chest- pacemaker pocket is without hematoma/ bruit Heart- Regular rate and rhythm  GI- soft  Extremities- no clubbing, cyanosis, or edema Neuro- strength and sensation are intact   Labs/Other Tests and Data Reviewed:    Recent Labs: 03/03/2019: Platelets 293 03/14/2019: Hemoglobin 11.1      Wt Readings from Last 3 Encounters:  12/04/19 135 lb (61.2 kg)  07/18/19 135 lb (61.2 kg)  03/14/19 125 lb 12.8 oz (57.1 kg)      ASSESSMENT & PLAN:    1.  Sick sinus syndrome She has reached ERI. Risks, benefits, and alternatives to pacemaker pulse generator replacement were discussed in detail today.  The patient understands that risks include but are not limited to bleeding, infection, pneumothorax, perforation, tamponade, vascular damage, renal failure, MI, stroke, death, damage to his existing leads, and lead dislodgement and wishes to proceed.  Thompson Grayer MD, [place] 04/09/2020 2:14 PM

## 2020-04-09 NOTE — Progress Notes (Signed)
Patient was given discharge instructions. She verbalized understanding. 

## 2020-04-09 NOTE — Discharge Instructions (Signed)

## 2020-04-10 MED FILL — Lidocaine HCl Local Inj 1%: INTRAMUSCULAR | Qty: 60 | Status: AC

## 2020-04-11 ENCOUNTER — Other Ambulatory Visit: Payer: PPO

## 2020-04-22 ENCOUNTER — Telehealth: Payer: Self-pay | Admitting: Internal Medicine

## 2020-04-22 NOTE — Telephone Encounter (Signed)
Attempted to callback, no answer, call goes straight to VM.  Left message requesting callback.

## 2020-04-22 NOTE — Telephone Encounter (Signed)
Patient's husband called and stated that they need to speak with Dr. Rayann Heman or nurse. Just had pacemaker put in a couple weeks ago.

## 2020-04-23 ENCOUNTER — Ambulatory Visit (INDEPENDENT_AMBULATORY_CARE_PROVIDER_SITE_OTHER): Payer: PPO | Admitting: Emergency Medicine

## 2020-04-23 ENCOUNTER — Other Ambulatory Visit: Payer: Self-pay

## 2020-04-23 DIAGNOSIS — Z950 Presence of cardiac pacemaker: Secondary | ICD-10-CM | POA: Diagnosis not present

## 2020-04-23 DIAGNOSIS — I495 Sick sinus syndrome: Secondary | ICD-10-CM

## 2020-04-23 LAB — CUP PACEART INCLINIC DEVICE CHECK
Battery Remaining Longevity: 134 mo
Battery Voltage: 3.14 V
Brady Statistic RA Percent Paced: 50 %
Brady Statistic RV Percent Paced: 0.15 %
Date Time Interrogation Session: 20210928113943
Implantable Lead Implant Date: 20100908
Implantable Lead Implant Date: 20100908
Implantable Lead Location: 753859
Implantable Lead Location: 753860
Implantable Lead Model: 1944
Implantable Lead Model: 1948
Implantable Pulse Generator Implant Date: 20210914
Lead Channel Impedance Value: 462.5 Ohm
Lead Channel Impedance Value: 525 Ohm
Lead Channel Pacing Threshold Amplitude: 0.5 V
Lead Channel Pacing Threshold Amplitude: 0.625 V
Lead Channel Pacing Threshold Pulse Width: 0.4 ms
Lead Channel Pacing Threshold Pulse Width: 0.5 ms
Lead Channel Sensing Intrinsic Amplitude: 3.1 mV
Lead Channel Sensing Intrinsic Amplitude: 9.9 mV
Lead Channel Setting Pacing Amplitude: 0.75 V
Lead Channel Setting Pacing Amplitude: 1.625
Lead Channel Setting Pacing Pulse Width: 0.4 ms
Lead Channel Setting Sensing Sensitivity: 4 mV
Pulse Gen Model: 2272
Pulse Gen Serial Number: 3863879

## 2020-04-23 NOTE — Progress Notes (Signed)
Wound check appointment. Steri-strips removed. Wound without redness or edema. Incision edges approximated, wound well healed. Normal device function. Thresholds, sensing, and impedances consistent with implant measurements. Device programmed at chronic settings due to mature leads.Delman Cheadle distribution appropriate for patient and level of activity. No mode switches or high ventricular rates noted. Patient educated about wound care. Enrolled in remote follow-up and next remote scheduled for 07/09/20. Follow-up with DR Allred 07/12/20.

## 2020-04-24 NOTE — Telephone Encounter (Signed)
Patient seen in [place] clinic on 04/23/20.

## 2020-05-06 ENCOUNTER — Ambulatory Visit: Payer: PPO | Admitting: Cardiology

## 2020-05-08 ENCOUNTER — Ambulatory Visit (INDEPENDENT_AMBULATORY_CARE_PROVIDER_SITE_OTHER): Payer: PPO

## 2020-05-08 DIAGNOSIS — R011 Cardiac murmur, unspecified: Secondary | ICD-10-CM

## 2020-05-08 LAB — ECHOCARDIOGRAM COMPLETE
Area-P 1/2: 2.79 cm2
Calc EF: 65.2 %
MV M vel: 1.89 m/s
MV Peak grad: 14.3 mmHg
P 1/2 time: 568 msec
S' Lateral: 2.81 cm
Single Plane A2C EF: 75.2 %
Single Plane A4C EF: 56.9 %

## 2020-05-12 NOTE — Progress Notes (Signed)
Cardiology Office Note  Date: 05/13/2020   ID: [name], [name] 02-Nov-1945, MRN [id number]  PCP:  Neale Burly, MD  Cardiologist:  Rozann Lesches, MD Electrophysiologist:  Thompson Grayer, MD   Chief Complaint: Follow-up status post pacemaker generator replacement SSS  History of Present Illness: [name] is a 74 y.o. female with a history of sick sinus syndrome status post PPM (SJM) Dr. Rayann Heman, syncope, mitral and aortic regurgitation, HTN, GERD, COPD, chronic pain.  Patient is here for 1 year follow-up.  States she has been doing well.  Recently had a pacemaker generator replacement by Dr. Rayann Heman.  Doing well since replacement.  Pacemaker generator insertion site is clean and dry without redness or swelling.  EKG today shows heart rate of 89 sinus rhythm.  Denies any anginal or exertional symptoms, palpitations or arrhythmias, orthostatic symptoms i.e. lightheadedness, dizziness, presyncope or syncopal episode.  Denies any PND orthopnea.  States she sleeps in a recliner due to back issues.  States she has been having some abdominal pain.  She has had a previous Nissen fundoplication.  She recently had an EGD and a colonoscopy.  EGD was done 05/12/2019 for epigastric abdominal pain and history of esophagitis and gastric ulcers.  Esophagus was normal.  She had a 2 cm hiatal hernia.  Diffuse mild inflammation characterized by congestion/edema and granularity found in the gastric antrum.  Biopsies were taken with cold forceps.  Evidence of gastritis.  Erythematous and flattened mucosa seen on previous pyloric region of the stomach.  Niesen fundoplication was found.  The wrap appeared loose.  Colonoscopy showed diverticulosis in the sigmoid colon and the descending colon and transverse colon.  External hemorrhoids.  Still complains of pain in the epigastric region mostly when arising in the morning.   Past Medical History:  Diagnosis Date  . Chronic pain   . COPD with emphysema ([place])     . Depression   . GERD (gastroesophageal reflux disease)   . Hypertension   . Mitral and aortic regurgitation    Mild  . Neurodermatitis   . Peptic ulcer   . Sick sinus syndrome (HCC)    s/p PPM (SJM)  . Syncope     Past Surgical History:  Procedure Laterality Date  . APPENDECTOMY    . BIOPSY  05/12/2019   Procedure: BIOPSY;  Surgeon: Rogene Houston, MD;  [facility];  Service: Endoscopy;;  antral, pre-pyloric mucosa  . CARPAL TUNNEL RELEASE    . CHOLECYSTECTOMY    . COLONOSCOPY WITH PROPOFOL N/A 03/03/2019   Procedure: COLONOSCOPY WITH PROPOFOL;  Surgeon: Rogene Houston, MD;  [facility];  Service: Endoscopy;  Laterality: N/A;  9:50  . ESOPHAGEAL DILATION N/A 01/15/2017   Procedure: ESOPHAGEAL DILATION;  Surgeon: Rogene Houston, MD;  [facility];  Service: Endoscopy;  Laterality: N/A;  . ESOPHAGOGASTRODUODENOSCOPY (EGD) WITH PROPOFOL N/A 01/15/2017   Procedure: ESOPHAGOGASTRODUODENOSCOPY (EGD) WITH PROPOFOL;  Surgeon: Rogene Houston, MD;  [facility];  Service: Endoscopy;  Laterality: N/A;  9:15  . ESOPHAGOGASTRODUODENOSCOPY (EGD) WITH PROPOFOL N/A 05/12/2019   Procedure: ESOPHAGOGASTRODUODENOSCOPY (EGD) WITH PROPOFOL;  Surgeon: Rogene Houston, MD;  [facility];  Service: Endoscopy;  Laterality: N/A;  855am  . NISSEN FUNDOPLICATION    . PACEMAKER INSERTION     St Jude  . PPM GENERATOR CHANGEOUT N/A 04/09/2020   Procedure: PPM GENERATOR CHANGEOUT;  Surgeon: Thompson Grayer, MD;  Location: [facility];  Service:  Cardiovascular;  Laterality: N/A;  . SHOULDER SURGERY    . SPLENECTOMY      Current Outpatient Medications  Medication Sig Dispense Refill  . albuterol (VENTOLIN HFA) 108 (90 Base) MCG/ACT inhaler Inhale 2 puffs into the lungs every 6 (six) hours as needed for wheezing or shortness of breath.    . ALPRAZolam (XANAX) 1 MG tablet Take 1 mg by mouth 2 (two) times daily.     . Ascorbic Acid (VITAMIN C)  1000 MG tablet Take 1,000 mg by mouth daily.    Marland Kitchen b complex vitamins tablet Take 1 tablet by mouth daily.    . cetirizine (ZYRTEC) 10 MG tablet Take 10 mg by mouth at bedtime.     . Cholecalciferol (DIALYVITE VITAMIN D 5000) 125 MCG (5000 UT) capsule Take 5,000 Units by mouth daily.    . folic acid (FOLVITE) 1 MG tablet Take 1 mg by mouth daily.  1  . gabapentin (NEURONTIN) 400 MG capsule Take 400-800 mg by mouth See admin instructions. Take 1 capsule (400 mg) by mouth in the morning & take 2 capsules (800 mg) by mouth at night.    Marland Kitchen ipratropium-albuterol (DUONEB) 0.5-2.5 (3) MG/3ML SOLN Take 3 mLs by nebulization every 4 (four) hours as needed (shortness of breath).    Marland Kitchen levothyroxine (SYNTHROID) 100 MCG tablet Take 100 mcg by mouth daily before breakfast.    . Omega-3 Fatty Acids (FISH OIL) 1000 MG CAPS Take 1,000 mg by mouth 3 (three) times daily.     Marland Kitchen omeprazole (PRILOSEC) 40 MG capsule TAKE 1 CAPSULE BY MOUTH EVERY DAY (Patient taking differently: Take 40 mg by mouth daily. ) 90 capsule 1  . Pramoxine-Camphor-Zinc Acetate (ANTI ITCH EX) Apply 1 application topically daily as needed (itching).    . sertraline (ZOLOFT) 50 MG tablet Take 50 mg by mouth daily.    . tizanidine (ZANAFLEX) 2 MG capsule Take 2 mg by mouth 2 (two) times daily.     . traZODone (DESYREL) 50 MG tablet Take 25 mg by mouth at bedtime.     . Vitamin A (CVS VITAMIN A) 2400 MCG (8000 UT) CAPS Take 2,400 mcg by mouth daily.    . vitamin B-12 (CYANOCOBALAMIN) 50 MCG tablet Take 50 mcg by mouth daily.    Marland Kitchen VITAMIN E PO Take 1 capsule by mouth daily.     No current facility-administered medications for this visit.   Allergies:  Patient has no known allergies.   Social History: The patient  reports that she quit smoking about 7 years ago. Her smoking use included cigarettes. She started smoking about 56 years ago. She has a 80.00 pack-year smoking history. She has never used smokeless tobacco. She reports that she does not  drink alcohol and does not use drugs.   Family History: The patient's family history is not on file.   ROS:  Please see the history of present illness. Otherwise, complete review of systems is positive for none.  All other systems are reviewed and negative.   Physical Exam: VS:  BP 118/62   Pulse 66   Ht 5\' 2"  (1.575 m)   Wt 130 lb (59 kg)   SpO2 92%   BMI 23.78 kg/m , BMI Body mass index is 23.78 kg/m.  Wt Readings from Last 3 Encounters:  05/13/20 130 lb (59 kg)  04/09/20 125 lb (56.7 kg)  12/04/19 135 lb (61.2 kg)    General: Patient appears comfortable at rest. Neck: Supple, no elevated JVP or  carotid bruits, no thyromegaly. Lungs: Clear to auscultation, nonlabored breathing at rest. Cardiac: Regular rate and rhythm, no S3 or significant systolic murmur, no pericardial rub. Extremities: No pitting edema, distal pulses 2+. Skin: Warm and dry. Musculoskeletal: No kyphosis. Neuropsychiatric: Alert and oriented x3, affect grossly appropriate.  ECG:  An ECG dated 05/13/2020 was personally reviewed today and demonstrated:  Sinus rhythm rate of 89  Recent Labwork: 04/05/2020: BUN 13; Creatinine, Ser 0.97; Hemoglobin 10.6; Platelets 231; Potassium 4.2; Sodium 140  No results found for: CHOL, TRIG, HDL, CHOLHDL, VLDL, LDLCALC, LDLDIRECT  Other Studies Reviewed Today:  04/09/2020 PPM GENERATOR CHANGEOUT  Conclusion  SURGEON:  Thompson Grayer, MD    PREPROCEDURE DIAGNOSES:   1. Sick sinus syndrome.   2. Elective Replacement Indicator Status   POSTPROCEDURE DIAGNOSES:   1. Sick sinus syndrome.   2. Elective Replacement Indicator Status   PROCEDURES:   1. Pacemaker pulse generator replacement.   2. Skin pocket revision.     CONCLUSIONS:   1. Successful pacemaker pulse generator replacement with a St Jude Medical Assurity MRI model [id number] PPM for sick sinus syndrome and elective replacement indicator battery status   2. No early apparent complications   Echocardiogram  05/08/2020 1. Left ventricular ejection fraction, by estimation, is 55 to 60%. The left ventricle has normal function. The left ventricle has no regional wall motion abnormalities. Left ventricular diastolic parameters are indeterminate. 2. Right ventricular systolic function is normal. The right ventricular size is normal. There is normal pulmonary artery systolic pressure. The estimated right ventricular systolic pressure is 38.9 mmHg. 3. The mitral valve is grossly normal. Trivial mitral valve regurgitation. 4. The aortic valve is tricuspid. Aortic valve regurgitation is mild. Mild to moderate aortic valve sclerosis/calcification is present, without any evidence of aortic stenosis. Aortic regurgitation PHT measures 568 msec. 5. The inferior vena cava is normal in size with greater than 50% respiratory variability, suggesting right atrial pressure of 3 mmHg.   Assessment and Plan:  1. Sick sinus syndrome ([place])   2. Essential hypertension    1. Sick sinus syndrome [facility]) Recent pacemaker generator replacement on 04/09/2020 by Dr. Rayann Heman.  Denies any recent issues with pacemaker. Recent in clinic device check on 04/23/2020 showed normal device function. Thresholds, sensing, and impedances consistent with implant measurements. Device programmed at chronic  settings due to mature leads.Delman Cheadle distribution appropriate for patient and level of activity. No mode switches or high ventricular rates noted.  She has a follow-up with Dr. Rayann Heman December 17 at 93 AM.  2. Essential hypertension Blood pressure today is well controlled at 118/62.  Currently not on antihypertensive medication.  Reviewed the results of the recent echocardiogram with patient.  Informed her she had some very mild mitral valve regurgitation along with some mild aortic valve regurgitation.  EF was 55 to 60%.  Patient verbalizes understanding explanation.  Medication Adjustments/Labs and Tests Ordered: Current medicines  are reviewed at length with the patient today.  Concerns regarding medicines are outlined above.   Disposition: Follow-up with Dr. Domenic Polite or APP 1 year  Signed, Levell July, NP 05/13/2020 2:38 PM    [facility] [place], [place], [place] [address] Phone: [phone]; Fax: [phone]

## 2020-05-13 ENCOUNTER — Ambulatory Visit: Payer: PPO | Admitting: Family Medicine

## 2020-05-13 ENCOUNTER — Encounter: Payer: Self-pay | Admitting: Family Medicine

## 2020-05-13 VITALS — BP 118/62 | HR 66 | Ht 62.0 in | Wt 130.0 lb

## 2020-05-13 DIAGNOSIS — I10 Essential (primary) hypertension: Secondary | ICD-10-CM | POA: Diagnosis not present

## 2020-05-13 DIAGNOSIS — I495 Sick sinus syndrome: Secondary | ICD-10-CM | POA: Diagnosis not present

## 2020-05-13 NOTE — Patient Instructions (Signed)

## 2020-05-20 ENCOUNTER — Telehealth: Payer: Self-pay | Admitting: *Deleted

## 2020-05-20 NOTE — Telephone Encounter (Signed)
-----   Message from Satira Sark, MD sent at 05/08/2020  4:05 PM EDT ----- Results reviewed.  Follow-up echocardiogram shows normal LVEF of 55 to 60%.  Aortic valve remains calcified and sclerotic, but still not stenotic.  Keep scheduled follow-up.

## 2020-05-20 NOTE — Telephone Encounter (Signed)
Pt aware.

## 2020-05-28 ENCOUNTER — Encounter (INDEPENDENT_AMBULATORY_CARE_PROVIDER_SITE_OTHER): Payer: Self-pay

## 2020-06-03 DIAGNOSIS — Z0389 Encounter for observation for other suspected diseases and conditions ruled out: Secondary | ICD-10-CM | POA: Diagnosis not present

## 2020-06-03 DIAGNOSIS — M810 Age-related osteoporosis without current pathological fracture: Secondary | ICD-10-CM | POA: Diagnosis not present

## 2020-06-04 DIAGNOSIS — Z6823 Body mass index (BMI) 23.0-23.9, adult: Secondary | ICD-10-CM | POA: Diagnosis not present

## 2020-06-04 DIAGNOSIS — J449 Chronic obstructive pulmonary disease, unspecified: Secondary | ICD-10-CM | POA: Diagnosis not present

## 2020-06-04 DIAGNOSIS — E038 Other specified hypothyroidism: Secondary | ICD-10-CM | POA: Diagnosis not present

## 2020-06-04 DIAGNOSIS — K219 Gastro-esophageal reflux disease without esophagitis: Secondary | ICD-10-CM | POA: Diagnosis not present

## 2020-06-04 DIAGNOSIS — J301 Allergic rhinitis due to pollen: Secondary | ICD-10-CM | POA: Diagnosis not present

## 2020-06-04 DIAGNOSIS — F418 Other specified anxiety disorders: Secondary | ICD-10-CM | POA: Diagnosis not present

## 2020-06-07 DIAGNOSIS — F418 Other specified anxiety disorders: Secondary | ICD-10-CM | POA: Diagnosis not present

## 2020-06-07 DIAGNOSIS — J449 Chronic obstructive pulmonary disease, unspecified: Secondary | ICD-10-CM | POA: Diagnosis not present

## 2020-06-07 DIAGNOSIS — J301 Allergic rhinitis due to pollen: Secondary | ICD-10-CM | POA: Diagnosis not present

## 2020-06-07 DIAGNOSIS — E038 Other specified hypothyroidism: Secondary | ICD-10-CM | POA: Diagnosis not present

## 2020-07-02 ENCOUNTER — Ambulatory Visit (INDEPENDENT_AMBULATORY_CARE_PROVIDER_SITE_OTHER): Payer: PPO | Admitting: Internal Medicine

## 2020-07-04 ENCOUNTER — Ambulatory Visit (INDEPENDENT_AMBULATORY_CARE_PROVIDER_SITE_OTHER): Payer: PPO | Admitting: Gastroenterology

## 2020-07-09 ENCOUNTER — Ambulatory Visit (INDEPENDENT_AMBULATORY_CARE_PROVIDER_SITE_OTHER): Payer: PPO

## 2020-07-09 DIAGNOSIS — I495 Sick sinus syndrome: Secondary | ICD-10-CM

## 2020-07-09 LAB — CUP PACEART REMOTE DEVICE CHECK
Battery Remaining Longevity: 131 mo
Battery Remaining Percentage: 95.5 %
Battery Voltage: 3.04 V
Brady Statistic AP VP Percent: 1 %
Brady Statistic AP VS Percent: 59 %
Brady Statistic AS VP Percent: 1 %
Brady Statistic AS VS Percent: 41 %
Brady Statistic RA Percent Paced: 59 %
Brady Statistic RV Percent Paced: 1 %
Date Time Interrogation Session: 20211214020012
Implantable Lead Implant Date: 20100908
Implantable Lead Implant Date: 20100908
Implantable Lead Location: 753859
Implantable Lead Location: 753860
Implantable Lead Model: 1944
Implantable Lead Model: 1948
Implantable Pulse Generator Implant Date: 20210914
Lead Channel Impedance Value: 510 Ohm
Lead Channel Impedance Value: 540 Ohm
Lead Channel Pacing Threshold Amplitude: 0.5 V
Lead Channel Pacing Threshold Amplitude: 0.625 V
Lead Channel Pacing Threshold Pulse Width: 0.4 ms
Lead Channel Pacing Threshold Pulse Width: 0.5 ms
Lead Channel Sensing Intrinsic Amplitude: 3.4 mV
Lead Channel Sensing Intrinsic Amplitude: 9.3 mV
Lead Channel Setting Pacing Amplitude: 0.75 V
Lead Channel Setting Pacing Amplitude: 1.625
Lead Channel Setting Pacing Pulse Width: 0.4 ms
Lead Channel Setting Sensing Sensitivity: 4 mV
Pulse Gen Model: 2272
Pulse Gen Serial Number: 3863879

## 2020-07-12 ENCOUNTER — Ambulatory Visit (INDEPENDENT_AMBULATORY_CARE_PROVIDER_SITE_OTHER): Payer: PPO | Admitting: Internal Medicine

## 2020-07-12 ENCOUNTER — Encounter: Payer: Self-pay | Admitting: Internal Medicine

## 2020-07-12 VITALS — BP 94/48 | HR 67 | Resp 16 | Ht 61.0 in | Wt 126.8 lb

## 2020-07-12 DIAGNOSIS — I495 Sick sinus syndrome: Secondary | ICD-10-CM

## 2020-07-12 NOTE — Patient Instructions (Addendum)
Medication Instructions:   Your physician recommends that you continue on your current medications as directed. Please refer to the Current Medication list given to you today.  Labwork:  None  Testing/Procedures:  None  Follow-Up:  Your physician recommends that you schedule a follow-up appointment in: 1 year.  Any Other Special Instructions Will Be Listed Below (If Applicable).  If you need a refill on your cardiac medications before your next appointment, please call your pharmacy. 

## 2020-07-12 NOTE — Progress Notes (Signed)
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  Since her generator change, the patient reports doing very well.  Her pocket has healed without event.  She has rare palpitations at night.   Today, she denies symptoms of chest pain, shortness of breath,  lower extremity edema, dizziness, presyncope, or syncope.  The patient is otherwise without complaint today.   Past Medical History:  Diagnosis Date  . Chronic pain   . COPD with emphysema ([place])   . Depression   . GERD (gastroesophageal reflux disease)   . Hypertension   . Mitral and aortic regurgitation    Mild  . Neurodermatitis   . Peptic ulcer   . Sick sinus syndrome (HCC)    s/p PPM (SJM)  . Syncope    Past Surgical History:  Procedure Laterality Date  . APPENDECTOMY    . BIOPSY  05/12/2019   Procedure: BIOPSY;  Surgeon: Rogene Houston, MD;  [facility];  Service: Endoscopy;;  antral, pre-pyloric mucosa  . CARPAL TUNNEL RELEASE    . CHOLECYSTECTOMY    . COLONOSCOPY WITH PROPOFOL N/A 03/03/2019   Procedure: COLONOSCOPY WITH PROPOFOL;  Surgeon: Rogene Houston, MD;  [facility];  Service: Endoscopy;  Laterality: N/A;  9:50  . ESOPHAGEAL DILATION N/A 01/15/2017   Procedure: ESOPHAGEAL DILATION;  Surgeon: Rogene Houston, MD;  [facility];  Service: Endoscopy;  Laterality: N/A;  . ESOPHAGOGASTRODUODENOSCOPY (EGD) WITH PROPOFOL N/A 01/15/2017   Procedure: ESOPHAGOGASTRODUODENOSCOPY (EGD) WITH PROPOFOL;  Surgeon: Rogene Houston, MD;  [facility];  Service: Endoscopy;  Laterality: N/A;  9:15  . ESOPHAGOGASTRODUODENOSCOPY (EGD) WITH PROPOFOL N/A 05/12/2019   Procedure: ESOPHAGOGASTRODUODENOSCOPY (EGD) WITH PROPOFOL;  Surgeon: Rogene Houston, MD;  [facility];  Service: Endoscopy;  Laterality: N/A;  855am  . NISSEN FUNDOPLICATION    . PACEMAKER INSERTION     St Jude  .  PPM GENERATOR CHANGEOUT N/A 04/09/2020   Procedure: PPM GENERATOR CHANGEOUT;  Surgeon: Thompson Grayer, MD;  Location: [facility];  Service: Cardiovascular;  Laterality: N/A;  . SHOULDER SURGERY    . SPLENECTOMY      ROS- all systems are reviewed and negative except as per HPI above  Current Outpatient Medications  Medication Sig Dispense Refill  . albuterol (VENTOLIN HFA) 108 (90 Base) MCG/ACT inhaler Inhale 2 puffs into the lungs every 6 (six) hours as needed for wheezing or shortness of breath.    . ALPRAZolam (XANAX) 1 MG tablet Take 1 mg by mouth 2 (two) times daily.    . Ascorbic Acid (VITAMIN C) 1000 MG tablet Take 1,000 mg by mouth daily.    Marland Kitchen b complex vitamins tablet Take 1 tablet by mouth daily.    . Cholecalciferol (DIALYVITE VITAMIN D 5000) 125 MCG (5000 UT) capsule Take 5,000 Units by mouth daily.    . folic acid (FOLVITE) 1 MG tablet Take 1 mg by mouth daily.  1  . gabapentin (NEURONTIN) 400 MG capsule Take 400-800 mg by mouth See admin instructions. Take 1 capsule (400 mg) by mouth in the morning & take 2 capsules (800 mg) by mouth at night.    Marland Kitchen ipratropium-albuterol (DUONEB) 0.5-2.5 (3) MG/3ML SOLN Take 3 mLs by nebulization every 4 (four) hours as needed (shortness of breath).    Marland Kitchen levothyroxine (SYNTHROID) 100 MCG tablet Take 100  mcg by mouth daily before breakfast.    . Omega-3 Fatty Acids (FISH OIL) 1000 MG CAPS Take 1,000 mg by mouth 3 (three) times daily.     Marland Kitchen omeprazole (PRILOSEC) 40 MG capsule TAKE 1 CAPSULE BY MOUTH EVERY DAY (Patient taking differently: Take 40 mg by mouth daily.) 90 capsule 1  . Pramoxine-Camphor-Zinc Acetate (ANTI ITCH EX) Apply 1 application topically daily as needed (itching).    . sertraline (ZOLOFT) 50 MG tablet Take 50 mg by mouth daily.    . traZODone (DESYREL) 50 MG tablet Take 25 mg by mouth at bedtime.    . Vitamin A 2400 MCG (8000 UT) CAPS Take 2,400 mcg by mouth daily.    . vitamin B-12 (CYANOCOBALAMIN) 50 MCG tablet Take 50 mcg  by mouth daily.    Marland Kitchen VITAMIN E PO Take 1 capsule by mouth daily.     No current facility-administered medications for this visit.    Physical Exam: Vitals:   07/12/20 1056  BP: (!) 94/48  Pulse: 67  Resp: 16  SpO2: 93%  Weight: 126 lb 12.8 oz (57.5 kg)  Height: 5\' 1"  (1.549 m)    GEN- The patient is well appearing, alert and oriented x 3 today.   Head- normocephalic, atraumatic Eyes-  Sclera clear, conjunctiva pink Ears- hearing intact Oropharynx- clear Lungs-  normal work of breathing Chest- pacemaker pocket is well healed Heart- Regular rate and rhythm  GI- soft Extremities- no clubbing, cyanosis, or edema  Pacemaker interrogation- reviewed in detail today,  See PACEART report   Assessment and Plan:  1. Symptomatic sinus bradycardia  Normal pacemaker function See Pace Art report No changes today she is not device dependant today  2. Tobacco Quit since 2014  Risks, benefits and potential toxicities for medications prescribed and/or refilled reviewed with patient today.   Return in a year  Thompson Grayer MD, [facility] 07/12/2020 11:09 AM

## 2020-07-24 NOTE — Progress Notes (Signed)
Remote pacemaker transmission.   

## 2020-09-02 ENCOUNTER — Telehealth (INDEPENDENT_AMBULATORY_CARE_PROVIDER_SITE_OTHER): Payer: Medicare Other | Admitting: Gastroenterology

## 2020-09-02 ENCOUNTER — Other Ambulatory Visit: Payer: Self-pay

## 2020-09-02 ENCOUNTER — Encounter (INDEPENDENT_AMBULATORY_CARE_PROVIDER_SITE_OTHER): Payer: Self-pay | Admitting: Gastroenterology

## 2020-09-02 VITALS — Ht 61.0 in | Wt 126.0 lb

## 2020-09-02 DIAGNOSIS — G8929 Other chronic pain: Secondary | ICD-10-CM

## 2020-09-02 DIAGNOSIS — R1013 Epigastric pain: Secondary | ICD-10-CM | POA: Diagnosis not present

## 2020-09-02 MED ORDER — DICYCLOMINE HCL 10 MG PO CAPS: 10.0000 mg | ORAL_CAPSULE | Freq: Two times a day (BID) | ORAL | 1 refills | Status: DC | PRN

## 2020-09-02 MED ORDER — SIMETHICONE 80 MG PO TABS: 1.0000 | ORAL_TABLET | Freq: Every day | ORAL | 1 refills | Status: DC

## 2020-09-02 NOTE — Patient Instructions (Addendum)
Start simethicone in AM once a day Can take Bentyl 10 mm as needed if abdominal pain persists.

## 2020-09-02 NOTE — Progress Notes (Signed)
[name], M.D. Gastroenterology & Hepatology [facility] [address] [place], [place] [address] Primary Care Physician: Neale Burly, MD [place] [address]  This is a telephone virtual visit.  It required patient-provider interaction for the medical decision making as documented below. The patient has consented and agreed to proceed with a Telehealth encounter given the current Coronavirus pandemic.  VIRTUAL VISIT NOTE Patient location: Home Provider location: Office  I will communicate my assessment and recommendations to the referring MD via EMR.  Problems: 1. Chronic epigastric abdominal pain 2. GERD s/p Nissen fundoplication  History of Present Illness: [name] is a 75 y.o. female history of sick sinus syndrome status post PPM , syncope, mitral and aortic regurgitation, HTN, GERD, COPD, who presents for evaluation of epigastric pain.  Patient was last seen in clinic on 07/18/2019.  At that time she was ordered to have a CT of the abdomen and pelvis with IV contrast, CBC, CMP and lipase but she did not perform any of the studies.  Her most recent blood work-up was from 04/05/2020 which showed a normal BMP, CBC showed a hemoglobin of 10.6 with MCV of 95, although cell count 6.9 and platelets 231.  Patient reports that for the last two years she has presented episodes of abdominal pain in the upper abdomen when she takes her medication.  Due to this, she only presents the abdominal pain in the morning and eventually resolves.  She takes omeprazole in the morning, it does not help to improve her pain. She has the pain every day and is not related to any food intake. The patient reports that she feels that she presents improvement of her symptoms after she burps. Has not tried using Gasex in the past.  She otherwise denies having any other symptoms. The patient denies having any nausea, vomiting, fever,  chills, hematochezia, melena, hematemesis, abdominal distention, heartburn, diarrhea, jaundice, pruritus or weight loss.  Last EGD was performed on 05/12/2019 : Esophagus was normal.  She had a 2 cm hiatal hernia.  Diffuse mild inflammation characterized by congestion/edema and granularity found in the gastric antrum.  Biopsies were negative for H. pylori or dysplasia, but showed presence of intestinal metaplasia in the antrum and the pylorus..  Evidence of gastritis.  Erythematous and flattened mucosa seen on previous pyloric region of the stomach.  Niesen fundoplication was found.  The wrap appeared loose.   Last colonoscopy on 03/03/2019 showed diverticulosis in the sigmoid colon and the descending colon and transverse colon.  External hemorrhoids  Patient had a CT abdomen ordered but did not pursue it.  Past Medical History: Past Medical History:  Diagnosis Date  . Chronic pain   . COPD with emphysema ([place])   . Depression   . GERD (gastroesophageal reflux disease)   . Hypertension   . Mitral and aortic regurgitation    Mild  . Neurodermatitis   . Peptic ulcer   . Sick sinus syndrome (HCC)    s/p PPM (SJM)  . Syncope     Past Surgical History: Past Surgical History:  Procedure Laterality Date  . APPENDECTOMY    . BIOPSY  05/12/2019   Procedure: BIOPSY;  Surgeon: Rogene Houston, MD;  [facility];  Service: Endoscopy;;  antral, pre-pyloric mucosa  . CARPAL TUNNEL RELEASE    . CHOLECYSTECTOMY    . COLONOSCOPY WITH PROPOFOL N/A 03/03/2019   Procedure: COLONOSCOPY WITH PROPOFOL;  Surgeon: Hildred Laser  U, MD;  [facility];  Service: Endoscopy;  Laterality: N/A;  9:50  . ESOPHAGEAL DILATION N/A 01/15/2017   Procedure: ESOPHAGEAL DILATION;  Surgeon: Rogene Houston, MD;  [facility];  Service: Endoscopy;  Laterality: N/A;  . ESOPHAGOGASTRODUODENOSCOPY (EGD) WITH PROPOFOL N/A 01/15/2017   Procedure: ESOPHAGOGASTRODUODENOSCOPY (EGD) WITH PROPOFOL;   Surgeon: Rogene Houston, MD;  [facility];  Service: Endoscopy;  Laterality: N/A;  9:15  . ESOPHAGOGASTRODUODENOSCOPY (EGD) WITH PROPOFOL N/A 05/12/2019   Procedure: ESOPHAGOGASTRODUODENOSCOPY (EGD) WITH PROPOFOL;  Surgeon: Rogene Houston, MD;  [facility];  Service: Endoscopy;  Laterality: N/A;  855am  . NISSEN FUNDOPLICATION    . PACEMAKER INSERTION     St Jude  . PPM GENERATOR CHANGEOUT N/A 04/09/2020   Procedure: PPM GENERATOR CHANGEOUT;  Surgeon: Thompson Grayer, MD;  Location: [place] [facility];  Service: Cardiovascular;  Laterality: N/A;  . SHOULDER SURGERY    . SPLENECTOMY      Family History:History reviewed. No pertinent family history.  Social History: Social History   Tobacco Use  Smoking Status Former Smoker  . Packs/day: 2.00  . Years: 40.00  . Pack years: 80.00  . Types: Cigarettes  . Start date: 11/13/1963  . Quit date: 08/18/2012  . Years since quitting: 8.0  Smokeless Tobacco Never Used  Tobacco Comment   I am pleased that she recently quit.   Social History   Substance and Sexual Activity  Alcohol Use No  . Alcohol/week: 0.0 standard drinks   Comment: Former heavy drinker   Social History   Substance and Sexual Activity  Drug Use No   Comment: Former benodiazepine dependence    Allergies: No Known Allergies  Medications: Current Outpatient Medications  Medication Sig Dispense Refill  . albuterol (VENTOLIN HFA) 108 (90 Base) MCG/ACT inhaler Inhale 2 puffs into the lungs every 6 (six) hours as needed for wheezing or shortness of breath.    . ALPRAZolam (XANAX) 1 MG tablet Take 1 mg by mouth 2 (two) times daily.    . Ascorbic Acid (VITAMIN C) 1000 MG tablet Take 1,000 mg by mouth daily.    Marland Kitchen b complex vitamins tablet Take 1 tablet by mouth daily.    . Cholecalciferol (DIALYVITE VITAMIN D 5000) 125 MCG (5000 UT) capsule Take 5,000 Units by mouth daily.    . ferrous sulfate 325 (65 FE) MG tablet Take 325 mg by mouth daily  with breakfast.    . folic acid (FOLVITE) 1 MG tablet Take 1 mg by mouth daily.  1  . gabapentin (NEURONTIN) 400 MG capsule Take 400-800 mg by mouth See admin instructions. Take 1 capsule (400 mg) by mouth in the morning & take 2 capsules (800 mg) by mouth at night.    Marland Kitchen ipratropium-albuterol (DUONEB) 0.5-2.5 (3) MG/3ML SOLN Take 3 mLs by nebulization every 4 (four) hours as needed (shortness of breath).    Marland Kitchen levothyroxine (SYNTHROID) 100 MCG tablet Take 100 mcg by mouth daily before breakfast.    . Multiple Vitamins-Minerals (WOMENS MULTI PO) Take 1 capsule by mouth daily.    . Omega-3 Fatty Acids (FISH OIL) 1000 MG CAPS Take 1,000 mg by mouth 3 (three) times daily.     Marland Kitchen omeprazole (PRILOSEC) 40 MG capsule TAKE 1 CAPSULE BY MOUTH EVERY DAY (Patient taking differently: Take 40 mg by mouth daily.) 90 capsule 1  . Pramoxine-Camphor-Zinc Acetate (ANTI ITCH EX) Apply 1 application topically daily as needed (itching).    Marland Kitchen  sertraline (ZOLOFT) 50 MG tablet Take 50 mg by mouth daily.    . Vitamin A 2400 MCG (8000 UT) CAPS Take 2,400 mcg by mouth daily.    . vitamin B-12 (CYANOCOBALAMIN) 50 MCG tablet Take 50 mcg by mouth daily.    Marland Kitchen VITAMIN E PO Take 1 capsule by mouth daily.     No current facility-administered medications for this visit.    Review of Systems: GENERAL: negative for malaise, night sweats HEENT: No changes in hearing or vision, no nose bleeds or other nasal problems. NECK: Negative for lumps, goiter, pain and significant neck swelling RESPIRATORY: Negative for cough, wheezing CARDIOVASCULAR: Negative for chest pain, leg swelling, palpitations, orthopnea GI: SEE HPI MUSCULOSKELETAL: Negative for joint pain or swelling, back pain, and muscle pain. SKIN: Negative for lesions, rash PSYCH: Negative for sleep disturbance, mood disorder and recent psychosocial stressors. HEMATOLOGY Negative for prolonged bleeding, bruising easily, and swollen nodes. ENDOCRINE: Negative for cold or heat  intolerance, polyuria, polydipsia and goiter. NEURO: negative for tremor, gait imbalance, syncope and seizures. The remainder of the review of systems is noncontributory.   Physical Exam: No exam was performed as this was a telephone encounter  Imaging/Labs: as above  I personally reviewed and interpreted the available labs, imaging and endoscopic files.  Impression and Plan: [name] is a 75 y.o. female history of sick sinus syndrome status post PPM, syncope, mitral and aortic regurgitation, HTN, GERD, COPD, who presents for evaluation of epigastric pain.  The patient has presented recurrent symptoms of post prandial morning pain in the epigastric area but has not presented any other red flag signs.  She underwent recent endoscopic investigation that did not show any explanation for her abdominal symptoms.  In fact, she was ordered to have abdominal imaging and further blood work-up but she did not perform this for unknown reasons.  For now, I advised to the patient to take simethicone as needed to decrease her gas production as burping improves her symptomatology.  She can take Bentyl as needed if the pain persist despite taking the simethicone.  Ultimately, if her symptoms persist, we will need to reorder previous blood testing and abdominal imaging to explore other etiologies.  -Start simethicone in AM once a day -Can take Bentyl 10 mg as needed if abdominal pain persists.  All questions were answered.      Total visit time: I spent a total of  20 minutes  [name], MD Gastroenterology and Hepatology [facility]

## 2020-09-04 DIAGNOSIS — K219 Gastro-esophageal reflux disease without esophagitis: Secondary | ICD-10-CM | POA: Diagnosis not present

## 2020-09-04 DIAGNOSIS — E038 Other specified hypothyroidism: Secondary | ICD-10-CM | POA: Diagnosis not present

## 2020-09-04 DIAGNOSIS — J449 Chronic obstructive pulmonary disease, unspecified: Secondary | ICD-10-CM | POA: Diagnosis not present

## 2020-09-04 DIAGNOSIS — F418 Other specified anxiety disorders: Secondary | ICD-10-CM | POA: Diagnosis not present

## 2020-09-11 ENCOUNTER — Telehealth (INDEPENDENT_AMBULATORY_CARE_PROVIDER_SITE_OTHER): Payer: Self-pay

## 2020-09-11 NOTE — Telephone Encounter (Signed)
Authorization for Dicyclomine 10 mg Bid prn was approved per [employer] on 09/11/2020.

## 2020-09-13 DIAGNOSIS — E038 Other specified hypothyroidism: Secondary | ICD-10-CM | POA: Diagnosis not present

## 2020-09-23 DIAGNOSIS — F418 Other specified anxiety disorders: Secondary | ICD-10-CM | POA: Diagnosis not present

## 2020-09-23 DIAGNOSIS — E038 Other specified hypothyroidism: Secondary | ICD-10-CM | POA: Diagnosis not present

## 2020-09-23 DIAGNOSIS — J449 Chronic obstructive pulmonary disease, unspecified: Secondary | ICD-10-CM | POA: Diagnosis not present

## 2020-09-23 DIAGNOSIS — K219 Gastro-esophageal reflux disease without esophagitis: Secondary | ICD-10-CM | POA: Diagnosis not present

## 2020-10-08 ENCOUNTER — Ambulatory Visit (INDEPENDENT_AMBULATORY_CARE_PROVIDER_SITE_OTHER): Payer: Medicare Other

## 2020-10-08 DIAGNOSIS — I495 Sick sinus syndrome: Secondary | ICD-10-CM | POA: Diagnosis not present

## 2020-10-08 LAB — CUP PACEART REMOTE DEVICE CHECK
Battery Remaining Longevity: 128 mo
Battery Remaining Percentage: 95.5 %
Battery Voltage: 3.02 V
Brady Statistic AP VP Percent: 1 %
Brady Statistic AP VS Percent: 62 %
Brady Statistic AS VP Percent: 1 %
Brady Statistic AS VS Percent: 37 %
Brady Statistic RA Percent Paced: 63 %
Brady Statistic RV Percent Paced: 1 %
Date Time Interrogation Session: 20220315020014
Implantable Lead Implant Date: 20100908
Implantable Lead Implant Date: 20100908
Implantable Lead Location: 753859
Implantable Lead Location: 753860
Implantable Lead Model: 1944
Implantable Lead Model: 1948
Implantable Pulse Generator Implant Date: 20210914
Lead Channel Impedance Value: 510 Ohm
Lead Channel Impedance Value: 530 Ohm
Lead Channel Pacing Threshold Amplitude: 0.625 V
Lead Channel Pacing Threshold Amplitude: 0.625 V
Lead Channel Pacing Threshold Pulse Width: 0.4 ms
Lead Channel Pacing Threshold Pulse Width: 0.5 ms
Lead Channel Sensing Intrinsic Amplitude: 3.2 mV
Lead Channel Sensing Intrinsic Amplitude: 9.5 mV
Lead Channel Setting Pacing Amplitude: 0.875
Lead Channel Setting Pacing Amplitude: 1.625
Lead Channel Setting Pacing Pulse Width: 0.4 ms
Lead Channel Setting Sensing Sensitivity: 4 mV
Pulse Gen Model: 2272
Pulse Gen Serial Number: 3863879

## 2020-10-17 NOTE — Progress Notes (Signed)
Remote pacemaker transmission.   

## 2020-10-29 DIAGNOSIS — M19032 Primary osteoarthritis, left wrist: Secondary | ICD-10-CM | POA: Diagnosis not present

## 2020-10-29 DIAGNOSIS — M79642 Pain in left hand: Secondary | ICD-10-CM | POA: Diagnosis not present

## 2020-10-29 DIAGNOSIS — R29898 Other symptoms and signs involving the musculoskeletal system: Secondary | ICD-10-CM | POA: Diagnosis not present

## 2020-10-29 DIAGNOSIS — M19042 Primary osteoarthritis, left hand: Secondary | ICD-10-CM | POA: Diagnosis not present

## 2020-11-11 ENCOUNTER — Other Ambulatory Visit (INDEPENDENT_AMBULATORY_CARE_PROVIDER_SITE_OTHER): Payer: Self-pay | Admitting: Gastroenterology

## 2020-11-11 DIAGNOSIS — G8929 Other chronic pain: Secondary | ICD-10-CM

## 2020-11-11 NOTE — Telephone Encounter (Signed)
Last seen 09/02/2020 for Abdominal pain.

## 2020-11-23 DIAGNOSIS — E038 Other specified hypothyroidism: Secondary | ICD-10-CM | POA: Diagnosis not present

## 2020-11-23 DIAGNOSIS — F418 Other specified anxiety disorders: Secondary | ICD-10-CM | POA: Diagnosis not present

## 2020-11-23 DIAGNOSIS — K219 Gastro-esophageal reflux disease without esophagitis: Secondary | ICD-10-CM | POA: Diagnosis not present

## 2020-11-23 DIAGNOSIS — J449 Chronic obstructive pulmonary disease, unspecified: Secondary | ICD-10-CM | POA: Diagnosis not present

## 2020-11-26 DIAGNOSIS — M1812 Unilateral primary osteoarthritis of first carpometacarpal joint, left hand: Secondary | ICD-10-CM | POA: Diagnosis not present

## 2020-11-26 DIAGNOSIS — M19032 Primary osteoarthritis, left wrist: Secondary | ICD-10-CM | POA: Diagnosis not present

## 2020-12-03 DIAGNOSIS — K219 Gastro-esophageal reflux disease without esophagitis: Secondary | ICD-10-CM | POA: Diagnosis not present

## 2020-12-03 DIAGNOSIS — J449 Chronic obstructive pulmonary disease, unspecified: Secondary | ICD-10-CM | POA: Diagnosis not present

## 2020-12-03 DIAGNOSIS — E038 Other specified hypothyroidism: Secondary | ICD-10-CM | POA: Diagnosis not present

## 2020-12-03 DIAGNOSIS — F418 Other specified anxiety disorders: Secondary | ICD-10-CM | POA: Diagnosis not present

## 2020-12-06 DIAGNOSIS — J449 Chronic obstructive pulmonary disease, unspecified: Secondary | ICD-10-CM | POA: Diagnosis not present

## 2020-12-06 DIAGNOSIS — F418 Other specified anxiety disorders: Secondary | ICD-10-CM | POA: Diagnosis not present

## 2020-12-06 DIAGNOSIS — J301 Allergic rhinitis due to pollen: Secondary | ICD-10-CM | POA: Diagnosis not present

## 2020-12-06 DIAGNOSIS — E038 Other specified hypothyroidism: Secondary | ICD-10-CM | POA: Diagnosis not present

## 2020-12-13 ENCOUNTER — Other Ambulatory Visit (INDEPENDENT_AMBULATORY_CARE_PROVIDER_SITE_OTHER): Payer: Self-pay | Admitting: Internal Medicine

## 2020-12-13 DIAGNOSIS — K219 Gastro-esophageal reflux disease without esophagitis: Secondary | ICD-10-CM

## 2020-12-24 DIAGNOSIS — M79642 Pain in left hand: Secondary | ICD-10-CM | POA: Diagnosis not present

## 2020-12-24 DIAGNOSIS — M79672 Pain in left foot: Secondary | ICD-10-CM | POA: Diagnosis not present

## 2020-12-24 DIAGNOSIS — M19032 Primary osteoarthritis, left wrist: Secondary | ICD-10-CM | POA: Diagnosis not present

## 2020-12-24 DIAGNOSIS — M1812 Unilateral primary osteoarthritis of first carpometacarpal joint, left hand: Secondary | ICD-10-CM | POA: Diagnosis not present

## 2020-12-24 DIAGNOSIS — R29898 Other symptoms and signs involving the musculoskeletal system: Secondary | ICD-10-CM | POA: Diagnosis not present

## 2021-01-07 ENCOUNTER — Ambulatory Visit (INDEPENDENT_AMBULATORY_CARE_PROVIDER_SITE_OTHER): Payer: Medicare Other

## 2021-01-07 DIAGNOSIS — I495 Sick sinus syndrome: Secondary | ICD-10-CM

## 2021-01-07 LAB — CUP PACEART REMOTE DEVICE CHECK
Battery Remaining Longevity: 128 mo
Battery Remaining Percentage: 95.5 %
Battery Voltage: 3.02 V
Brady Statistic AP VP Percent: 1 %
Brady Statistic AP VS Percent: 63 %
Brady Statistic AS VP Percent: 1 %
Brady Statistic AS VS Percent: 37 %
Brady Statistic RA Percent Paced: 63 %
Brady Statistic RV Percent Paced: 1 %
Date Time Interrogation Session: 20220614020012
Implantable Lead Implant Date: 20100908
Implantable Lead Implant Date: 20100908
Implantable Lead Location: 753859
Implantable Lead Location: 753860
Implantable Lead Model: 1944
Implantable Lead Model: 1948
Implantable Pulse Generator Implant Date: 20210914
Lead Channel Impedance Value: 580 Ohm
Lead Channel Impedance Value: 580 Ohm
Lead Channel Pacing Threshold Amplitude: 0.5 V
Lead Channel Pacing Threshold Amplitude: 0.5 V
Lead Channel Pacing Threshold Pulse Width: 0.4 ms
Lead Channel Pacing Threshold Pulse Width: 0.5 ms
Lead Channel Sensing Intrinsic Amplitude: 2.8 mV
Lead Channel Sensing Intrinsic Amplitude: 9.5 mV
Lead Channel Setting Pacing Amplitude: 0.75 V
Lead Channel Setting Pacing Amplitude: 1.5 V
Lead Channel Setting Pacing Pulse Width: 0.4 ms
Lead Channel Setting Sensing Sensitivity: 4 mV
Pulse Gen Model: 2272
Pulse Gen Serial Number: 3863879

## 2021-01-10 DIAGNOSIS — Z131 Encounter for screening for diabetes mellitus: Secondary | ICD-10-CM | POA: Diagnosis not present

## 2021-01-23 DIAGNOSIS — K219 Gastro-esophageal reflux disease without esophagitis: Secondary | ICD-10-CM | POA: Diagnosis not present

## 2021-01-23 DIAGNOSIS — J449 Chronic obstructive pulmonary disease, unspecified: Secondary | ICD-10-CM | POA: Diagnosis not present

## 2021-01-23 DIAGNOSIS — E038 Other specified hypothyroidism: Secondary | ICD-10-CM | POA: Diagnosis not present

## 2021-01-23 DIAGNOSIS — F418 Other specified anxiety disorders: Secondary | ICD-10-CM | POA: Diagnosis not present

## 2021-01-29 NOTE — Progress Notes (Signed)
Remote pacemaker transmission.   

## 2021-03-06 DIAGNOSIS — J301 Allergic rhinitis due to pollen: Secondary | ICD-10-CM | POA: Diagnosis not present

## 2021-03-06 DIAGNOSIS — K219 Gastro-esophageal reflux disease without esophagitis: Secondary | ICD-10-CM | POA: Diagnosis not present

## 2021-03-06 DIAGNOSIS — J449 Chronic obstructive pulmonary disease, unspecified: Secondary | ICD-10-CM | POA: Diagnosis not present

## 2021-03-06 DIAGNOSIS — F418 Other specified anxiety disorders: Secondary | ICD-10-CM | POA: Diagnosis not present

## 2021-03-25 DIAGNOSIS — L237 Allergic contact dermatitis due to plants, except food: Secondary | ICD-10-CM | POA: Diagnosis not present

## 2021-03-25 DIAGNOSIS — Z6821 Body mass index (BMI) 21.0-21.9, adult: Secondary | ICD-10-CM | POA: Diagnosis not present

## 2021-03-26 DIAGNOSIS — K219 Gastro-esophageal reflux disease without esophagitis: Secondary | ICD-10-CM | POA: Diagnosis not present

## 2021-03-26 DIAGNOSIS — J449 Chronic obstructive pulmonary disease, unspecified: Secondary | ICD-10-CM | POA: Diagnosis not present

## 2021-03-26 DIAGNOSIS — E038 Other specified hypothyroidism: Secondary | ICD-10-CM | POA: Diagnosis not present

## 2021-04-08 ENCOUNTER — Ambulatory Visit (INDEPENDENT_AMBULATORY_CARE_PROVIDER_SITE_OTHER): Payer: Medicare Other

## 2021-04-08 DIAGNOSIS — I495 Sick sinus syndrome: Secondary | ICD-10-CM

## 2021-04-08 LAB — CUP PACEART REMOTE DEVICE CHECK
Battery Remaining Longevity: 122 mo
Battery Remaining Percentage: 95 %
Battery Voltage: 3.02 V
Brady Statistic AP VP Percent: 1 %
Brady Statistic AP VS Percent: 62 %
Brady Statistic AS VP Percent: 1 %
Brady Statistic AS VS Percent: 37 %
Brady Statistic RA Percent Paced: 63 %
Brady Statistic RV Percent Paced: 1 %
Date Time Interrogation Session: 20220913020013
Implantable Lead Implant Date: 20100908
Implantable Lead Implant Date: 20100908
Implantable Lead Location: 753859
Implantable Lead Location: 753860
Implantable Lead Model: 1944
Implantable Lead Model: 1948
Implantable Pulse Generator Implant Date: 20210914
Lead Channel Impedance Value: 490 Ohm
Lead Channel Impedance Value: 540 Ohm
Lead Channel Pacing Threshold Amplitude: 0.375 V
Lead Channel Pacing Threshold Amplitude: 0.5 V
Lead Channel Pacing Threshold Pulse Width: 0.4 ms
Lead Channel Pacing Threshold Pulse Width: 0.5 ms
Lead Channel Sensing Intrinsic Amplitude: 3 mV
Lead Channel Sensing Intrinsic Amplitude: 9.9 mV
Lead Channel Setting Pacing Amplitude: 0.75 V
Lead Channel Setting Pacing Amplitude: 1.375
Lead Channel Setting Pacing Pulse Width: 0.4 ms
Lead Channel Setting Sensing Sensitivity: 4 mV
Pulse Gen Model: 2272
Pulse Gen Serial Number: 3863879

## 2021-04-10 DIAGNOSIS — L237 Allergic contact dermatitis due to plants, except food: Secondary | ICD-10-CM | POA: Diagnosis not present

## 2021-04-10 DIAGNOSIS — Z6821 Body mass index (BMI) 21.0-21.9, adult: Secondary | ICD-10-CM | POA: Diagnosis not present

## 2021-04-16 NOTE — Progress Notes (Signed)
Remote pacemaker transmission.   

## 2021-04-25 DIAGNOSIS — E038 Other specified hypothyroidism: Secondary | ICD-10-CM | POA: Diagnosis not present

## 2021-04-25 DIAGNOSIS — J449 Chronic obstructive pulmonary disease, unspecified: Secondary | ICD-10-CM | POA: Diagnosis not present

## 2021-06-12 DIAGNOSIS — F418 Other specified anxiety disorders: Secondary | ICD-10-CM | POA: Diagnosis not present

## 2021-06-12 DIAGNOSIS — I10 Essential (primary) hypertension: Secondary | ICD-10-CM | POA: Diagnosis not present

## 2021-06-12 DIAGNOSIS — M818 Other osteoporosis without current pathological fracture: Secondary | ICD-10-CM | POA: Diagnosis not present

## 2021-06-12 DIAGNOSIS — F330 Major depressive disorder, recurrent, mild: Secondary | ICD-10-CM | POA: Diagnosis not present

## 2021-06-25 DIAGNOSIS — M818 Other osteoporosis without current pathological fracture: Secondary | ICD-10-CM | POA: Diagnosis not present

## 2021-06-25 DIAGNOSIS — F418 Other specified anxiety disorders: Secondary | ICD-10-CM | POA: Diagnosis not present

## 2021-06-25 DIAGNOSIS — I10 Essential (primary) hypertension: Secondary | ICD-10-CM | POA: Diagnosis not present

## 2021-06-25 DIAGNOSIS — F330 Major depressive disorder, recurrent, mild: Secondary | ICD-10-CM | POA: Diagnosis not present

## 2021-06-27 ENCOUNTER — Encounter: Payer: PPO | Admitting: Internal Medicine

## 2021-07-04 ENCOUNTER — Ambulatory Visit: Payer: Medicare Other | Admitting: Internal Medicine

## 2021-07-04 ENCOUNTER — Encounter: Payer: Self-pay | Admitting: Internal Medicine

## 2021-07-04 VITALS — BP 108/60 | HR 64 | Ht 61.0 in | Wt 120.8 lb

## 2021-07-04 DIAGNOSIS — I495 Sick sinus syndrome: Secondary | ICD-10-CM | POA: Diagnosis not present

## 2021-07-04 NOTE — Patient Instructions (Addendum)
Medication Instructions:  Continue all current medications.  Labwork: none  Testing/Procedures: none  Follow-Up: 1 year - Dr.  Allred   Any Other Special Instructions Will Be Listed Below (If Applicable).   If you need a refill on your cardiac medications before your next appointment, please call your pharmacy.  

## 2021-07-04 NOTE — Progress Notes (Signed)
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  Since last being seen in our clinic, the patient reports doing very well.  Today, she denies symptoms of palpitations, exertional chest pain, shortness of breath,  lower extremity edema, dizziness, presyncope, or syncope.  The patient is otherwise without complaint today.   Past Medical History:  Diagnosis Date   Chronic pain    COPD with emphysema (HCC)    Depression    GERD (gastroesophageal reflux disease)    Hypertension    Mitral and aortic regurgitation    Mild   Neurodermatitis    Peptic ulcer    Sick sinus syndrome (HCC)    s/p PPM (SJM)   Syncope    Past Surgical History:  Procedure Laterality Date   APPENDECTOMY     BIOPSY  05/12/2019   Procedure: BIOPSY;  Surgeon: Rogene Houston, MD;  [facility];  Service: Endoscopy;;  antral, pre-pyloric mucosa   CARPAL TUNNEL RELEASE     CHOLECYSTECTOMY     COLONOSCOPY WITH PROPOFOL N/A 03/03/2019   Procedure: COLONOSCOPY WITH PROPOFOL;  Surgeon: Rogene Houston, MD;  [facility];  Service: Endoscopy;  Laterality: N/A;  9:50   ESOPHAGEAL DILATION N/A 01/15/2017   Procedure: ESOPHAGEAL DILATION;  Surgeon: Rogene Houston, MD;  [facility];  Service: Endoscopy;  Laterality: N/A;   ESOPHAGOGASTRODUODENOSCOPY (EGD) WITH PROPOFOL N/A 01/15/2017   Procedure: ESOPHAGOGASTRODUODENOSCOPY (EGD) WITH PROPOFOL;  Surgeon: Rogene Houston, MD;  [facility];  Service: Endoscopy;  Laterality: N/A;  9:15   ESOPHAGOGASTRODUODENOSCOPY (EGD) WITH PROPOFOL N/A 05/12/2019   Procedure: ESOPHAGOGASTRODUODENOSCOPY (EGD) WITH PROPOFOL;  Surgeon: Rogene Houston, MD;  [facility];  Service: Endoscopy;  Laterality: N/A;  169CV   NISSEN FUNDOPLICATION     PACEMAKER INSERTION     St Jude   PPM GENERATOR CHANGEOUT N/A 04/09/2020   Procedure: PPM  GENERATOR CHANGEOUT;  Surgeon: Thompson Grayer, MD;  Location: [place] [facility];  Service: Cardiovascular;  Laterality: N/A;   SHOULDER SURGERY     SPLENECTOMY      ROS- all systems are reviewed and negative except as per HPI above  Current Outpatient Medications  Medication Sig Dispense Refill   albuterol (VENTOLIN HFA) 108 (90 Base) MCG/ACT inhaler Inhale 2 puffs into the lungs every 6 (six) hours as needed for wheezing or shortness of breath.     ALPRAZolam (XANAX) 1 MG tablet Take 1 mg by mouth 2 (two) times daily.     Ascorbic Acid (VITAMIN C) 1000 MG tablet Take 1,000 mg by mouth daily.     b complex vitamins tablet Take 1 tablet by mouth daily.     Cholecalciferol (DIALYVITE VITAMIN D 5000) 125 MCG (5000 UT) capsule Take 5,000 Units by mouth daily.     dicyclomine (BENTYL) 10 MG capsule TAKE ONE CAPSULE BY MOUTH EVERY TWELVE HOURS AS NEEDED 60 capsule 11   ferrous sulfate 325 (65 FE) MG tablet Take 325 mg by mouth daily with breakfast.     folic acid (FOLVITE) 1 MG tablet Take 1 mg by mouth daily.  1   gabapentin (NEURONTIN) 400 MG capsule Take 400-800 mg by mouth See admin instructions. Take 1 capsule (400 mg) by mouth in the morning & take 2 capsules (800 mg) by mouth at night.     ibandronate (BONIVA)  150 MG tablet Take 150 mg by mouth every 30 (thirty) days.     ipratropium-albuterol (DUONEB) 0.5-2.5 (3) MG/3ML SOLN Take 3 mLs by nebulization every 4 (four) hours as needed (shortness of breath).     levothyroxine (SYNTHROID) 25 MCG tablet Take 25 mcg by mouth every morning.     Multiple Vitamins-Minerals (WOMENS MULTI PO) Take 1 capsule by mouth daily.     Omega-3 Fatty Acids (FISH OIL) 1000 MG CAPS Take 1,000 mg by mouth 3 (three) times daily.      omeprazole (PRILOSEC) 40 MG capsule Take 1 capsule by mouth once daily 90 capsule 3   Pramoxine-Camphor-Zinc Acetate (ANTI ITCH EX) Apply 1 application topically daily as needed (itching).     sertraline (ZOLOFT) 50 MG tablet Take 50  mg by mouth daily.     Simethicone 80 MG TABS Take 1 tablet (80 mg total) by mouth daily. 90 tablet 1   tiZANidine (ZANAFLEX) 2 MG tablet Take 2 mg by mouth 2 (two) times daily.     Vitamin A 2400 MCG (8000 UT) CAPS Take 2,400 mcg by mouth daily.     vitamin B-12 (CYANOCOBALAMIN) 50 MCG tablet Take 50 mcg by mouth daily.     VITAMIN E PO Take 1 capsule by mouth daily.     No current facility-administered medications for this visit.    Physical Exam: Vitals:   07/04/21 1027  BP: 108/60  Pulse: 64  SpO2: 97%  Weight: 120 lb 12.8 oz (54.8 kg)  Height: 5\' 1"  (1.549 m)    GEN- The patient is well appearing, alert and oriented x 3 today.   Head- normocephalic, atraumatic Eyes-  Sclera clear, conjunctiva pink Ears- hearing intact Oropharynx- clear Lungs- Clear to ausculation bilaterally, normal work of breathing Chest- pacemaker pocket is well healed Heart- Regular rate and rhythm,  GI- soft  Extremities- no clubbing, cyanosis, or edema  Pacemaker interrogation- reviewed in detail today,  See PACEART report  ekg tracing ordered today is personally reviewed and shows atrial paced  Assessment and Plan:  1. Symptomatic sinus bradycardia  Normal pacemaker function See Pace Art report No changes today she is not device dependant today  2. Tobacco Quit since 2014  Return in a year  Thompson Grayer MD, [facility] 07/04/2021 10:58 AM

## 2021-07-08 ENCOUNTER — Ambulatory Visit (INDEPENDENT_AMBULATORY_CARE_PROVIDER_SITE_OTHER): Payer: Medicare Other

## 2021-07-08 DIAGNOSIS — I495 Sick sinus syndrome: Secondary | ICD-10-CM

## 2021-07-08 LAB — CUP PACEART REMOTE DEVICE CHECK
Battery Remaining Longevity: 120 mo
Battery Remaining Percentage: 92 %
Battery Voltage: 3.01 V
Brady Statistic AP VP Percent: 1 %
Brady Statistic AP VS Percent: 63 %
Brady Statistic AS VP Percent: 1 %
Brady Statistic AS VS Percent: 36 %
Brady Statistic RA Percent Paced: 63 %
Brady Statistic RV Percent Paced: 1 %
Date Time Interrogation Session: 20221213020012
Implantable Lead Implant Date: 20100908
Implantable Lead Implant Date: 20100908
Implantable Lead Location: 753859
Implantable Lead Location: 753860
Implantable Lead Model: 1944
Implantable Lead Model: 1948
Implantable Pulse Generator Implant Date: 20210914
Lead Channel Impedance Value: 540 Ohm
Lead Channel Impedance Value: 600 Ohm
Lead Channel Pacing Threshold Amplitude: 0.5 V
Lead Channel Pacing Threshold Amplitude: 0.625 V
Lead Channel Pacing Threshold Pulse Width: 0.4 ms
Lead Channel Pacing Threshold Pulse Width: 0.5 ms
Lead Channel Sensing Intrinsic Amplitude: 11.1 mV
Lead Channel Sensing Intrinsic Amplitude: 3.4 mV
Lead Channel Setting Pacing Amplitude: 0.875
Lead Channel Setting Pacing Amplitude: 1.5 V
Lead Channel Setting Pacing Pulse Width: 0.4 ms
Lead Channel Setting Sensing Sensitivity: 4 mV
Pulse Gen Model: 2272
Pulse Gen Serial Number: 3863879

## 2021-07-18 NOTE — Progress Notes (Signed)
Remote pacemaker transmission.   

## 2021-07-25 DIAGNOSIS — I10 Essential (primary) hypertension: Secondary | ICD-10-CM | POA: Diagnosis not present

## 2021-07-25 DIAGNOSIS — F418 Other specified anxiety disorders: Secondary | ICD-10-CM | POA: Diagnosis not present

## 2021-07-25 DIAGNOSIS — M818 Other osteoporosis without current pathological fracture: Secondary | ICD-10-CM | POA: Diagnosis not present

## 2021-07-25 DIAGNOSIS — F330 Major depressive disorder, recurrent, mild: Secondary | ICD-10-CM | POA: Diagnosis not present

## 2021-09-11 DIAGNOSIS — I10 Essential (primary) hypertension: Secondary | ICD-10-CM | POA: Diagnosis not present

## 2021-09-11 DIAGNOSIS — Z0000 Encounter for general adult medical examination without abnormal findings: Secondary | ICD-10-CM | POA: Diagnosis not present

## 2021-09-11 DIAGNOSIS — M818 Other osteoporosis without current pathological fracture: Secondary | ICD-10-CM | POA: Diagnosis not present

## 2021-09-11 DIAGNOSIS — F330 Major depressive disorder, recurrent, mild: Secondary | ICD-10-CM | POA: Diagnosis not present

## 2021-09-11 DIAGNOSIS — F418 Other specified anxiety disorders: Secondary | ICD-10-CM | POA: Diagnosis not present

## 2021-09-23 ENCOUNTER — Ambulatory Visit: Payer: Medicare Other | Admitting: Cardiology

## 2021-09-23 NOTE — Progress Notes (Unsigned)
Cardiology Office Note  Date: 09/23/2021   ID: Rhiley, Tarver 1945/09/29, MRN [id number]  PCP:  Neale Burly, MD  Cardiologist:  Rozann Lesches, MD Electrophysiologist:  Thompson Grayer, MD   No chief complaint on file.   History of Present Illness: [name] is a 76 y.o. female last seen in October 2021 by Mr. Leonides Sake NP.  She has had interval follow-up with Dr. Rayann Heman, most recently December 2022.  She has a St. Jude pacemaker in place with history of sinus node dysfunction, followed by Dr. Rayann Heman.  Last device interrogation indicated normal function.  Past Medical History:  Diagnosis Date   Chronic pain    COPD with emphysema (HCC)    Depression    GERD (gastroesophageal reflux disease)    Hypertension    Mitral and aortic regurgitation    Mild   Neurodermatitis    Peptic ulcer    Sick sinus syndrome (HCC)    s/p PPM (SJM)   Syncope     Past Surgical History:  Procedure Laterality Date   APPENDECTOMY     BIOPSY  05/12/2019   Procedure: BIOPSY;  Surgeon: Rogene Houston, MD;  [facility];  Service: Endoscopy;;  antral, pre-pyloric mucosa   CARPAL TUNNEL RELEASE     CHOLECYSTECTOMY     COLONOSCOPY WITH PROPOFOL N/A 03/03/2019   Procedure: COLONOSCOPY WITH PROPOFOL;  Surgeon: Rogene Houston, MD;  [facility];  Service: Endoscopy;  Laterality: N/A;  9:50   ESOPHAGEAL DILATION N/A 01/15/2017   Procedure: ESOPHAGEAL DILATION;  Surgeon: Rogene Houston, MD;  [facility];  Service: Endoscopy;  Laterality: N/A;   ESOPHAGOGASTRODUODENOSCOPY (EGD) WITH PROPOFOL N/A 01/15/2017   Procedure: ESOPHAGOGASTRODUODENOSCOPY (EGD) WITH PROPOFOL;  Surgeon: Rogene Houston, MD;  [facility];  Service: Endoscopy;  Laterality: N/A;  9:15   ESOPHAGOGASTRODUODENOSCOPY (EGD) WITH PROPOFOL N/A 05/12/2019   Procedure: ESOPHAGOGASTRODUODENOSCOPY (EGD) WITH PROPOFOL;  Surgeon: Rogene Houston, MD;  [facility];  Service:  Endoscopy;  Laterality: N/A;  [id number]   NISSEN FUNDOPLICATION     PACEMAKER INSERTION     St Jude   PPM GENERATOR CHANGEOUT N/A 04/09/2020   Procedure: PPM GENERATOR CHANGEOUT;  Surgeon: Thompson Grayer, MD;  Location: [place] [facility];  Service: Cardiovascular;  Laterality: N/A;   SHOULDER SURGERY     SPLENECTOMY      Current Outpatient Medications  Medication Sig Dispense Refill   albuterol (VENTOLIN HFA) 108 (90 Base) MCG/ACT inhaler Inhale 2 puffs into the lungs every 6 (six) hours as needed for wheezing or shortness of breath.     ALPRAZolam (XANAX) 1 MG tablet Take 1 mg by mouth 2 (two) times daily.     Ascorbic Acid (VITAMIN C) 1000 MG tablet Take 1,000 mg by mouth daily.     b complex vitamins tablet Take 1 tablet by mouth daily.     Cholecalciferol (DIALYVITE VITAMIN D 5000) 125 MCG (5000 UT) capsule Take 5,000 Units by mouth daily.     dicyclomine (BENTYL) 10 MG capsule TAKE ONE CAPSULE BY MOUTH EVERY TWELVE HOURS AS NEEDED 60 capsule 11   ferrous sulfate 325 (65 FE) MG tablet Take 325 mg by mouth daily with breakfast.     folic acid (FOLVITE) 1 MG tablet Take 1 mg by mouth daily.  1   gabapentin (NEURONTIN) 400 MG capsule Take 400-800 mg by mouth See admin instructions. Take 1 capsule (400 mg) by mouth in  the morning & take 2 capsules (800 mg) by mouth at night.     ibandronate (BONIVA) 150 MG tablet Take 150 mg by mouth every 30 (thirty) days.     ipratropium-albuterol (DUONEB) 0.5-2.5 (3) MG/3ML SOLN Take 3 mLs by nebulization every 4 (four) hours as needed (shortness of breath).     levothyroxine (SYNTHROID) 25 MCG tablet Take 25 mcg by mouth every morning.     Multiple Vitamins-Minerals (WOMENS MULTI PO) Take 1 capsule by mouth daily.     Omega-3 Fatty Acids (FISH OIL) 1000 MG CAPS Take 1,000 mg by mouth 3 (three) times daily.      omeprazole (PRILOSEC) 40 MG capsule Take 1 capsule by mouth once daily 90 capsule 3   Pramoxine-Camphor-Zinc Acetate (ANTI ITCH EX) Apply 1  application topically daily as needed (itching).     sertraline (ZOLOFT) 50 MG tablet Take 50 mg by mouth daily.     Simethicone 80 MG TABS Take 1 tablet (80 mg total) by mouth daily. 90 tablet 1   tiZANidine (ZANAFLEX) 2 MG tablet Take 2 mg by mouth 2 (two) times daily.     Vitamin A 2400 MCG (8000 UT) CAPS Take 2,400 mcg by mouth daily.     vitamin B-12 (CYANOCOBALAMIN) 50 MCG tablet Take 50 mcg by mouth daily.     VITAMIN E PO Take 1 capsule by mouth daily.     No current facility-administered medications for this visit.   Allergies:  Patient has no known allergies.   Social History: The patient  reports that she quit smoking about 9 years ago. Her smoking use included cigarettes. She started smoking about 57 years ago. She has a 80.00 pack-year smoking history. She has never used smokeless tobacco. She reports that she does not drink alcohol and does not use drugs.   Family History: The patient's family history is not on file.   ROS:  Please see the history of present illness. Otherwise, complete review of systems is positive for {NONE DEFAULTED:18576}.  All other systems are reviewed and negative.   Physical Exam: VS:  There were no vitals taken for this visit., BMI There is no height or weight on file to calculate BMI.  Wt Readings from Last 3 Encounters:  07/04/21 120 lb 12.8 oz (54.8 kg)  09/02/20 126 lb (57.2 kg)  07/12/20 126 lb 12.8 oz (57.5 kg)    General: Patient appears comfortable at rest. HEENT: Conjunctiva and lids normal, oropharynx clear with moist mucosa. Neck: Supple, no elevated JVP or carotid bruits, no thyromegaly. Lungs: Clear to auscultation, nonlabored breathing at rest. Cardiac: Regular rate and rhythm, no S3 or significant systolic murmur, no pericardial rub. Abdomen: Soft, nontender, no hepatomegaly, bowel sounds present, no guarding or rebound. Extremities: No pitting edema, distal pulses 2+. Skin: Warm and dry. Musculoskeletal: No  kyphosis. Neuropsychiatric: Alert and oriented x3, affect grossly appropriate.  ECG:  An ECG dated 07/04/2021 was personally reviewed today and demonstrated:  Atrial paced rhythm.  Recent Labwork:  No recent lab work for review today.  Other Studies Reviewed Today:  Echocardiogram 05/08/2020:  1. Left ventricular ejection fraction, by estimation, is 55 to 60%. The  left ventricle has normal function. The left ventricle has no regional  wall motion abnormalities. Left ventricular diastolic parameters are  indeterminate.   2. Right ventricular systolic function is normal. The right ventricular  size is normal. There is normal pulmonary artery systolic pressure. The  estimated right ventricular systolic pressure is 62.9 mmHg.  3. The mitral valve is grossly normal. Trivial mitral valve  regurgitation.   4. The aortic valve is tricuspid. Aortic valve regurgitation is mild.  Mild to moderate aortic valve sclerosis/calcification is present, without  any evidence of aortic stenosis. Aortic regurgitation PHT measures 568  msec.   5. The inferior vena cava is normal in size with greater than 50%  respiratory variability, suggesting right atrial pressure of 3 mmHg.   Assessment and Plan:    Medication Adjustments/Labs and Tests Ordered: Current medicines are reviewed at length with the patient today.  Concerns regarding medicines are outlined above.   Tests Ordered: No orders of the defined types were placed in this encounter.   Medication Changes: No orders of the defined types were placed in this encounter.   Disposition:  Follow up {follow up:15908}  Signed, Satira Sark, MD, [facility] 09/23/2021 8:03 AM    [facility] [place], [place], [place] [address] Phone: [phone]; Fax: [phone]

## 2021-10-07 ENCOUNTER — Ambulatory Visit (INDEPENDENT_AMBULATORY_CARE_PROVIDER_SITE_OTHER): Payer: Medicare Other

## 2021-10-07 DIAGNOSIS — I495 Sick sinus syndrome: Secondary | ICD-10-CM

## 2021-10-07 LAB — CUP PACEART REMOTE DEVICE CHECK
Battery Remaining Longevity: 114 mo
Battery Remaining Percentage: 90 %
Battery Voltage: 3.01 V
Brady Statistic AP VP Percent: 1 %
Brady Statistic AP VS Percent: 67 %
Brady Statistic AS VP Percent: 1 %
Brady Statistic AS VS Percent: 32 %
Brady Statistic RA Percent Paced: 68 %
Brady Statistic RV Percent Paced: 1 %
Date Time Interrogation Session: 20230314020015
Implantable Lead Implant Date: 20100908
Implantable Lead Implant Date: 20100908
Implantable Lead Location: 753859
Implantable Lead Location: 753860
Implantable Lead Model: 1944
Implantable Lead Model: 1948
Implantable Pulse Generator Implant Date: 20210914
Lead Channel Impedance Value: 490 Ohm
Lead Channel Impedance Value: 550 Ohm
Lead Channel Pacing Threshold Amplitude: 0.5 V
Lead Channel Pacing Threshold Amplitude: 0.625 V
Lead Channel Pacing Threshold Pulse Width: 0.4 ms
Lead Channel Pacing Threshold Pulse Width: 0.5 ms
Lead Channel Sensing Intrinsic Amplitude: 12 mV
Lead Channel Sensing Intrinsic Amplitude: 3.3 mV
Lead Channel Setting Pacing Amplitude: 0.875
Lead Channel Setting Pacing Amplitude: 1.5 V
Lead Channel Setting Pacing Pulse Width: 0.4 ms
Lead Channel Setting Sensing Sensitivity: 4 mV
Pulse Gen Model: 2272
Pulse Gen Serial Number: 3863879

## 2021-10-21 NOTE — Progress Notes (Signed)
Remote pacemaker transmission.   

## 2021-11-18 DIAGNOSIS — L280 Lichen simplex chronicus: Secondary | ICD-10-CM | POA: Diagnosis not present

## 2021-11-30 ENCOUNTER — Other Ambulatory Visit (INDEPENDENT_AMBULATORY_CARE_PROVIDER_SITE_OTHER): Payer: Self-pay | Admitting: Gastroenterology

## 2021-11-30 DIAGNOSIS — G8929 Other chronic pain: Secondary | ICD-10-CM

## 2021-12-09 ENCOUNTER — Other Ambulatory Visit (INDEPENDENT_AMBULATORY_CARE_PROVIDER_SITE_OTHER): Payer: Self-pay | Admitting: Gastroenterology

## 2021-12-09 DIAGNOSIS — K219 Gastro-esophageal reflux disease without esophagitis: Secondary | ICD-10-CM

## 2021-12-11 DIAGNOSIS — E038 Other specified hypothyroidism: Secondary | ICD-10-CM | POA: Diagnosis not present

## 2021-12-11 DIAGNOSIS — F330 Major depressive disorder, recurrent, mild: Secondary | ICD-10-CM | POA: Diagnosis not present

## 2021-12-11 DIAGNOSIS — I10 Essential (primary) hypertension: Secondary | ICD-10-CM | POA: Diagnosis not present

## 2021-12-11 DIAGNOSIS — M818 Other osteoporosis without current pathological fracture: Secondary | ICD-10-CM | POA: Diagnosis not present

## 2021-12-16 DIAGNOSIS — L280 Lichen simplex chronicus: Secondary | ICD-10-CM | POA: Diagnosis not present

## 2021-12-24 NOTE — Progress Notes (Unsigned)
Cardiology Office Note  Date: 12/25/2021   ID: Breeze, Berringer 1946/01/18, MRN [id number]  PCP:  Neale Burly, MD  Cardiologist:  Rozann Lesches, MD Electrophysiologist:  Thompson Grayer, MD   Chief Complaint  Patient presents with   Cardiac follow-up    History of Present Illness: [name] is a 76 y.o. female last assessed via telehealth encounter in September 2020.  She presents for a follow-up visit.  She does not describe any dizziness or syncope, continues on stable medical regimen as noted below and follows up with Dr. Sherrie Sport for primary care.  I personally reviewed her interval ECG from December 2022 as noted below.  St, Jude pacemaker in place with follow-up by Dr. Rayann Heman.  Last device interrogation indicated normal function.  Past Medical History:  Diagnosis Date   Chronic pain    COPD with emphysema (HCC)    Depression    GERD (gastroesophageal reflux disease)    Hypertension    Mitral and aortic regurgitation    Mild   Neurodermatitis    Peptic ulcer    Sick sinus syndrome (HCC)    s/p PPM (SJM)   Syncope     Past Surgical History:  Procedure Laterality Date   APPENDECTOMY     BIOPSY  05/12/2019   Procedure: BIOPSY;  Surgeon: Rogene Houston, MD;  [facility];  Service: Endoscopy;;  antral, pre-pyloric mucosa   CARPAL TUNNEL RELEASE     CHOLECYSTECTOMY     COLONOSCOPY WITH PROPOFOL N/A 03/03/2019   Procedure: COLONOSCOPY WITH PROPOFOL;  Surgeon: Rogene Houston, MD;  [facility];  Service: Endoscopy;  Laterality: N/A;  9:50   ESOPHAGEAL DILATION N/A 01/15/2017   Procedure: ESOPHAGEAL DILATION;  Surgeon: Rogene Houston, MD;  [facility];  Service: Endoscopy;  Laterality: N/A;   ESOPHAGOGASTRODUODENOSCOPY (EGD) WITH PROPOFOL N/A 01/15/2017   Procedure: ESOPHAGOGASTRODUODENOSCOPY (EGD) WITH PROPOFOL;  Surgeon: Rogene Houston, MD;  [facility];  Service: Endoscopy;  Laterality: N/A;  9:15    ESOPHAGOGASTRODUODENOSCOPY (EGD) WITH PROPOFOL N/A 05/12/2019   Procedure: ESOPHAGOGASTRODUODENOSCOPY (EGD) WITH PROPOFOL;  Surgeon: Rogene Houston, MD;  [facility];  Service: Endoscopy;  Laterality: N/A;  [id number]   NISSEN FUNDOPLICATION     PACEMAKER INSERTION     St Jude   PPM GENERATOR CHANGEOUT N/A 04/09/2020   Procedure: PPM GENERATOR CHANGEOUT;  Surgeon: Thompson Grayer, MD;  Location: [place] [facility];  Service: Cardiovascular;  Laterality: N/A;   SHOULDER SURGERY     SPLENECTOMY      Current Outpatient Medications  Medication Sig Dispense Refill   albuterol (VENTOLIN HFA) 108 (90 Base) MCG/ACT inhaler Inhale 2 puffs into the lungs every 6 (six) hours as needed for wheezing or shortness of breath.     ALPRAZolam (XANAX) 1 MG tablet Take 1 mg by mouth 2 (two) times daily.     Ascorbic Acid (VITAMIN C) 1000 MG tablet Take 1,000 mg by mouth daily.     b complex vitamins tablet Take 1 tablet by mouth daily.     Cholecalciferol (DIALYVITE VITAMIN D 5000) 125 MCG (5000 UT) capsule Take 5,000 Units by mouth daily.     dicyclomine (BENTYL) 10 MG capsule TAKE ONE CAPSULE BY MOUTH EVERY TWELVE HOURS AS NEEDED 60 capsule 11   folic acid (FOLVITE) 1 MG tablet Take 1 mg by mouth daily.  1   gabapentin (NEURONTIN) 400 MG capsule Take 400-800 mg by mouth  See admin instructions. Take 1 capsule (400 mg) by mouth in the morning & take 2 capsules (800 mg) by mouth at night.     ibandronate (BONIVA) 150 MG tablet Take 150 mg by mouth every 30 (thirty) days.     ipratropium-albuterol (DUONEB) 0.5-2.5 (3) MG/3ML SOLN Take 3 mLs by nebulization every 4 (four) hours as needed (shortness of breath).     levothyroxine (SYNTHROID) 25 MCG tablet Take 25 mcg by mouth every morning.     Multiple Vitamins-Minerals (WOMENS MULTI PO) Take 1 capsule by mouth daily.     Omega-3 Fatty Acids (FISH OIL) 1000 MG CAPS Take 1,000 mg by mouth 3 (three) times daily.      omeprazole (PRILOSEC) 40 MG capsule Take 1  capsule by mouth once daily 90 capsule 3   Pramoxine-Camphor-Zinc Acetate (ANTI ITCH EX) Apply 1 application topically daily as needed (itching).     sertraline (ZOLOFT) 50 MG tablet Take 50 mg by mouth daily.     Vitamin A 2400 MCG (8000 UT) CAPS Take 2,400 mcg by mouth daily.     vitamin B-12 (CYANOCOBALAMIN) 50 MCG tablet Take 50 mcg by mouth daily.     VITAMIN E PO Take 1 capsule by mouth daily.     ferrous sulfate 325 (65 FE) MG tablet Take 325 mg by mouth daily with breakfast. (Patient not taking: Reported on 12/25/2021)     Simethicone 80 MG TABS Take 1 tablet (80 mg total) by mouth daily. (Patient not taking: Reported on 12/25/2021) 90 tablet 1   tiZANidine (ZANAFLEX) 2 MG tablet Take 2 mg by mouth 2 (two) times daily. (Patient not taking: Reported on 12/25/2021)     No current facility-administered medications for this visit.   Allergies:  Patient has no known allergies.   ROS:  No syncope.  Physical Exam: VS:  BP 118/62   Pulse 61   Ht '5\' 2"'$  (1.575 m)   Wt 122 lb (55.3 kg)   SpO2 98%   BMI 22.31 kg/m , BMI Body mass index is 22.31 kg/m.  Wt Readings from Last 3 Encounters:  12/25/21 122 lb (55.3 kg)  07/04/21 120 lb 12.8 oz (54.8 kg)  09/02/20 126 lb (57.2 kg)    General: Patient appears comfortable at rest. HEENT: Conjunctiva and lids normal. Neck: Supple, no elevated JVP or carotid bruits, no thyromegaly. Lungs: Clear to auscultation, nonlabored breathing at rest. Cardiac: Regular rate and rhythm, no S3, 1/6 systolic murmur. Extremities: No pitting edema.  ECG:  An ECG dated 07/04/2021 was personally reviewed today and demonstrated:  Atrial pacing.Marland Kitchen  Recent Labwork:  No interval lab work for review today.  Other Studies Reviewed Today:  Echocardiogram 05/08/2020:  1. Left ventricular ejection fraction, by estimation, is 55 to 60%. The  left ventricle has normal function. The left ventricle has no regional  wall motion abnormalities. Left ventricular diastolic  parameters are  indeterminate.   2. Right ventricular systolic function is normal. The right ventricular  size is normal. There is normal pulmonary artery systolic pressure. The  estimated right ventricular systolic pressure is 62.8 mmHg.   3. The mitral valve is grossly normal. Trivial mitral valve  regurgitation.   4. The aortic valve is tricuspid. Aortic valve regurgitation is mild.  Mild to moderate aortic valve sclerosis/calcification is present, without  any evidence of aortic stenosis. Aortic regurgitation PHT measures 568  msec.   5. The inferior vena cava is normal in size with greater than 50%  respiratory  variability, suggesting right atrial pressure of 3 mmHg.   Assessment and Plan:  1.  Sick sinus syndrome with St. Jude pacemaker in place.  Keep follow-up with Dr. Rayann Heman.  Recent device interrogation indicated normal function.  2.  History of essential hypertension.  Today's blood pressure is normal.  She is currently not on any antihypertensive medications and continues to follow with Dr. Sherrie Sport.  Medication Adjustments/Labs and Tests Ordered: Current medicines are reviewed at length with the patient today.  Concerns regarding medicines are outlined above.   Tests Ordered: No orders of the defined types were placed in this encounter.   Medication Changes: No orders of the defined types were placed in this encounter.   Disposition:  Follow up  1 year.  Signed, Satira Sark, MD, [facility] 12/25/2021 10:46 AM    [facility] [place], [place], [place] [address] Phone: [phone]; Fax: [phone]

## 2021-12-25 ENCOUNTER — Encounter: Payer: Self-pay | Admitting: Cardiology

## 2021-12-25 ENCOUNTER — Ambulatory Visit (INDEPENDENT_AMBULATORY_CARE_PROVIDER_SITE_OTHER): Payer: Medicare Other | Admitting: Cardiology

## 2021-12-25 VITALS — BP 118/62 | HR 61 | Ht 62.0 in | Wt 122.0 lb

## 2021-12-25 DIAGNOSIS — I495 Sick sinus syndrome: Secondary | ICD-10-CM | POA: Diagnosis not present

## 2021-12-25 NOTE — Patient Instructions (Signed)
Medication Instructions:  ?Your physician recommends that you continue on your current medications as directed. Please refer to the Current Medication list given to you today.  ? ?Labwork: ?none ? ?Testing/Procedures: ?none ? ?Follow-Up: ?Your physician recommends that you schedule a follow-up appointment in: 1 year ? ?Any Other Special Instructions Will Be Listed Below (If Applicable). ? ?You will receive a reminder call in about 10 months reminding you to schedule your appointment. If you don't receive this call, please contact our office. ? ?If you need a refill on your cardiac medications before your next appointment, please call your pharmacy. ? ?

## 2022-01-06 ENCOUNTER — Ambulatory Visit (INDEPENDENT_AMBULATORY_CARE_PROVIDER_SITE_OTHER): Payer: Medicare Other

## 2022-01-06 DIAGNOSIS — I495 Sick sinus syndrome: Secondary | ICD-10-CM | POA: Diagnosis not present

## 2022-01-06 LAB — CUP PACEART REMOTE DEVICE CHECK
Battery Remaining Longevity: 113 mo
Battery Remaining Percentage: 88 %
Battery Voltage: 3.01 V
Brady Statistic AP VP Percent: 1 %
Brady Statistic AP VS Percent: 67 %
Brady Statistic AS VP Percent: 1 %
Brady Statistic AS VS Percent: 33 %
Brady Statistic RA Percent Paced: 67 %
Brady Statistic RV Percent Paced: 1 %
Date Time Interrogation Session: 20230613020013
Implantable Lead Implant Date: 20100908
Implantable Lead Implant Date: 20100908
Implantable Lead Location: 753859
Implantable Lead Location: 753860
Implantable Lead Model: 1944
Implantable Lead Model: 1948
Implantable Pulse Generator Implant Date: 20210914
Lead Channel Impedance Value: 530 Ohm
Lead Channel Impedance Value: 540 Ohm
Lead Channel Pacing Threshold Amplitude: 0.5 V
Lead Channel Pacing Threshold Amplitude: 0.625 V
Lead Channel Pacing Threshold Pulse Width: 0.4 ms
Lead Channel Pacing Threshold Pulse Width: 0.5 ms
Lead Channel Sensing Intrinsic Amplitude: 11.2 mV
Lead Channel Sensing Intrinsic Amplitude: 3.5 mV
Lead Channel Setting Pacing Amplitude: 0.875
Lead Channel Setting Pacing Amplitude: 1.5 V
Lead Channel Setting Pacing Pulse Width: 0.4 ms
Lead Channel Setting Sensing Sensitivity: 4 mV
Pulse Gen Model: 2272
Pulse Gen Serial Number: 3863879

## 2022-01-21 NOTE — Progress Notes (Signed)
Remote pacemaker transmission.   

## 2022-03-19 DIAGNOSIS — E038 Other specified hypothyroidism: Secondary | ICD-10-CM | POA: Diagnosis not present

## 2022-03-19 DIAGNOSIS — F330 Major depressive disorder, recurrent, mild: Secondary | ICD-10-CM | POA: Diagnosis not present

## 2022-03-19 DIAGNOSIS — I10 Essential (primary) hypertension: Secondary | ICD-10-CM | POA: Diagnosis not present

## 2022-03-19 DIAGNOSIS — Z0000 Encounter for general adult medical examination without abnormal findings: Secondary | ICD-10-CM | POA: Diagnosis not present

## 2022-03-19 DIAGNOSIS — M818 Other osteoporosis without current pathological fracture: Secondary | ICD-10-CM | POA: Diagnosis not present

## 2022-04-07 ENCOUNTER — Ambulatory Visit (INDEPENDENT_AMBULATORY_CARE_PROVIDER_SITE_OTHER): Payer: Medicare Other

## 2022-04-07 DIAGNOSIS — I495 Sick sinus syndrome: Secondary | ICD-10-CM

## 2022-04-10 LAB — CUP PACEART REMOTE DEVICE CHECK
Battery Remaining Longevity: 108 mo
Battery Remaining Percentage: 85 %
Battery Voltage: 3.01 V
Brady Statistic AP VP Percent: 1 %
Brady Statistic AP VS Percent: 67 %
Brady Statistic AS VP Percent: 1 %
Brady Statistic AS VS Percent: 32 %
Brady Statistic RA Percent Paced: 68 %
Brady Statistic RV Percent Paced: 1 %
Date Time Interrogation Session: 20230912020014
Implantable Lead Implant Date: 20100908
Implantable Lead Implant Date: 20100908
Implantable Lead Location: 753859
Implantable Lead Location: 753860
Implantable Lead Model: 1944
Implantable Lead Model: 1948
Implantable Pulse Generator Implant Date: 20210914
Lead Channel Impedance Value: 450 Ohm
Lead Channel Impedance Value: 510 Ohm
Lead Channel Pacing Threshold Amplitude: 0.5 V
Lead Channel Pacing Threshold Amplitude: 0.5 V
Lead Channel Pacing Threshold Pulse Width: 0.4 ms
Lead Channel Pacing Threshold Pulse Width: 0.5 ms
Lead Channel Sensing Intrinsic Amplitude: 2.6 mV
Lead Channel Sensing Intrinsic Amplitude: 9.7 mV
Lead Channel Setting Pacing Amplitude: 0.75 V
Lead Channel Setting Pacing Amplitude: 1.5 V
Lead Channel Setting Pacing Pulse Width: 0.4 ms
Lead Channel Setting Sensing Sensitivity: 4 mV
Pulse Gen Model: 2272
Pulse Gen Serial Number: 3863879

## 2022-04-16 DIAGNOSIS — H43813 Vitreous degeneration, bilateral: Secondary | ICD-10-CM | POA: Diagnosis not present

## 2022-04-23 NOTE — Progress Notes (Signed)
Remote pacemaker transmission.   

## 2022-06-25 DIAGNOSIS — F418 Other specified anxiety disorders: Secondary | ICD-10-CM | POA: Diagnosis not present

## 2022-06-25 DIAGNOSIS — M818 Other osteoporosis without current pathological fracture: Secondary | ICD-10-CM | POA: Diagnosis not present

## 2022-06-25 DIAGNOSIS — F330 Major depressive disorder, recurrent, mild: Secondary | ICD-10-CM | POA: Diagnosis not present

## 2022-06-25 DIAGNOSIS — I10 Essential (primary) hypertension: Secondary | ICD-10-CM | POA: Diagnosis not present

## 2022-06-26 ENCOUNTER — Encounter: Payer: Medicare Other | Admitting: Internal Medicine

## 2022-07-07 ENCOUNTER — Ambulatory Visit (INDEPENDENT_AMBULATORY_CARE_PROVIDER_SITE_OTHER): Payer: Medicare Other

## 2022-07-07 DIAGNOSIS — I495 Sick sinus syndrome: Secondary | ICD-10-CM

## 2022-07-07 LAB — CUP PACEART REMOTE DEVICE CHECK
Battery Remaining Longevity: 104 mo
Battery Remaining Percentage: 83 %
Battery Voltage: 3.01 V
Brady Statistic AP VP Percent: 1 %
Brady Statistic AP VS Percent: 66 %
Brady Statistic AS VP Percent: 1 %
Brady Statistic AS VS Percent: 33 %
Brady Statistic RA Percent Paced: 67 %
Brady Statistic RV Percent Paced: 1 %
Date Time Interrogation Session: 20231212020013
Implantable Lead Connection Status: 753985
Implantable Lead Connection Status: 753985
Implantable Lead Implant Date: 20100908
Implantable Lead Implant Date: 20100908
Implantable Lead Location: 753859
Implantable Lead Location: 753860
Implantable Lead Model: 1944
Implantable Lead Model: 1948
Implantable Pulse Generator Implant Date: 20210914
Lead Channel Impedance Value: 460 Ohm
Lead Channel Impedance Value: 480 Ohm
Lead Channel Pacing Threshold Amplitude: 0.625 V
Lead Channel Pacing Threshold Amplitude: 0.625 V
Lead Channel Pacing Threshold Pulse Width: 0.4 ms
Lead Channel Pacing Threshold Pulse Width: 0.5 ms
Lead Channel Sensing Intrinsic Amplitude: 10 mV
Lead Channel Sensing Intrinsic Amplitude: 2.6 mV
Lead Channel Setting Pacing Amplitude: 0.875
Lead Channel Setting Pacing Amplitude: 1.625
Lead Channel Setting Pacing Pulse Width: 0.4 ms
Lead Channel Setting Sensing Sensitivity: 4 mV
Pulse Gen Model: 2272
Pulse Gen Serial Number: 3863879

## 2022-08-05 NOTE — Progress Notes (Signed)
Remote pacemaker transmission.   

## 2022-08-11 DIAGNOSIS — R102 Pelvic and perineal pain: Secondary | ICD-10-CM | POA: Diagnosis not present

## 2022-08-11 DIAGNOSIS — M25551 Pain in right hip: Secondary | ICD-10-CM | POA: Diagnosis not present

## 2022-08-11 DIAGNOSIS — M160 Bilateral primary osteoarthritis of hip: Secondary | ICD-10-CM | POA: Diagnosis not present

## 2022-08-11 DIAGNOSIS — R52 Pain, unspecified: Secondary | ICD-10-CM | POA: Diagnosis not present

## 2022-08-11 DIAGNOSIS — S300XXS Contusion of lower back and pelvis, sequela: Secondary | ICD-10-CM | POA: Diagnosis not present

## 2022-08-11 DIAGNOSIS — Z6823 Body mass index (BMI) 23.0-23.9, adult: Secondary | ICD-10-CM | POA: Diagnosis not present

## 2022-09-10 ENCOUNTER — Ambulatory Visit: Payer: Medicare Other | Attending: Internal Medicine | Admitting: Cardiovascular Disease

## 2022-09-10 ENCOUNTER — Encounter: Payer: Self-pay | Admitting: Cardiovascular Disease

## 2022-09-10 VITALS — BP 140/88 | HR 70 | Ht 62.0 in | Wt 130.6 lb

## 2022-09-10 DIAGNOSIS — I495 Sick sinus syndrome: Secondary | ICD-10-CM | POA: Diagnosis not present

## 2022-09-10 LAB — CUP PACEART INCLINIC DEVICE CHECK
Battery Remaining Longevity: 104 mo
Battery Voltage: 3.01 V
Brady Statistic RA Percent Paced: 65 %
Brady Statistic RV Percent Paced: 0.55 %
Date Time Interrogation Session: 20240215135542
Implantable Lead Connection Status: 753985
Implantable Lead Connection Status: 753985
Implantable Lead Implant Date: 20100908
Implantable Lead Implant Date: 20100908
Implantable Lead Location: 753859
Implantable Lead Location: 753860
Implantable Lead Model: 1944
Implantable Lead Model: 1948
Implantable Pulse Generator Implant Date: 20210914
Lead Channel Impedance Value: 487.5 Ohm
Lead Channel Impedance Value: 487.5 Ohm
Lead Channel Pacing Threshold Amplitude: 0.75 V
Lead Channel Pacing Threshold Amplitude: 0.75 V
Lead Channel Pacing Threshold Amplitude: 0.75 V
Lead Channel Pacing Threshold Amplitude: 0.75 V
Lead Channel Pacing Threshold Pulse Width: 0.4 ms
Lead Channel Pacing Threshold Pulse Width: 0.4 ms
Lead Channel Pacing Threshold Pulse Width: 0.5 ms
Lead Channel Pacing Threshold Pulse Width: 0.5 ms
Lead Channel Sensing Intrinsic Amplitude: 10.3 mV
Lead Channel Sensing Intrinsic Amplitude: 2.7 mV
Lead Channel Setting Pacing Amplitude: 0.75 V
Lead Channel Setting Pacing Amplitude: 1.5 V
Lead Channel Setting Pacing Pulse Width: 0.4 ms
Lead Channel Setting Sensing Sensitivity: 4 mV
Pulse Gen Model: 2272
Pulse Gen Serial Number: 3863879

## 2022-09-10 NOTE — Patient Instructions (Signed)
Medication Instructions:  Continue all current medications.  Labwork: none  Testing/Procedures: none  Follow-Up: 1 year - Dr.  Mealor    Any Other Special Instructions Will Be Listed Below (If Applicable).   If you need a refill on your cardiac medications before your next appointment, please call your pharmacy.  

## 2022-09-10 NOTE — Progress Notes (Signed)
PCP: Neale Burly, MD   Primary EP:  Dr Myles Gip  [name] is a 77 y.o. female who presents today for routine electrophysiology followup.  Since last being seen in our clinic, the patient reports doing very well.  Today, she denies symptoms of palpitations, exertional chest pain, shortness of breath,  lower extremity edema, dizziness, presyncope, or syncope.  The patient is otherwise without complaint today.   Past Medical History:  Diagnosis Date   Chronic pain    COPD with emphysema (HCC)    Depression    GERD (gastroesophageal reflux disease)    Hypertension    Mitral and aortic regurgitation    Mild   Neurodermatitis    Peptic ulcer    Sick sinus syndrome (HCC)    s/p PPM (SJM)   Syncope    Past Surgical History:  Procedure Laterality Date   APPENDECTOMY     BIOPSY  05/12/2019   Procedure: BIOPSY;  Surgeon: Rogene Houston, MD;  [facility];  Service: Endoscopy;;  antral, pre-pyloric mucosa   CARPAL TUNNEL RELEASE     CHOLECYSTECTOMY     COLONOSCOPY WITH PROPOFOL N/A 03/03/2019   Procedure: COLONOSCOPY WITH PROPOFOL;  Surgeon: Rogene Houston, MD;  [facility];  Service: Endoscopy;  Laterality: N/A;  9:50   ESOPHAGEAL DILATION N/A 01/15/2017   Procedure: ESOPHAGEAL DILATION;  Surgeon: Rogene Houston, MD;  [facility];  Service: Endoscopy;  Laterality: N/A;   ESOPHAGOGASTRODUODENOSCOPY (EGD) WITH PROPOFOL N/A 01/15/2017   Procedure: ESOPHAGOGASTRODUODENOSCOPY (EGD) WITH PROPOFOL;  Surgeon: Rogene Houston, MD;  [facility];  Service: Endoscopy;  Laterality: N/A;  9:15   ESOPHAGOGASTRODUODENOSCOPY (EGD) WITH PROPOFOL N/A 05/12/2019   Procedure: ESOPHAGOGASTRODUODENOSCOPY (EGD) WITH PROPOFOL;  Surgeon: Rogene Houston, MD;  [facility];  Service: Endoscopy;  Laterality: N/A;  [id number]   NISSEN FUNDOPLICATION     PACEMAKER INSERTION     St Jude   PPM GENERATOR CHANGEOUT N/A 04/09/2020   Procedure: PPM  GENERATOR CHANGEOUT;  Surgeon: Thompson Grayer, MD;  Location: [place] [facility];  Service: Cardiovascular;  Laterality: N/A;   SHOULDER SURGERY     SPLENECTOMY      ROS- all systems are reviewed and negative except as per HPI above  Current Outpatient Medications  Medication Sig Dispense Refill   albuterol (VENTOLIN HFA) 108 (90 Base) MCG/ACT inhaler Inhale 2 puffs into the lungs every 6 (six) hours as needed for wheezing or shortness of breath.     ALPRAZolam (XANAX) 1 MG tablet Take 1 mg by mouth 2 (two) times daily.     Ascorbic Acid (VITAMIN C) 1000 MG tablet Take 1,000 mg by mouth daily.     b complex vitamins tablet Take 1 tablet by mouth daily.     Cholecalciferol (DIALYVITE VITAMIN D 5000) 125 MCG (5000 UT) capsule Take 5,000 Units by mouth daily.     dicyclomine (BENTYL) 10 MG capsule TAKE ONE CAPSULE BY MOUTH EVERY TWELVE HOURS AS NEEDED 60 capsule 11   ferrous sulfate 325 (65 FE) MG tablet Take 325 mg by mouth daily with breakfast.     folic acid (FOLVITE) 1 MG tablet Take 1 mg by mouth daily.  1   gabapentin (NEURONTIN) 400 MG capsule Take 400-800 mg by mouth See admin instructions. Take 1 capsule (400 mg) by mouth in the morning & take 2 capsules (800 mg) by mouth at night.     ibandronate (BONIVA)  150 MG tablet Take 150 mg by mouth every 30 (thirty) days.     ipratropium-albuterol (DUONEB) 0.5-2.5 (3) MG/3ML SOLN Take 3 mLs by nebulization every 4 (four) hours as needed (shortness of breath).     levothyroxine (SYNTHROID) 25 MCG tablet Take 25 mcg by mouth every morning.     Multiple Vitamins-Minerals (WOMENS MULTI PO) Take 1 capsule by mouth daily.     Omega-3 Fatty Acids (FISH OIL) 1000 MG CAPS Take 1,000 mg by mouth 3 (three) times daily.      omeprazole (PRILOSEC) 40 MG capsule Take 1 capsule by mouth once daily 90 capsule 3   Pramoxine-Camphor-Zinc Acetate (ANTI ITCH EX) Apply 1 application topically daily as needed (itching).     sertraline (ZOLOFT) 50 MG tablet Take 50  mg by mouth daily.     Simethicone 80 MG TABS Take 1 tablet (80 mg total) by mouth daily. 90 tablet 1   tiZANidine (ZANAFLEX) 2 MG tablet Take 2 mg by mouth 2 (two) times daily.     Vitamin A 2400 MCG (8000 UT) CAPS Take 2,400 mcg by mouth daily.     vitamin B-12 (CYANOCOBALAMIN) 50 MCG tablet Take 50 mcg by mouth daily.     VITAMIN E PO Take 1 capsule by mouth daily.     No current facility-administered medications for this visit.    Physical Exam: Vitals:   09/10/22 1150  BP: (!) 140/88  Pulse: 70  SpO2: 94%  Weight: 130 lb 9.6 oz (59.2 kg)  Height: 5' 2"$  (1.575 m)   Gen: Appears comfortable, well-nourished CV: RRR, no dependent edema  Pulm: breathing easily The device site is normal -- no tenderness, edema, drainage, redness, threatened erosion.   Pacemaker interrogation- reviewed in detail today,  See PACEART report  ekg tracing ordered today is personally reviewed and shows atrial paced  Assessment and Plan:  1. Symptomatic sinus bradycardia  Normal pacemaker function See Pace Art report No changes today she is not device dependant today  2. Tobacco Quit since 2014  Return in a year  Melissa Quitter, MD 09/10/2022 11:54 AM

## 2022-09-14 DIAGNOSIS — H26491 Other secondary cataract, right eye: Secondary | ICD-10-CM | POA: Diagnosis not present

## 2022-09-22 DIAGNOSIS — M818 Other osteoporosis without current pathological fracture: Secondary | ICD-10-CM | POA: Diagnosis not present

## 2022-09-22 DIAGNOSIS — Z0000 Encounter for general adult medical examination without abnormal findings: Secondary | ICD-10-CM | POA: Diagnosis not present

## 2022-09-22 DIAGNOSIS — F330 Major depressive disorder, recurrent, mild: Secondary | ICD-10-CM | POA: Diagnosis not present

## 2022-09-22 DIAGNOSIS — K219 Gastro-esophageal reflux disease without esophagitis: Secondary | ICD-10-CM | POA: Diagnosis not present

## 2022-09-22 DIAGNOSIS — J449 Chronic obstructive pulmonary disease, unspecified: Secondary | ICD-10-CM | POA: Diagnosis not present

## 2022-10-06 ENCOUNTER — Ambulatory Visit (INDEPENDENT_AMBULATORY_CARE_PROVIDER_SITE_OTHER): Payer: Medicare Other

## 2022-10-06 DIAGNOSIS — I495 Sick sinus syndrome: Secondary | ICD-10-CM

## 2022-10-08 LAB — CUP PACEART REMOTE DEVICE CHECK
Battery Remaining Longevity: 101 mo
Battery Remaining Percentage: 81 %
Battery Voltage: 3.01 V
Brady Statistic AP VP Percent: 1 %
Brady Statistic AP VS Percent: 62 %
Brady Statistic AS VP Percent: 1 %
Brady Statistic AS VS Percent: 38 %
Brady Statistic RA Percent Paced: 62 %
Brady Statistic RV Percent Paced: 1 %
Date Time Interrogation Session: 20240312020012
Implantable Lead Connection Status: 753985
Implantable Lead Connection Status: 753985
Implantable Lead Implant Date: 20100908
Implantable Lead Implant Date: 20100908
Implantable Lead Location: 753859
Implantable Lead Location: 753860
Implantable Lead Model: 1944
Implantable Lead Model: 1948
Implantable Pulse Generator Implant Date: 20210914
Lead Channel Impedance Value: 440 Ohm
Lead Channel Impedance Value: 490 Ohm
Lead Channel Pacing Threshold Amplitude: 0.5 V
Lead Channel Pacing Threshold Amplitude: 0.5 V
Lead Channel Pacing Threshold Pulse Width: 0.4 ms
Lead Channel Pacing Threshold Pulse Width: 0.5 ms
Lead Channel Sensing Intrinsic Amplitude: 2.4 mV
Lead Channel Sensing Intrinsic Amplitude: 9.7 mV
Lead Channel Setting Pacing Amplitude: 0.75 V
Lead Channel Setting Pacing Amplitude: 1.5 V
Lead Channel Setting Pacing Pulse Width: 0.4 ms
Lead Channel Setting Sensing Sensitivity: 4 mV
Pulse Gen Model: 2272
Pulse Gen Serial Number: 3863879

## 2022-10-12 DIAGNOSIS — L0100 Impetigo, unspecified: Secondary | ICD-10-CM | POA: Diagnosis not present

## 2022-10-27 DIAGNOSIS — D485 Neoplasm of uncertain behavior of skin: Secondary | ICD-10-CM | POA: Diagnosis not present

## 2022-10-27 DIAGNOSIS — L309 Dermatitis, unspecified: Secondary | ICD-10-CM | POA: Diagnosis not present

## 2022-11-03 DIAGNOSIS — L309 Dermatitis, unspecified: Secondary | ICD-10-CM | POA: Diagnosis not present

## 2022-11-05 DIAGNOSIS — Z6823 Body mass index (BMI) 23.0-23.9, adult: Secondary | ICD-10-CM | POA: Diagnosis not present

## 2022-11-05 DIAGNOSIS — L279 Dermatitis due to unspecified substance taken internally: Secondary | ICD-10-CM | POA: Diagnosis not present

## 2022-11-05 DIAGNOSIS — F419 Anxiety disorder, unspecified: Secondary | ICD-10-CM | POA: Diagnosis not present

## 2022-11-16 NOTE — Progress Notes (Signed)
Remote pacemaker transmission.   

## 2022-11-24 DIAGNOSIS — Z6822 Body mass index (BMI) 22.0-22.9, adult: Secondary | ICD-10-CM | POA: Diagnosis not present

## 2022-11-24 DIAGNOSIS — L279 Dermatitis due to unspecified substance taken internally: Secondary | ICD-10-CM | POA: Diagnosis not present

## 2022-11-24 DIAGNOSIS — I10 Essential (primary) hypertension: Secondary | ICD-10-CM | POA: Diagnosis not present

## 2022-12-29 ENCOUNTER — Encounter: Payer: Self-pay | Admitting: Cardiology

## 2022-12-29 ENCOUNTER — Ambulatory Visit: Payer: Medicare Other | Attending: Cardiology | Admitting: Cardiology

## 2022-12-29 VITALS — BP 130/60 | HR 66 | Ht 62.0 in | Wt 125.4 lb

## 2022-12-29 DIAGNOSIS — R0989 Other specified symptoms and signs involving the circulatory and respiratory systems: Secondary | ICD-10-CM

## 2022-12-29 DIAGNOSIS — I495 Sick sinus syndrome: Secondary | ICD-10-CM | POA: Diagnosis not present

## 2022-12-29 DIAGNOSIS — I10 Essential (primary) hypertension: Secondary | ICD-10-CM | POA: Diagnosis not present

## 2022-12-29 NOTE — Patient Instructions (Signed)
Medication Instructions:  Your physician recommends that you continue on your current medications as directed. Please refer to the Current Medication list given to you today.  *If you need a refill on your cardiac medications before your next appointment, please call your pharmacy*   Lab Work: NONE   If you have labs (blood work) drawn today and your tests are completely normal, you will receive your results only by: MyChart Message (if you have MyChart) OR A paper copy in the mail If you have any lab test that is abnormal or we need to change your treatment, we will call you to review the results.   Testing/Procedures: Your physician has requested that you have a carotid duplex. This test is an ultrasound of the carotid arteries in your neck. It looks at blood flow through these arteries that supply the brain with blood. Allow one hour for this exam. There are no restrictions or special instructions.    Follow-Up: At [facility], you and your health needs are our priority.  As part of our continuing mission to provide you with exceptional heart care, we have created designated Provider Care Teams.  These Care Teams include your primary Cardiologist (physician) and Advanced Practice Providers (APPs -  Physician Assistants and Nurse Practitioners) who all work together to provide you with the care you need, when you need it.  We recommend signing up for the patient portal called "MyChart".  Sign up information is provided on this After Visit Summary.  MyChart is used to connect with patients for Virtual Visits (Telemedicine).  Patients are able to view lab/test results, encounter notes, upcoming appointments, etc.  Non-urgent messages can be sent to your provider as well.   To learn more about what you can do with MyChart, go to [email or web address].    Your next appointment:   1 year(s)  Provider:   Nona Dell, MD    Other Instructions Thank you for choosing [facility]!

## 2022-12-29 NOTE — Addendum Note (Signed)
Addended by: Eustace Moore on: 12/29/2022 12:31 PM   Modules accepted: Orders

## 2022-12-29 NOTE — Progress Notes (Signed)
    Cardiology Office Note  Date: 12/29/2022   ID: Missey, Mccright Jan 22, 1946, MRN [id number]  History of Present Illness: Gearldine Brummond is a 77 y.o. female last seen in June 2023.  She is here for a follow-up visit.  Reports no palpitations or chest discomfort with typical activities, NYHA class II dyspnea.  She is still following with Dr. Olena Leatherwood for primary care.  St. Jude pacemaker in place with follow-up by Dr. Nelly Laurence.  Device check in March indicated normal function.  ECG today shows normal sinus rhythm.  I went over her medications which are noted below.  She reports compliance with therapy.  Blood pressure control is reasonable today on Norvasc.  Physical Exam: VS:  BP 130/60   Pulse 66   Ht 5\' 2"  (1.575 m)   Wt 125 lb 6.4 oz (56.9 kg)   SpO2 94%   BMI 22.94 kg/m , BMI Body mass index is 22.94 kg/m.  Wt Readings from Last 3 Encounters:  12/29/22 125 lb 6.4 oz (56.9 kg)  09/10/22 130 lb 9.6 oz (59.2 kg)  12/25/21 122 lb (55.3 kg)    General: Patient appears comfortable at rest. HEENT: Conjunctiva and lids normal. Neck: Supple, no elevated JVP, soft left carotid bruit. Lungs: Clear to auscultation, nonlabored breathing at rest. Cardiac: Regular rate and rhythm, no S3, 1/6 systolic murmur. Extremities: No pitting edema.  ECG:  An ECG dated 07/04/2021 was personally reviewed today and demonstrated:  Atrial paced rhythm.  Labwork:  February 2024: BUN 11, creatinine 0.89, potassium 4.6, cholesterol 209, triglycerides 97, HDL 50, LDL 142  Other Studies Reviewed Today:  No interval cardiac testing for review today.  Assessment and Plan:  1.  Sinus node dysfunction status post St. Jude pacemaker.  Keep follow-up with Dr. Nelly Laurence.  ECG shows normal sinus rhythm today.  She is asymptomatic.  2.  Essential hypertension.  Systolic in the 130s today on Norvasc.  No changes were made.  3.  Soft left carotid bruit, obtain carotid Dopplers.  Disposition:  Follow up  1  year.  Signed, Jonelle Sidle, M.D., F.A.C.C. [facility]

## 2023-01-05 ENCOUNTER — Ambulatory Visit: Payer: Medicare Other

## 2023-01-05 DIAGNOSIS — I495 Sick sinus syndrome: Secondary | ICD-10-CM | POA: Diagnosis not present

## 2023-01-06 LAB — CUP PACEART REMOTE DEVICE CHECK
Battery Remaining Longevity: 98 mo
Battery Remaining Percentage: 78 %
Battery Voltage: 3.01 V
Brady Statistic AP VP Percent: 1 %
Brady Statistic AP VS Percent: 59 %
Brady Statistic AS VP Percent: 1 %
Brady Statistic AS VS Percent: 40 %
Brady Statistic RA Percent Paced: 60 %
Brady Statistic RV Percent Paced: 1 %
Date Time Interrogation Session: 20240611020013
Implantable Lead Connection Status: 753985
Implantable Lead Connection Status: 753985
Implantable Lead Implant Date: 20100908
Implantable Lead Implant Date: 20100908
Implantable Lead Location: 753859
Implantable Lead Location: 753860
Implantable Lead Model: 1944
Implantable Lead Model: 1948
Implantable Pulse Generator Implant Date: 20210914
Lead Channel Impedance Value: 450 Ohm
Lead Channel Impedance Value: 480 Ohm
Lead Channel Pacing Threshold Amplitude: 0.5 V
Lead Channel Pacing Threshold Amplitude: 0.5 V
Lead Channel Pacing Threshold Pulse Width: 0.4 ms
Lead Channel Pacing Threshold Pulse Width: 0.5 ms
Lead Channel Sensing Intrinsic Amplitude: 3 mV
Lead Channel Sensing Intrinsic Amplitude: 8.7 mV
Lead Channel Setting Pacing Amplitude: 0.75 V
Lead Channel Setting Pacing Amplitude: 1.5 V
Lead Channel Setting Pacing Pulse Width: 0.4 ms
Lead Channel Setting Sensing Sensitivity: 4 mV
Pulse Gen Model: 2272
Pulse Gen Serial Number: 3863879

## 2023-01-07 ENCOUNTER — Ambulatory Visit (HOSPITAL_COMMUNITY)
Admission: RE | Admit: 2023-01-07 | Discharge: 2023-01-07 | Disposition: A | Discharge status: Home / Self Care | Payer: Medicare Other | Source: Ambulatory Visit | Attending: Cardiology | Admitting: Cardiology

## 2023-01-07 DIAGNOSIS — I6523 Occlusion and stenosis of bilateral carotid arteries: Secondary | ICD-10-CM | POA: Diagnosis not present

## 2023-01-07 DIAGNOSIS — R0989 Other specified symptoms and signs involving the circulatory and respiratory systems: Secondary | ICD-10-CM | POA: Diagnosis not present

## 2023-01-12 DIAGNOSIS — I10 Essential (primary) hypertension: Secondary | ICD-10-CM | POA: Diagnosis not present

## 2023-01-12 DIAGNOSIS — K219 Gastro-esophageal reflux disease without esophagitis: Secondary | ICD-10-CM | POA: Diagnosis not present

## 2023-01-12 DIAGNOSIS — F411 Generalized anxiety disorder: Secondary | ICD-10-CM | POA: Diagnosis not present

## 2023-01-12 DIAGNOSIS — Z6822 Body mass index (BMI) 22.0-22.9, adult: Secondary | ICD-10-CM | POA: Diagnosis not present

## 2023-01-14 ENCOUNTER — Encounter: Payer: Self-pay | Admitting: Cardiology

## 2023-01-14 NOTE — Progress Notes (Signed)
LMIB vdg

## 2023-01-14 NOTE — Telephone Encounter (Signed)
Error

## 2023-02-03 NOTE — Progress Notes (Signed)
Remote pacemaker transmission.   

## 2023-02-23 DIAGNOSIS — I10 Essential (primary) hypertension: Secondary | ICD-10-CM | POA: Diagnosis not present

## 2023-02-23 DIAGNOSIS — M818 Other osteoporosis without current pathological fracture: Secondary | ICD-10-CM | POA: Diagnosis not present

## 2023-02-23 DIAGNOSIS — K219 Gastro-esophageal reflux disease without esophagitis: Secondary | ICD-10-CM | POA: Diagnosis not present

## 2023-02-23 DIAGNOSIS — F411 Generalized anxiety disorder: Secondary | ICD-10-CM | POA: Diagnosis not present

## 2023-02-24 DIAGNOSIS — F3342 Major depressive disorder, recurrent, in full remission: Secondary | ICD-10-CM | POA: Diagnosis not present

## 2023-02-24 DIAGNOSIS — M62838 Other muscle spasm: Secondary | ICD-10-CM | POA: Diagnosis not present

## 2023-04-06 ENCOUNTER — Ambulatory Visit (INDEPENDENT_AMBULATORY_CARE_PROVIDER_SITE_OTHER): Payer: Medicare Other

## 2023-04-06 DIAGNOSIS — I495 Sick sinus syndrome: Secondary | ICD-10-CM | POA: Diagnosis not present

## 2023-04-07 LAB — CUP PACEART REMOTE DEVICE CHECK
Battery Remaining Longevity: 95 mo
Battery Remaining Percentage: 76 %
Battery Voltage: 3.01 V
Brady Statistic AP VP Percent: 1 %
Brady Statistic AP VS Percent: 61 %
Brady Statistic AS VP Percent: 1 %
Brady Statistic AS VS Percent: 38 %
Brady Statistic RA Percent Paced: 61 %
Brady Statistic RV Percent Paced: 1 %
Date Time Interrogation Session: 20240910020013
Implantable Lead Connection Status: 753985
Implantable Lead Connection Status: 753985
Implantable Lead Implant Date: 20100908
Implantable Lead Implant Date: 20100908
Implantable Lead Location: 753859
Implantable Lead Location: 753860
Implantable Lead Model: 1944
Implantable Lead Model: 1948
Implantable Pulse Generator Implant Date: 20210914
Lead Channel Impedance Value: 440 Ohm
Lead Channel Impedance Value: 460 Ohm
Lead Channel Pacing Threshold Amplitude: 0.5 V
Lead Channel Pacing Threshold Amplitude: 0.5 V
Lead Channel Pacing Threshold Pulse Width: 0.4 ms
Lead Channel Pacing Threshold Pulse Width: 0.5 ms
Lead Channel Sensing Intrinsic Amplitude: 3 mV
Lead Channel Sensing Intrinsic Amplitude: 8.2 mV
Lead Channel Setting Pacing Amplitude: 0.75 V
Lead Channel Setting Pacing Amplitude: 1.5 V
Lead Channel Setting Pacing Pulse Width: 0.4 ms
Lead Channel Setting Sensing Sensitivity: 4 mV
Pulse Gen Model: 2272
Pulse Gen Serial Number: 3863879

## 2023-04-23 NOTE — Progress Notes (Signed)
Remote pacemaker transmission.   

## 2023-05-26 DIAGNOSIS — F411 Generalized anxiety disorder: Secondary | ICD-10-CM | POA: Diagnosis not present

## 2023-05-26 DIAGNOSIS — Z0000 Encounter for general adult medical examination without abnormal findings: Secondary | ICD-10-CM | POA: Diagnosis not present

## 2023-05-26 DIAGNOSIS — K219 Gastro-esophageal reflux disease without esophagitis: Secondary | ICD-10-CM | POA: Diagnosis not present

## 2023-05-26 DIAGNOSIS — I10 Essential (primary) hypertension: Secondary | ICD-10-CM | POA: Diagnosis not present

## 2023-05-26 DIAGNOSIS — N182 Chronic kidney disease, stage 2 (mild): Secondary | ICD-10-CM | POA: Diagnosis not present

## 2023-06-04 DIAGNOSIS — H43813 Vitreous degeneration, bilateral: Secondary | ICD-10-CM | POA: Diagnosis not present

## 2023-06-17 NOTE — Progress Notes (Signed)
[facility] - Clinic Note  06/18/2023   CHIEF COMPLAINT Patient presents for Retina Evaluation  HISTORY OF PRESENT ILLNESS: [name] is a 77 y.o. female who presents to the clinic today for:  HPI     Retina Evaluation   In left eye.  This started 1 month ago.  Duration of 1 month.  I, the attending physician,  performed the HPI with the patient and updated documentation appropriately.        Comments   Retina eval per Dr Earlene Plater for nevus OS pt is reporting no vision changes noticed she denies any flashes or floaters pt has been having headaches on the left side for a while       Last edited by Rennis Chris, MD on 06/18/2023  5:21 PM.     Referring physician: Desiree Lucy, OD [address] [place],  [place] [address]  HISTORICAL INFORMATION:  Selected notes from the MEDICAL RECORD NUMBER Referred by Dr. Earlene Plater for eval of choroidal nevus OS LEE:  Ocular Hx- PMH-   CURRENT MEDICATIONS: No current outpatient medications on file. (Ophthalmic Drugs)   No current facility-administered medications for this visit. (Ophthalmic Drugs)   Current Outpatient Medications (Other)  Medication Sig   albuterol (VENTOLIN HFA) 108 (90 Base) MCG/ACT inhaler Inhale 2 puffs into the lungs every 6 (six) hours as needed for wheezing or shortness of breath.   ALPRAZolam (XANAX) 1 MG tablet Take 1 mg by mouth 2 (two) times daily.   amLODipine (NORVASC) 2.5 MG tablet Take 2.5 mg by mouth daily.   Ascorbic Acid (VITAMIN C) 1000 MG tablet Take 1,000 mg by mouth daily.   b complex vitamins tablet Take 1 tablet by mouth daily.   Cholecalciferol (DIALYVITE VITAMIN D 5000) 125 MCG (5000 UT) capsule Take 5,000 Units by mouth daily.   famotidine (PEPCID) 40 MG tablet Take 40 mg by mouth 2 (two) times daily.   folic acid (FOLVITE) 1 MG tablet Take 1 mg by mouth daily.   gabapentin (NEURONTIN) 400 MG capsule Take 400-800 mg by mouth See admin instructions. Take 1 capsule  (400 mg) by mouth in the morning & take 2 capsules (800 mg) by mouth at night.   ipratropium-albuterol (DUONEB) 0.5-2.5 (3) MG/3ML SOLN Take 3 mLs by nebulization every 4 (four) hours as needed (shortness of breath).   levothyroxine (SYNTHROID) 25 MCG tablet Take 25 mcg by mouth every morning.   Omega-3 Fatty Acids (FISH OIL) 1000 MG CAPS Take 1,000 mg by mouth 3 (three) times daily.    sertraline (ZOLOFT) 50 MG tablet Take 50 mg by mouth daily.   Vitamin A 2400 MCG (8000 UT) CAPS Take 2,400 mcg by mouth daily.   vitamin B-12 (CYANOCOBALAMIN) 50 MCG tablet Take 50 mcg by mouth daily.   VITAMIN E PO Take 1 capsule by mouth daily.   No current facility-administered medications for this visit. (Other)   REVIEW OF SYSTEMS: ROS   Positive for: Endocrine, Eyes, Psychiatric, Allergic/Imm Last edited by Etheleen Mayhew, COT on 06/18/2023  8:48 AM.     ALLERGIES No Known Allergies PAST MEDICAL HISTORY Past Medical History:  Diagnosis Date   Chronic pain    COPD with emphysema (HCC)    Depression    GERD (gastroesophageal reflux disease)    Hypertension    Mitral and aortic regurgitation    Mild   Neurodermatitis    Peptic ulcer    Sick sinus syndrome (HCC)  s/p PPM (SJM)   Syncope    Past Surgical History:  Procedure Laterality Date   APPENDECTOMY     BIOPSY  05/12/2019   Procedure: BIOPSY;  Surgeon: Malissa Hippo, MD;  Location: [facility];  Service: Endoscopy;;  antral, pre-pyloric mucosa   CARPAL TUNNEL RELEASE     CHOLECYSTECTOMY     COLONOSCOPY WITH PROPOFOL N/A 03/03/2019   Procedure: COLONOSCOPY WITH PROPOFOL;  Surgeon: Malissa Hippo, MD;  Location: [facility];  Service: Endoscopy;  Laterality: N/A;  9:50   ESOPHAGEAL DILATION N/A 01/15/2017   Procedure: ESOPHAGEAL DILATION;  Surgeon: Malissa Hippo, MD;  Location: [facility];  Service: Endoscopy;  Laterality: N/A;   ESOPHAGOGASTRODUODENOSCOPY (EGD) WITH PROPOFOL N/A 01/15/2017   Procedure:  ESOPHAGOGASTRODUODENOSCOPY (EGD) WITH PROPOFOL;  Surgeon: Malissa Hippo, MD;  Location: [facility];  Service: Endoscopy;  Laterality: N/A;  9:15   ESOPHAGOGASTRODUODENOSCOPY (EGD) WITH PROPOFOL N/A 05/12/2019   Procedure: ESOPHAGOGASTRODUODENOSCOPY (EGD) WITH PROPOFOL;  Surgeon: Malissa Hippo, MD;  Location: [facility];  Service: Endoscopy;  Laterality: N/A;  855am   NISSEN FUNDOPLICATION     PACEMAKER INSERTION     St Jude   PPM GENERATOR CHANGEOUT N/A 04/09/2020   Procedure: PPM GENERATOR CHANGEOUT;  Surgeon: Hillis Range, MD;  Location: [facility];  Service: Cardiovascular;  Laterality: N/A;   SHOULDER SURGERY     SPLENECTOMY     FAMILY HISTORY History reviewed. No pertinent family history. SOCIAL HISTORY Social History   Tobacco Use   Smoking status: Former    Current packs/day: 0.00    Average packs/day: 2.0 packs/day for 48.8 years (97.5 ttl pk-yrs)    Types: Cigarettes    Start date: 11/13/1963    Quit date: 08/18/2012    Years since quitting: 10.8   Smokeless tobacco: Never   Tobacco comments:    I am pleased that she recently quit.  Vaping Use   Vaping status: Never Used  Substance Use Topics   Alcohol use: No    Alcohol/week: 0.0 standard drinks of alcohol    Comment: Former heavy drinker   Drug use: No    Comment: Former benodiazepine dependence       OPHTHALMIC EXAM:  Base Eye Exam     Visual Acuity (Snellen - Linear)       Right Left   Dist cc 20/30 20/40 -2   Dist ph cc NI NI    Correction: Glasses         Tonometry (Tonopen, 9:08 AM)       Right Left   Pressure 13 10         Pupils       Pupils Dark Light Shape React APD   Right PERRL 4 3 Round Brisk None   Left PERRL 4 3 Round Brisk None         Visual Fields       Left Right    Full Full         Extraocular Movement       Right Left    Full, Ortho Full, Ortho         Neuro/Psych     Oriented x3: Yes   Mood/Affect: Normal         Dilation      Both eyes: 2.5% Phenylephrine @ 9:08 AM           Slit Lamp and Fundus Exam     External Exam  Right Left   External Normal Normal         Slit Lamp Exam       Right Left   Lids/Lashes Dermatochalasis - upper lid, Meibomian gland dysfunction Dermatochalasis - upper lid, Meibomian gland dysfunction   Conjunctiva/Sclera White and quiet White and quiet   Cornea Arcus, Well healed cataract wound Arcus, Well healed cataract wound   Anterior Chamber Deep and clear Deep and clear   Iris Round and dilated Round and dilated   Lens PC IOL in good postion with open PC PC IOL in good postion with open PC   Anterior Vitreous Vitreous syneresis Vitreous syneresis, Posterior vitreous detachment         Fundus Exam       Right Left   Disc trace Pallor, Sharp rim Mild Pallor, Sharp rim   C/D Ratio 0.2 0.3   Macula Flat, Blunted foveal reflex, RPE mottling, No heme or edema Flat, Blunted foveal reflex, RPE mottling, irregular pigmented lesion along IT arcades approx. 3DD in size, flat, no drusen, orange pigment or SRF   Vessels Vascular attenuation, Tortuous, AV crossing changes Vascular attenuation, Tortuous, AV crossing changes   Periphery Attached, No heme, mild peripheral drusen, mild Reticular degeneration Attached, No heme, mild Reticular degeneration, mild peripheral drusen           Refraction     Wearing Rx       Sphere Cylinder Axis Add   Right -1.75 +1.50 013 +2.50   Left -1.00 +1.50 159 +2.50         Manifest Refraction       Sphere Cylinder Axis Dist VA Add   Right -2.25 +1.00 020 20/30 +2.50   Left -1.00 +1.00 165 20/40 +2.50           IMAGING AND PROCEDURES  Imaging and Procedures for 06/18/2023  OCT, Retina - OU - Both Eyes       Right Eye Quality was good. Central Foveal Thickness: 292. Progression has no prior data. Findings include normal foveal contour, no IRF, no SRF.   Left Eye Quality was good. Central Foveal Thickness: 292.  Progression has no prior data. Findings include normal foveal contour, no IRF, no SRF (Focal hyper-reflective choroidal lesion inferior macula along IT arcades -- no elevation or fluid).   Notes *Images captured and stored on drive  Diagnosis / Impression:  OD: NFP; no IRF/SRF  OS: NFP; no IRF/SRF; Focal hyper-reflective choroidal lesion inferior macula along IT arcades -- no elevation or fluid  Clinical management:  See below  Abbreviations: NFP - Normal foveal profile. CME - cystoid macular edema. PED - pigment epithelial detachment. IRF - intraretinal fluid. SRF - subretinal fluid. EZ - ellipsoid zone. ERM - epiretinal membrane. ORA - outer retinal atrophy. ORT - outer retinal tubulation. SRHM - subretinal hyper-reflective material. IRHM - intraretinal hyper-reflective material      Color Fundus Photography Optos - OU - Both Eyes       Right Eye Progression has no prior data. Disc findings include normal observations. Macula : normal observations. Vessels : tortuous vessels. Periphery : RPE abnormality (Scattered peripheral drusen ).   Left Eye Progression has no prior data. Disc findings include normal observations. Macula : retinal pigment epithelium abnormalities (Irregular pigmented choroidal lesion along IT arcades approx. 3DD, no drusen, orange pigment or SRF). Vessels : tortuous vessels. Periphery : RPE abnormality.   Notes *Images captured and stored on drive  Diagnosis / Impression:  OD: Scattered peripheral  drusen  OS: Irregular pigmented choroidal lesion along IT arcades approx. 3DD - no drusen, orange pigment or SRF  Clinical management:  See below  Abbreviations: NFP - Normal foveal profile. CME - cystoid macular edema. PED - pigment epithelial detachment. IRF - intraretinal fluid. SRF - subretinal fluid. EZ - ellipsoid zone. ERM - epiretinal membrane. ORA - outer retinal atrophy. ORT - outer retinal tubulation. SRHM - subretinal hyper-reflective material. IRHM -  intraretinal hyper-reflective material           ASSESSMENT/PLAN:   ICD-10-CM   1. 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 Essential hypertension  I10     3. Hypertensive retinopathy of both eyes  H35.033 OCT, Retina - OU - Both Eyes    4. Pseudophakia of both eyes  Z96.1      Choroidal Nevus, OS - flat, irregular, pigmented lesion along IT arcades, ~3DD - baseline [place] and optos taken today 11.22.24 - OCT shows OD: WNL, OS: Focal hyper reflection choroidal lesion inferior macula along IT arcades-- no elevation  - no visual symptoms, no drusen, SRF or orange pigment  - thickness < 2mm -- essentially flat  - discussed findings, prognosis  - recommend monitoring  - f/u 4-6 month, DFE, OCT, optos color photos   2,3. Hypertensive retinopathy OU - discussed importance of tight BP control - monitor   4.Pseudophakia OU  - s/p CE/IOL OU in [place], [place]  - IOL in good position, doing well  - monitor   Ophthalmic Meds Ordered this visit:  No orders of the defined types were placed in this encounter.    Return in about 6 months (around 12/16/2023) for f/u Nevus OS, DFE, OCT, Optos color photos.  There are no Patient Instructions on file for this visit.  Explained the diagnoses, plan, and follow up with the patient and they expressed understanding.  Patient expressed understanding of the importance of proper follow up care.   This document serves as a record of services personally performed by Karie Chimera, MD, PhD. It was created on their behalf by Charlette Caffey, COT an ophthalmic technician. The creation of this record is the provider's dictation and/or activities during the visit.    Electronically signed by:  Charlette Caffey, COT  06/18/23 5:23 PM  Karie Chimera, M.D., Ph.D. Diseases & Surgery of the Retina and Vitreous [facility] 06/18/2023  I have reviewed the above documentation for  accuracy and completeness, and I agree with the above. Karie Chimera, M.D., Ph.D. 06/18/23 5:25 PM  Abbreviations: M myopia (nearsighted); A astigmatism; H hyperopia (farsighted); P presbyopia; Mrx spectacle prescription;  CTL contact lenses; OD right eye; OS left eye; OU both eyes  XT exotropia; ET esotropia; PEK punctate epithelial keratitis; PEE punctate epithelial erosions; DES dry eye syndrome; MGD meibomian gland dysfunction; ATs artificial tears; PFAT's preservative free artificial tears; NSC nuclear sclerotic cataract; PSC posterior subcapsular cataract; ERM epi-retinal membrane; PVD posterior vitreous detachment; RD retinal detachment; DM diabetes mellitus; DR diabetic retinopathy; NPDR non-proliferative diabetic retinopathy; PDR proliferative diabetic retinopathy; CSME clinically significant macular edema; DME diabetic macular edema; dbh dot blot hemorrhages; CWS cotton wool spot; POAG primary open angle glaucoma; C/D cup-to-disc ratio; HVF humphrey visual field; GVF goldmann visual field; OCT optical coherence tomography; IOP intraocular pressure; BRVO Branch retinal vein occlusion; CRVO central retinal vein occlusion; CRAO central retinal artery occlusion; BRAO branch retinal artery occlusion;  RT retinal tear; SB scleral buckle; PPV pars plana vitrectomy; VH Vitreous hemorrhage; PRP panretinal laser photocoagulation; IVK intravitreal kenalog; VMT vitreomacular traction; MH Macular hole;  NVD neovascularization of the disc; NVE neovascularization elsewhere; AREDS age related eye disease study; ARMD age related macular degeneration; POAG primary open angle glaucoma; EBMD epithelial/anterior basement membrane dystrophy; ACIOL anterior chamber intraocular lens; IOL intraocular lens; PCIOL posterior chamber intraocular lens; Phaco/IOL phacoemulsification with intraocular lens placement; PRK photorefractive keratectomy; LASIK laser assisted in situ keratomileusis; HTN hypertension; DM diabetes mellitus;  COPD chronic obstructive pulmonary disease

## 2023-06-18 ENCOUNTER — Encounter (INDEPENDENT_AMBULATORY_CARE_PROVIDER_SITE_OTHER): Payer: Self-pay | Admitting: Ophthalmology

## 2023-06-18 ENCOUNTER — Ambulatory Visit (INDEPENDENT_AMBULATORY_CARE_PROVIDER_SITE_OTHER): Payer: Medicare Other | Admitting: Ophthalmology

## 2023-06-18 DIAGNOSIS — D3132 Benign neoplasm of left choroid: Secondary | ICD-10-CM | POA: Diagnosis not present

## 2023-06-18 DIAGNOSIS — I10 Essential (primary) hypertension: Secondary | ICD-10-CM | POA: Diagnosis not present

## 2023-06-18 DIAGNOSIS — Z961 Presence of intraocular lens: Secondary | ICD-10-CM | POA: Diagnosis not present

## 2023-06-18 DIAGNOSIS — H3581 Retinal edema: Secondary | ICD-10-CM

## 2023-06-18 DIAGNOSIS — H35033 Hypertensive retinopathy, bilateral: Secondary | ICD-10-CM | POA: Diagnosis not present

## 2023-07-06 ENCOUNTER — Ambulatory Visit (INDEPENDENT_AMBULATORY_CARE_PROVIDER_SITE_OTHER): Payer: Medicare Other

## 2023-07-06 DIAGNOSIS — I495 Sick sinus syndrome: Secondary | ICD-10-CM

## 2023-07-08 LAB — CUP PACEART REMOTE DEVICE CHECK
Battery Remaining Longevity: 92 mo
Battery Remaining Percentage: 74 %
Battery Voltage: 3.01 V
Brady Statistic AP VP Percent: 1 %
Brady Statistic AP VS Percent: 61 %
Brady Statistic AS VP Percent: 1 %
Brady Statistic AS VS Percent: 38 %
Brady Statistic RA Percent Paced: 61 %
Brady Statistic RV Percent Paced: 1 %
Date Time Interrogation Session: 20241210020014
Implantable Lead Connection Status: 753985
Implantable Lead Connection Status: 753985
Implantable Lead Implant Date: 20100908
Implantable Lead Implant Date: 20100908
Implantable Lead Location: 753859
Implantable Lead Location: 753860
Implantable Lead Model: 1944
Implantable Lead Model: 1948
Implantable Pulse Generator Implant Date: 20210914
Lead Channel Impedance Value: 450 Ohm
Lead Channel Impedance Value: 450 Ohm
Lead Channel Pacing Threshold Amplitude: 0.5 V
Lead Channel Pacing Threshold Amplitude: 0.625 V
Lead Channel Pacing Threshold Pulse Width: 0.4 ms
Lead Channel Pacing Threshold Pulse Width: 0.5 ms
Lead Channel Sensing Intrinsic Amplitude: 2.6 mV
Lead Channel Sensing Intrinsic Amplitude: 8.5 mV
Lead Channel Setting Pacing Amplitude: 0.875
Lead Channel Setting Pacing Amplitude: 1.5 V
Lead Channel Setting Pacing Pulse Width: 0.4 ms
Lead Channel Setting Sensing Sensitivity: 4 mV
Pulse Gen Model: 2272
Pulse Gen Serial Number: 3863879

## 2023-08-16 NOTE — Progress Notes (Signed)
Remote pacemaker transmission.   

## 2023-08-16 NOTE — Addendum Note (Signed)
Addended by: Elease Etienne A on: 08/16/2023 08:50 AM   Modules accepted: Orders

## 2023-08-25 DIAGNOSIS — K219 Gastro-esophageal reflux disease without esophagitis: Secondary | ICD-10-CM | POA: Diagnosis not present

## 2023-08-25 DIAGNOSIS — F411 Generalized anxiety disorder: Secondary | ICD-10-CM | POA: Diagnosis not present

## 2023-08-25 DIAGNOSIS — N182 Chronic kidney disease, stage 2 (mild): Secondary | ICD-10-CM | POA: Diagnosis not present

## 2023-08-25 DIAGNOSIS — I10 Essential (primary) hypertension: Secondary | ICD-10-CM | POA: Diagnosis not present

## 2023-08-25 DIAGNOSIS — M818 Other osteoporosis without current pathological fracture: Secondary | ICD-10-CM | POA: Diagnosis not present

## 2023-08-25 DIAGNOSIS — Z0000 Encounter for general adult medical examination without abnormal findings: Secondary | ICD-10-CM | POA: Diagnosis not present

## 2023-09-03 ENCOUNTER — Encounter: Payer: Medicare Other | Admitting: Cardiovascular Disease

## 2023-09-10 ENCOUNTER — Encounter: Payer: Self-pay | Admitting: Cardiovascular Disease

## 2023-09-10 ENCOUNTER — Ambulatory Visit: Payer: Medicare Other | Attending: Cardiovascular Disease | Admitting: Cardiovascular Disease

## 2023-09-10 VITALS — BP 128/78 | HR 82 | Ht 62.0 in | Wt 125.6 lb

## 2023-09-10 DIAGNOSIS — I495 Sick sinus syndrome: Secondary | ICD-10-CM

## 2023-09-10 LAB — CUP PACEART INCLINIC DEVICE CHECK
Battery Remaining Longevity: 94 mo
Battery Voltage: 3.01 V
Brady Statistic RA Percent Paced: 62 %
Brady Statistic RV Percent Paced: 0.48 %
Date Time Interrogation Session: 20250214111849
Implantable Lead Connection Status: 753985
Implantable Lead Connection Status: 753985
Implantable Lead Implant Date: 20100908
Implantable Lead Implant Date: 20100908
Implantable Lead Location: 753859
Implantable Lead Location: 753860
Implantable Lead Model: 1944
Implantable Lead Model: 1948
Implantable Pulse Generator Implant Date: 20210914
Lead Channel Impedance Value: 537.5 Ohm
Lead Channel Impedance Value: 537.5 Ohm
Lead Channel Pacing Threshold Amplitude: 0.5 V
Lead Channel Pacing Threshold Amplitude: 0.5 V
Lead Channel Pacing Threshold Pulse Width: 0.4 ms
Lead Channel Pacing Threshold Pulse Width: 0.5 ms
Lead Channel Sensing Intrinsic Amplitude: 10.6 mV
Lead Channel Sensing Intrinsic Amplitude: 3.1 mV
Lead Channel Setting Pacing Amplitude: 0.75 V
Lead Channel Setting Pacing Amplitude: 1.5 V
Lead Channel Setting Pacing Pulse Width: 0.4 ms
Lead Channel Setting Sensing Sensitivity: 2 mV
Pulse Gen Model: 2272
Pulse Gen Serial Number: 3863879

## 2023-09-10 NOTE — Patient Instructions (Signed)

## 2023-09-10 NOTE — Progress Notes (Signed)
    PCP: Toma Deiters, MD   Primary EP:  Dr Nelly Laurence  [name] is a 78 y.o. female who presents today for routine electrophysiology followup.  Since last being seen in our clinic, the patient reports doing very well.     Today, she denies symptoms of palpitations, exertional chest pain, shortness of breath,  lower extremity edema, dizziness, presyncope, or syncope.  The patient is otherwise without complaint today.      Physical Exam: Vitals:   09/10/23 1057  BP: 128/78  Pulse: 82  SpO2: 94%  Weight: 125 lb 9.6 oz (57 kg)  Height: 5\' 2"  (1.575 m)   Gen: Appears comfortable, well-nourished CV: RRR, no dependent edema  Pulm: breathing easily The device site is normal -- no tenderness, edema, drainage, redness, threatened erosion.   Pacemaker interrogation- reviewed in detail today,  See PACEART report   Assessment and Plan:  1. Symptomatic sinus bradycardia  Normal pacemaker function See Pace Art report No changes today she is not device dependant today  2. Tobacco Quit since 2014  Return in a year  Maurice Small, MD 09/10/2023 11:36 AM

## 2023-09-22 DIAGNOSIS — J40 Bronchitis, not specified as acute or chronic: Secondary | ICD-10-CM | POA: Diagnosis not present

## 2023-09-22 DIAGNOSIS — I10 Essential (primary) hypertension: Secondary | ICD-10-CM | POA: Diagnosis not present

## 2023-09-22 DIAGNOSIS — H6123 Impacted cerumen, bilateral: Secondary | ICD-10-CM | POA: Diagnosis not present

## 2023-09-22 DIAGNOSIS — Z6823 Body mass index (BMI) 23.0-23.9, adult: Secondary | ICD-10-CM | POA: Diagnosis not present

## 2023-10-05 ENCOUNTER — Ambulatory Visit (INDEPENDENT_AMBULATORY_CARE_PROVIDER_SITE_OTHER): Payer: Medicare Other

## 2023-10-05 DIAGNOSIS — I495 Sick sinus syndrome: Secondary | ICD-10-CM | POA: Diagnosis not present

## 2023-10-06 LAB — CUP PACEART REMOTE DEVICE CHECK
Battery Remaining Longevity: 89 mo
Battery Remaining Percentage: 71 %
Battery Voltage: 3.01 V
Brady Statistic AP VP Percent: 1 %
Brady Statistic AP VS Percent: 61 %
Brady Statistic AS VP Percent: 1 %
Brady Statistic AS VS Percent: 38 %
Brady Statistic RA Percent Paced: 62 %
Brady Statistic RV Percent Paced: 1 %
Date Time Interrogation Session: 20250311020015
Implantable Lead Connection Status: 753985
Implantable Lead Connection Status: 753985
Implantable Lead Implant Date: 20100908
Implantable Lead Implant Date: 20100908
Implantable Lead Location: 753859
Implantable Lead Location: 753860
Implantable Lead Model: 1944
Implantable Lead Model: 1948
Implantable Pulse Generator Implant Date: 20210914
Lead Channel Impedance Value: 450 Ohm
Lead Channel Impedance Value: 460 Ohm
Lead Channel Pacing Threshold Amplitude: 0.5 V
Lead Channel Pacing Threshold Amplitude: 0.625 V
Lead Channel Pacing Threshold Pulse Width: 0.4 ms
Lead Channel Pacing Threshold Pulse Width: 0.5 ms
Lead Channel Sensing Intrinsic Amplitude: 4.1 mV
Lead Channel Sensing Intrinsic Amplitude: 9.2 mV
Lead Channel Setting Pacing Amplitude: 0.875
Lead Channel Setting Pacing Amplitude: 1.5 V
Lead Channel Setting Pacing Pulse Width: 0.4 ms
Lead Channel Setting Sensing Sensitivity: 2 mV
Pulse Gen Model: 2272
Pulse Gen Serial Number: 3863879

## 2023-10-13 ENCOUNTER — Encounter: Payer: Self-pay | Admitting: Cardiovascular Disease

## 2023-11-23 NOTE — Progress Notes (Signed)
 Remote pacemaker transmission.

## 2023-11-23 NOTE — Addendum Note (Signed)
 Addended by: Lott Rouleau A on: 11/23/2023 11:04 AM   Modules accepted: Orders

## 2023-11-24 DIAGNOSIS — J440 Chronic obstructive pulmonary disease with acute lower respiratory infection: Secondary | ICD-10-CM | POA: Diagnosis not present

## 2023-11-24 DIAGNOSIS — F411 Generalized anxiety disorder: Secondary | ICD-10-CM | POA: Diagnosis not present

## 2023-11-24 DIAGNOSIS — Z6821 Body mass index (BMI) 21.0-21.9, adult: Secondary | ICD-10-CM | POA: Diagnosis not present

## 2023-11-24 DIAGNOSIS — I10 Essential (primary) hypertension: Secondary | ICD-10-CM | POA: Diagnosis not present

## 2023-12-14 DIAGNOSIS — M810 Age-related osteoporosis without current pathological fracture: Secondary | ICD-10-CM | POA: Diagnosis not present

## 2023-12-14 DIAGNOSIS — Z780 Asymptomatic menopausal state: Secondary | ICD-10-CM | POA: Diagnosis not present

## 2023-12-15 NOTE — Progress Notes (Shared)
 [facility] - Clinic Note  12/17/2023   CHIEF COMPLAINT Patient presents for No chief complaint on file.  HISTORY OF PRESENT ILLNESS: [name] is a 78 y.o. female who presents to the clinic today for:    Referring physician: Patria Bookbinder, OD [address] [place] [place],  [place] [address]  HISTORICAL INFORMATION:  Selected notes from the MEDICAL RECORD NUMBER Referred by Dr. Nolon Baxter for eval of choroidal nevus OS LEE:  Ocular Hx- PMH-   CURRENT MEDICATIONS: No current outpatient medications on file. (Ophthalmic Drugs)   No current facility-administered medications for this visit. (Ophthalmic Drugs)   Current Outpatient Medications (Other)  Medication Sig   albuterol  (VENTOLIN  HFA) 108 (90 Base) MCG/ACT inhaler Inhale 2 puffs into the lungs every 6 (six) hours as needed for wheezing or shortness of breath.   ALPRAZolam (XANAX) 1 MG tablet Take 1 mg by mouth 2 (two) times daily.   amLODipine (NORVASC) 2.5 MG tablet Take 2.5 mg by mouth daily.   Ascorbic Acid (VITAMIN C) 1000 MG tablet Take 1,000 mg by mouth daily.   b complex vitamins tablet Take 1 tablet by mouth daily.   Cholecalciferol (DIALYVITE VITAMIN D 5000) 125 MCG (5000 UT) capsule Take 5,000 Units by mouth daily.   famotidine (PEPCID) 40 MG tablet Take 40 mg by mouth 2 (two) times daily.   folic acid (FOLVITE) 1 MG tablet Take 1 mg by mouth daily.   gabapentin (NEURONTIN) 400 MG capsule Take 400-800 mg by mouth See admin instructions. Take 1 capsule (400 mg) by mouth in the morning & take 2 capsules (800 mg) by mouth at night.   ipratropium-albuterol  (DUONEB) 0.5-2.5 (3) MG/3ML SOLN Take 3 mLs by nebulization every 4 (four) hours as needed (shortness of breath).   levothyroxine (SYNTHROID) 25 MCG tablet Take 25 mcg by mouth every morning.   Omega-3 Fatty Acids (FISH OIL) 1000 MG CAPS Take 1,000 mg by mouth 3 (three) times daily.    sertraline (ZOLOFT) 50 MG tablet Take 50 mg by mouth daily.    Vitamin A 2400 MCG (8000 UT) CAPS Take 2,400 mcg by mouth daily.   vitamin B-12 (CYANOCOBALAMIN) 50 MCG tablet Take 50 mcg by mouth daily.   VITAMIN E PO Take 1 capsule by mouth daily.   No current facility-administered medications for this visit. (Other)   REVIEW OF SYSTEMS:   ALLERGIES No Known Allergies PAST MEDICAL HISTORY Past Medical History:  Diagnosis Date   Chronic pain    COPD with emphysema (HCC)    Depression    GERD (gastroesophageal reflux disease)    Hypertension    Mitral and aortic regurgitation    Mild   Neurodermatitis    Peptic ulcer    Sick sinus syndrome (HCC)    s/p PPM (SJM)   Syncope    Past Surgical History:  Procedure Laterality Date   APPENDECTOMY     BIOPSY  05/12/2019   Procedure: BIOPSY;  Surgeon: Ruby Corporal, MD;  [facility];  Service: Endoscopy;;  antral, pre-pyloric mucosa   CARPAL TUNNEL RELEASE     CHOLECYSTECTOMY     COLONOSCOPY WITH PROPOFOL  N/A 03/03/2019   Procedure: COLONOSCOPY WITH PROPOFOL ;  Surgeon: Ruby Corporal, MD;  [facility];  Service: Endoscopy;  Laterality: N/A;  9:50   ESOPHAGEAL DILATION N/A 01/15/2017   Procedure: ESOPHAGEAL DILATION;  Surgeon: Ruby Corporal, MD;  [facility];  Service: Endoscopy;  Laterality: N/A;  ESOPHAGOGASTRODUODENOSCOPY (EGD) WITH PROPOFOL  N/A 01/15/2017   Procedure: ESOPHAGOGASTRODUODENOSCOPY (EGD) WITH PROPOFOL ;  Surgeon: Ruby Corporal, MD;  [facility];  Service: Endoscopy;  Laterality: N/A;  9:15   ESOPHAGOGASTRODUODENOSCOPY (EGD) WITH PROPOFOL  N/A 05/12/2019   Procedure: ESOPHAGOGASTRODUODENOSCOPY (EGD) WITH PROPOFOL ;  Surgeon: Ruby Corporal, MD;  [facility];  Service: Endoscopy;  Laterality: N/A;  855am   NISSEN FUNDOPLICATION     PACEMAKER INSERTION     St Jude   PPM GENERATOR CHANGEOUT N/A 04/09/2020   Procedure: PPM GENERATOR CHANGEOUT;  Surgeon: Jolly Needle, MD;  [facility];  Service:  Cardiovascular;  Laterality: N/A;   SHOULDER SURGERY     SPLENECTOMY     FAMILY HISTORY No family history on file. SOCIAL HISTORY Social History   Tobacco Use   Smoking status: Former    Current packs/day: 0.00    Average packs/day: 2.0 packs/day for 48.8 years (97.5 ttl pk-yrs)    Types: Cigarettes    Start date: 11/13/1963    Quit date: 08/18/2012    Years since quitting: 11.3   Smokeless tobacco: Never   Tobacco comments:    I am pleased that she recently quit.  Vaping Use   Vaping status: Never Used  Substance Use Topics   Alcohol use: No    Alcohol/week: 0.0 standard drinks of alcohol    Comment: Former heavy drinker   Drug use: No    Comment: Former benodiazepine dependence       OPHTHALMIC EXAM:  Not recorded    IMAGING AND PROCEDURES  Imaging and Procedures for 12/17/2023         ASSESSMENT/PLAN: No diagnosis found.  Choroidal Nevus, OS - flat, irregular, pigmented lesion along IT arcades, ~3DD - baseline Topcon and optos taken today 11.22.24 - OCT shows OD: WNL, OS: Focal hyper reflection choroidal lesion inferior macula along IT arcades-- no elevation  - no visual symptoms, no drusen, SRF or orange pigment  - thickness < 2mm -- essentially flat  - discussed findings, prognosis  - recommend monitoring  - f/u 4-6 month, DFE, OCT, optos color photos   2,3. Hypertensive retinopathy OU - discussed importance of tight BP control - monitor   4.Pseudophakia OU  - s/p CE/IOL OU in [place], [place]  - IOL in good position, doing well  - monitor   Ophthalmic Meds Ordered this visit:  No orders of the defined types were placed in this encounter.    No follow-ups on file.  There are no Patient Instructions on file for this visit.  This document serves as a record of services personally performed by Jeanice Millard, MD, PhD. It was created on their behalf by Eller Gut COT, an ophthalmic technician. The creation of this record is the provider's  dictation and/or activities during the visit.    Electronically signed by: Eller Gut COT 05.21.25 10:12 AM  Abbreviations: M myopia (nearsighted); A astigmatism; H hyperopia (farsighted); P presbyopia; Mrx spectacle prescription;  CTL contact lenses; OD right eye; OS left eye; OU both eyes  XT exotropia; ET esotropia; PEK punctate epithelial keratitis; PEE punctate epithelial erosions; DES dry eye syndrome; MGD meibomian gland dysfunction; ATs artificial tears; PFAT's preservative free artificial tears; NSC nuclear sclerotic cataract; PSC posterior subcapsular cataract; ERM epi-retinal membrane; PVD posterior vitreous detachment; RD retinal detachment; DM diabetes mellitus; DR diabetic retinopathy; NPDR non-proliferative diabetic retinopathy; PDR proliferative diabetic retinopathy; CSME clinically significant macular edema; DME diabetic macular edema; dbh dot blot hemorrhages;  CWS cotton wool spot; POAG primary open angle glaucoma; C/D cup-to-disc ratio; HVF humphrey visual field; GVF goldmann visual field; OCT optical coherence tomography; IOP intraocular pressure; BRVO Branch retinal vein occlusion; CRVO central retinal vein occlusion; CRAO central retinal artery occlusion; BRAO branch retinal artery occlusion; RT retinal tear; SB scleral buckle; PPV pars plana vitrectomy; VH Vitreous hemorrhage; PRP panretinal laser photocoagulation; IVK intravitreal kenalog; VMT vitreomacular traction; MH Macular hole;  NVD neovascularization of the disc; NVE neovascularization elsewhere; AREDS age related eye disease study; ARMD age related macular degeneration; POAG primary open angle glaucoma; EBMD epithelial/anterior basement membrane dystrophy; ACIOL anterior chamber intraocular lens; IOL intraocular lens; PCIOL posterior chamber intraocular lens; Phaco/IOL phacoemulsification with intraocular lens placement; PRK photorefractive keratectomy; LASIK laser assisted in situ keratomileusis; HTN hypertension; DM  diabetes mellitus; COPD chronic obstructive pulmonary disease

## 2023-12-17 ENCOUNTER — Encounter (INDEPENDENT_AMBULATORY_CARE_PROVIDER_SITE_OTHER): Payer: Medicare Other | Admitting: Ophthalmology

## 2024-01-04 ENCOUNTER — Ambulatory Visit (INDEPENDENT_AMBULATORY_CARE_PROVIDER_SITE_OTHER): Payer: Self-pay

## 2024-01-04 DIAGNOSIS — I495 Sick sinus syndrome: Secondary | ICD-10-CM

## 2024-01-04 NOTE — Progress Notes (Shared)
 [facility] - Clinic Note  01/17/2024   CHIEF COMPLAINT Patient presents for No chief complaint on file.  HISTORY OF PRESENT ILLNESS: [name] is a 78 y.o. female who presents to the clinic today for:    Referring physician: Patria Bookbinder, OD [address] [place] [place],  [place] [address]  HISTORICAL INFORMATION:  Selected notes from the MEDICAL RECORD NUMBER Referred by Dr. Nolon Baxter for eval of choroidal nevus OS LEE:  Ocular Hx- PMH-   CURRENT MEDICATIONS: No current outpatient medications on file. (Ophthalmic Drugs)   No current facility-administered medications for this visit. (Ophthalmic Drugs)   Current Outpatient Medications (Other)  Medication Sig   albuterol  (VENTOLIN  HFA) 108 (90 Base) MCG/ACT inhaler Inhale 2 puffs into the lungs every 6 (six) hours as needed for wheezing or shortness of breath.   ALPRAZolam (XANAX) 1 MG tablet Take 1 mg by mouth 2 (two) times daily.   amLODipine (NORVASC) 2.5 MG tablet Take 2.5 mg by mouth daily.   Ascorbic Acid (VITAMIN C) 1000 MG tablet Take 1,000 mg by mouth daily.   b complex vitamins tablet Take 1 tablet by mouth daily.   Cholecalciferol (DIALYVITE VITAMIN D 5000) 125 MCG (5000 UT) capsule Take 5,000 Units by mouth daily.   famotidine (PEPCID) 40 MG tablet Take 40 mg by mouth 2 (two) times daily.   folic acid (FOLVITE) 1 MG tablet Take 1 mg by mouth daily.   gabapentin (NEURONTIN) 400 MG capsule Take 400-800 mg by mouth See admin instructions. Take 1 capsule (400 mg) by mouth in the morning & take 2 capsules (800 mg) by mouth at night.   ipratropium-albuterol  (DUONEB) 0.5-2.5 (3) MG/3ML SOLN Take 3 mLs by nebulization every 4 (four) hours as needed (shortness of breath).   levothyroxine (SYNTHROID) 25 MCG tablet Take 25 mcg by mouth every morning.   Omega-3 Fatty Acids (FISH OIL) 1000 MG CAPS Take 1,000 mg by mouth 3 (three) times daily.    sertraline (ZOLOFT) 50 MG tablet Take 50 mg by mouth daily.    Vitamin A 2400 MCG (8000 UT) CAPS Take 2,400 mcg by mouth daily.   vitamin B-12 (CYANOCOBALAMIN) 50 MCG tablet Take 50 mcg by mouth daily.   VITAMIN E PO Take 1 capsule by mouth daily.   No current facility-administered medications for this visit. (Other)   REVIEW OF SYSTEMS:   ALLERGIES No Known Allergies PAST MEDICAL HISTORY Past Medical History:  Diagnosis Date   Chronic pain    COPD with emphysema (HCC)    Depression    GERD (gastroesophageal reflux disease)    Hypertension    Mitral and aortic regurgitation    Mild   Neurodermatitis    Peptic ulcer    Sick sinus syndrome (HCC)    s/p PPM (SJM)   Syncope    Past Surgical History:  Procedure Laterality Date   APPENDECTOMY     BIOPSY  05/12/2019   Procedure: BIOPSY;  Surgeon: Ruby Corporal, MD;  [facility];  Service: Endoscopy;;  antral, pre-pyloric mucosa   CARPAL TUNNEL RELEASE     CHOLECYSTECTOMY     COLONOSCOPY WITH PROPOFOL  N/A 03/03/2019   Procedure: COLONOSCOPY WITH PROPOFOL ;  Surgeon: Ruby Corporal, MD;  [facility];  Service: Endoscopy;  Laterality: N/A;  9:50   ESOPHAGEAL DILATION N/A 01/15/2017   Procedure: ESOPHAGEAL DILATION;  Surgeon: Ruby Corporal, MD;  [facility];  Service: Endoscopy;  Laterality: N/A;  ESOPHAGOGASTRODUODENOSCOPY (EGD) WITH PROPOFOL  N/A 01/15/2017   Procedure: ESOPHAGOGASTRODUODENOSCOPY (EGD) WITH PROPOFOL ;  Surgeon: Ruby Corporal, MD;  [facility];  Service: Endoscopy;  Laterality: N/A;  9:15   ESOPHAGOGASTRODUODENOSCOPY (EGD) WITH PROPOFOL  N/A 05/12/2019   Procedure: ESOPHAGOGASTRODUODENOSCOPY (EGD) WITH PROPOFOL ;  Surgeon: Ruby Corporal, MD;  [facility];  Service: Endoscopy;  Laterality: N/A;  855am   NISSEN FUNDOPLICATION     PACEMAKER INSERTION     St Jude   PPM GENERATOR CHANGEOUT N/A 04/09/2020   Procedure: PPM GENERATOR CHANGEOUT;  Surgeon: Jolly Needle, MD;  [facility];  Service:  Cardiovascular;  Laterality: N/A;   SHOULDER SURGERY     SPLENECTOMY     FAMILY HISTORY No family history on file. SOCIAL HISTORY Social History   Tobacco Use   Smoking status: Former    Current packs/day: 0.00    Average packs/day: 2.0 packs/day for 48.8 years (97.5 ttl pk-yrs)    Types: Cigarettes    Start date: 11/13/1963    Quit date: 08/18/2012    Years since quitting: 11.3   Smokeless tobacco: Never   Tobacco comments:    I am pleased that she recently quit.  Vaping Use   Vaping status: Never Used  Substance Use Topics   Alcohol use: No    Alcohol/week: 0.0 standard drinks of alcohol    Comment: Former heavy drinker   Drug use: No    Comment: Former benodiazepine dependence       OPHTHALMIC EXAM:  Not recorded    IMAGING AND PROCEDURES  Imaging and Procedures for 01/17/2024         ASSESSMENT/PLAN: No diagnosis found.  Choroidal Nevus, OS - flat, irregular, pigmented lesion along IT arcades, ~3DD - baseline Topcon and optos taken today 11.22.24 - OCT shows OD: WNL, OS: Focal hyper reflection choroidal lesion inferior macula along IT arcades-- no elevation  - no visual symptoms, no drusen, SRF or orange pigment  - thickness < 2mm -- essentially flat  - discussed findings, prognosis  - recommend monitoring  - f/u 4-6 month, DFE, OCT, optos color photos    2,3. Hypertensive retinopathy OU - discussed importance of tight BP control - monitor   4.Pseudophakia OU  - s/p CE/IOL OU in [place], [place]  - IOL in good position, doing well  - monitor  Ophthalmic Meds Ordered this visit:  No orders of the defined types were placed in this encounter.    No follow-ups on file.  There are no Patient Instructions on file for this visit.  Explained the diagnoses, plan, and follow up with the patient and they expressed understanding.  Patient expressed understanding of the importance of proper follow up care.   This document serves as a record of services  personally performed by Jeanice Millard, MD, PhD. It was created on their behalf by Mayola Specking, COA an ophthalmic technician. The creation of this record is the provider's dictation and/or activities during the visit.   Electronically signed by: Carrington Clack, COT  01/04/24  2:46 PM    Jeanice Millard, M.D., Ph.D. Diseases & Surgery of the Retina and Vitreous [facility] 01/17/2024     Abbreviations: M myopia (nearsighted); A astigmatism; H hyperopia (farsighted); P presbyopia; Mrx spectacle prescription;  CTL contact lenses; OD right eye; OS left eye; OU both eyes  XT exotropia; ET esotropia; PEK punctate epithelial keratitis; PEE punctate epithelial erosions; DES dry eye syndrome; MGD  meibomian gland dysfunction; ATs artificial tears; PFAT's preservative free artificial tears; NSC nuclear sclerotic cataract; PSC posterior subcapsular cataract; ERM epi-retinal membrane; PVD posterior vitreous detachment; RD retinal detachment; DM diabetes mellitus; DR diabetic retinopathy; NPDR non-proliferative diabetic retinopathy; PDR proliferative diabetic retinopathy; CSME clinically significant macular edema; DME diabetic macular edema; dbh dot blot hemorrhages; CWS cotton wool spot; POAG primary open angle glaucoma; C/D cup-to-disc ratio; HVF humphrey visual field; GVF goldmann visual field; OCT optical coherence tomography; IOP intraocular pressure; BRVO Branch retinal vein occlusion; CRVO central retinal vein occlusion; CRAO central retinal artery occlusion; BRAO branch retinal artery occlusion; RT retinal tear; SB scleral buckle; PPV pars plana vitrectomy; VH Vitreous hemorrhage; PRP panretinal laser photocoagulation; IVK intravitreal kenalog; VMT vitreomacular traction; MH Macular hole;  NVD neovascularization of the disc; NVE neovascularization elsewhere; AREDS age related eye disease study; ARMD age related macular degeneration; POAG primary open angle glaucoma; EBMD  epithelial/anterior basement membrane dystrophy; ACIOL anterior chamber intraocular lens; IOL intraocular lens; PCIOL posterior chamber intraocular lens; Phaco/IOL phacoemulsification with intraocular lens placement; PRK photorefractive keratectomy; LASIK laser assisted in situ keratomileusis; HTN hypertension; DM diabetes mellitus; COPD chronic obstructive pulmonary disease

## 2024-01-05 LAB — CUP PACEART REMOTE DEVICE CHECK
Battery Remaining Longevity: 89 mo
Battery Remaining Percentage: 69 %
Battery Voltage: 3.01 V
Brady Statistic AP VP Percent: 1 %
Brady Statistic AP VS Percent: 62 %
Brady Statistic AS VP Percent: 1 %
Brady Statistic AS VS Percent: 38 %
Brady Statistic RA Percent Paced: 62 %
Brady Statistic RV Percent Paced: 1 %
Date Time Interrogation Session: 20250610060041
Implantable Lead Connection Status: 753985
Implantable Lead Connection Status: 753985
Implantable Lead Implant Date: 20100908
Implantable Lead Implant Date: 20100908
Implantable Lead Location: 753859
Implantable Lead Location: 753860
Implantable Lead Model: 1944
Implantable Lead Model: 1948
Implantable Pulse Generator Implant Date: 20210914
Lead Channel Impedance Value: 480 Ohm
Lead Channel Impedance Value: 600 Ohm
Lead Channel Pacing Threshold Amplitude: 0.5 V
Lead Channel Pacing Threshold Amplitude: 0.5 V
Lead Channel Pacing Threshold Pulse Width: 0.4 ms
Lead Channel Pacing Threshold Pulse Width: 0.5 ms
Lead Channel Sensing Intrinsic Amplitude: 12 mV
Lead Channel Sensing Intrinsic Amplitude: 3.4 mV
Lead Channel Setting Pacing Amplitude: 0.75 V
Lead Channel Setting Pacing Amplitude: 1.5 V
Lead Channel Setting Pacing Pulse Width: 0.4 ms
Lead Channel Setting Sensing Sensitivity: 2 mV
Pulse Gen Model: 2272
Pulse Gen Serial Number: 3863879

## 2024-01-07 ENCOUNTER — Ambulatory Visit: Payer: Self-pay | Admitting: Cardiovascular Disease

## 2024-01-17 ENCOUNTER — Encounter (INDEPENDENT_AMBULATORY_CARE_PROVIDER_SITE_OTHER): Admitting: Ophthalmology

## 2024-01-17 DIAGNOSIS — Z961 Presence of intraocular lens: Secondary | ICD-10-CM

## 2024-01-17 DIAGNOSIS — I10 Essential (primary) hypertension: Secondary | ICD-10-CM

## 2024-01-17 DIAGNOSIS — H35033 Hypertensive retinopathy, bilateral: Secondary | ICD-10-CM

## 2024-01-17 DIAGNOSIS — D3132 Benign neoplasm of left choroid: Secondary | ICD-10-CM

## 2024-02-03 ENCOUNTER — Other Ambulatory Visit: Payer: Self-pay

## 2024-02-03 ENCOUNTER — Emergency Department (HOSPITAL_COMMUNITY)

## 2024-02-03 ENCOUNTER — Inpatient Hospital Stay (HOSPITAL_COMMUNITY)
Admission: EM | Admit: 2024-02-03 | Discharge: 2024-02-08 | DRG: 521 | Disposition: A | Discharge status: Home Health | Attending: Internal Medicine | Admitting: Internal Medicine

## 2024-02-03 ENCOUNTER — Encounter (HOSPITAL_COMMUNITY): Payer: Self-pay

## 2024-02-03 DIAGNOSIS — Z8249 Family history of ischemic heart disease and other diseases of the circulatory system: Secondary | ICD-10-CM

## 2024-02-03 DIAGNOSIS — I10 Essential (primary) hypertension: Secondary | ICD-10-CM | POA: Diagnosis present

## 2024-02-03 DIAGNOSIS — J449 Chronic obstructive pulmonary disease, unspecified: Secondary | ICD-10-CM | POA: Diagnosis not present

## 2024-02-03 DIAGNOSIS — E876 Hypokalemia: Secondary | ICD-10-CM | POA: Diagnosis present

## 2024-02-03 DIAGNOSIS — J439 Emphysema, unspecified: Secondary | ICD-10-CM | POA: Diagnosis present

## 2024-02-03 DIAGNOSIS — D72829 Elevated white blood cell count, unspecified: Secondary | ICD-10-CM | POA: Diagnosis present

## 2024-02-03 DIAGNOSIS — Z7951 Long term (current) use of inhaled steroids: Secondary | ICD-10-CM | POA: Diagnosis not present

## 2024-02-03 DIAGNOSIS — S72012A Unspecified intracapsular fracture of left femur, initial encounter for closed fracture: Secondary | ICD-10-CM | POA: Diagnosis not present

## 2024-02-03 DIAGNOSIS — Y92008 Other place in unspecified non-institutional (private) residence as the place of occurrence of the external cause: Secondary | ICD-10-CM | POA: Diagnosis not present

## 2024-02-03 DIAGNOSIS — S72002A Fracture of unspecified part of neck of left femur, initial encounter for closed fracture: Secondary | ICD-10-CM | POA: Diagnosis not present

## 2024-02-03 DIAGNOSIS — I495 Sick sinus syndrome: Secondary | ICD-10-CM | POA: Diagnosis not present

## 2024-02-03 DIAGNOSIS — J690 Pneumonitis due to inhalation of food and vomit: Secondary | ICD-10-CM | POA: Diagnosis not present

## 2024-02-03 DIAGNOSIS — Z7901 Long term (current) use of anticoagulants: Secondary | ICD-10-CM | POA: Diagnosis not present

## 2024-02-03 DIAGNOSIS — F32A Depression, unspecified: Secondary | ICD-10-CM | POA: Diagnosis present

## 2024-02-03 DIAGNOSIS — M25552 Pain in left hip: Secondary | ICD-10-CM | POA: Diagnosis not present

## 2024-02-03 DIAGNOSIS — W010XXA Fall on same level from slipping, tripping and stumbling without subsequent striking against object, initial encounter: Secondary | ICD-10-CM | POA: Diagnosis present

## 2024-02-03 DIAGNOSIS — R609 Edema, unspecified: Secondary | ICD-10-CM | POA: Diagnosis not present

## 2024-02-03 DIAGNOSIS — S299XXA Unspecified injury of thorax, initial encounter: Secondary | ICD-10-CM | POA: Diagnosis not present

## 2024-02-03 DIAGNOSIS — R0989 Other specified symptoms and signs involving the circulatory and respiratory systems: Secondary | ICD-10-CM | POA: Diagnosis not present

## 2024-02-03 DIAGNOSIS — D500 Iron deficiency anemia secondary to blood loss (chronic): Secondary | ICD-10-CM | POA: Diagnosis present

## 2024-02-03 DIAGNOSIS — Z79899 Other long term (current) drug therapy: Secondary | ICD-10-CM | POA: Diagnosis not present

## 2024-02-03 DIAGNOSIS — Z9081 Acquired absence of spleen: Secondary | ICD-10-CM

## 2024-02-03 DIAGNOSIS — E039 Hypothyroidism, unspecified: Secondary | ICD-10-CM | POA: Diagnosis not present

## 2024-02-03 DIAGNOSIS — M160 Bilateral primary osteoarthritis of hip: Secondary | ICD-10-CM | POA: Diagnosis not present

## 2024-02-03 DIAGNOSIS — I517 Cardiomegaly: Secondary | ICD-10-CM | POA: Diagnosis not present

## 2024-02-03 DIAGNOSIS — R0609 Other forms of dyspnea: Secondary | ICD-10-CM | POA: Diagnosis not present

## 2024-02-03 DIAGNOSIS — Z7952 Long term (current) use of systemic steroids: Secondary | ICD-10-CM | POA: Diagnosis not present

## 2024-02-03 DIAGNOSIS — J849 Interstitial pulmonary disease, unspecified: Secondary | ICD-10-CM | POA: Diagnosis not present

## 2024-02-03 DIAGNOSIS — Z471 Aftercare following joint replacement surgery: Secondary | ICD-10-CM | POA: Diagnosis not present

## 2024-02-03 DIAGNOSIS — J9601 Acute respiratory failure with hypoxia: Secondary | ICD-10-CM | POA: Diagnosis present

## 2024-02-03 DIAGNOSIS — K219 Gastro-esophageal reflux disease without esophagitis: Secondary | ICD-10-CM | POA: Diagnosis present

## 2024-02-03 DIAGNOSIS — Z950 Presence of cardiac pacemaker: Secondary | ICD-10-CM | POA: Diagnosis not present

## 2024-02-03 DIAGNOSIS — S72001A Fracture of unspecified part of neck of right femur, initial encounter for closed fracture: Secondary | ICD-10-CM | POA: Diagnosis not present

## 2024-02-03 DIAGNOSIS — Z79890 Hormone replacement therapy (postmenopausal): Secondary | ICD-10-CM | POA: Diagnosis not present

## 2024-02-03 DIAGNOSIS — F419 Anxiety disorder, unspecified: Secondary | ICD-10-CM | POA: Diagnosis present

## 2024-02-03 DIAGNOSIS — E785 Hyperlipidemia, unspecified: Secondary | ICD-10-CM | POA: Diagnosis not present

## 2024-02-03 DIAGNOSIS — R9389 Abnormal findings on diagnostic imaging of other specified body structures: Secondary | ICD-10-CM | POA: Diagnosis not present

## 2024-02-03 DIAGNOSIS — Z96642 Presence of left artificial hip joint: Secondary | ICD-10-CM | POA: Diagnosis not present

## 2024-02-03 DIAGNOSIS — Z87891 Personal history of nicotine dependence: Secondary | ICD-10-CM | POA: Diagnosis not present

## 2024-02-03 DIAGNOSIS — R918 Other nonspecific abnormal finding of lung field: Secondary | ICD-10-CM | POA: Diagnosis not present

## 2024-02-03 DIAGNOSIS — J441 Chronic obstructive pulmonary disease with (acute) exacerbation: Secondary | ICD-10-CM | POA: Diagnosis present

## 2024-02-03 LAB — CBC WITH DIFFERENTIAL/PLATELET
Abs Immature Granulocytes: 0.3 K/uL — ABNORMAL HIGH (ref 0.00–0.07)
Basophils Absolute: 0.1 K/uL (ref 0.0–0.1)
Basophils Relative: 0 %
Eosinophils Absolute: 0 K/uL (ref 0.0–0.5)
Eosinophils Relative: 0 %
HCT: 38 % (ref 36.0–46.0)
Hemoglobin: 12.5 g/dL (ref 12.0–15.0)
Immature Granulocytes: 1 %
Lymphocytes Relative: 3 %
Lymphs Abs: 0.8 K/uL (ref 0.7–4.0)
MCH: 34.3 pg — ABNORMAL HIGH (ref 26.0–34.0)
MCHC: 32.9 g/dL (ref 30.0–36.0)
MCV: 104.4 fL — ABNORMAL HIGH (ref 80.0–100.0)
Monocytes Absolute: 1.9 K/uL — ABNORMAL HIGH (ref 0.1–1.0)
Monocytes Relative: 6 %
Neutro Abs: 28.4 K/uL — ABNORMAL HIGH (ref 1.7–7.7)
Neutrophils Relative %: 90 %
Platelets: 328 K/uL (ref 150–400)
RBC: 3.64 MIL/uL — ABNORMAL LOW (ref 3.87–5.11)
RDW: 14.5 % (ref 11.5–15.5)
WBC: 31.5 K/uL — ABNORMAL HIGH (ref 4.0–10.5)
nRBC: 0 % (ref 0.0–0.2)

## 2024-02-03 LAB — TYPE AND SCREEN
ABO/RH(D): B POS
Antibody Screen: NEGATIVE

## 2024-02-03 LAB — BASIC METABOLIC PANEL WITH GFR
Anion gap: 16 — ABNORMAL HIGH (ref 5–15)
BUN: 19 mg/dL (ref 8–23)
CO2: 25 mmol/L (ref 22–32)
Calcium: 9 mg/dL (ref 8.9–10.3)
Chloride: 95 mmol/L — ABNORMAL LOW (ref 98–111)
Creatinine, Ser: 0.93 mg/dL (ref 0.44–1.00)
GFR, Estimated: >60 mL/min (ref >60)
Glucose, Bld: 218 mg/dL — ABNORMAL HIGH (ref 70–99)
Potassium: 3.4 mmol/L — ABNORMAL LOW (ref 3.5–5.1)
Sodium: 136 mmol/L (ref 135–145)

## 2024-02-03 LAB — PROTIME-INR
INR: 0.9 (ref 0.8–1.2)
Prothrombin Time: 13 s (ref 11.4–15.2)

## 2024-02-03 MED ORDER — FAMOTIDINE 20 MG PO TABS
40.0000 mg | ORAL_TABLET | Freq: Two times a day (BID) | ORAL | Status: DC
  Administered 2024-02-03 – 2024-02-08 (×10): 40 mg via ORAL
  Filled 2024-02-03 (×10): qty 2

## 2024-02-03 MED ORDER — LEVOTHYROXINE SODIUM 25 MCG PO TABS
25.0000 ug | ORAL_TABLET | Freq: Every day | ORAL | Status: DC
  Administered 2024-02-04 – 2024-02-08 (×5): 25 ug via ORAL
  Filled 2024-02-03 (×5): qty 1

## 2024-02-03 MED ORDER — GABAPENTIN 400 MG PO CAPS
400.0000 mg | ORAL_CAPSULE | Freq: Every day | ORAL | Status: DC
  Administered 2024-02-05 – 2024-02-08 (×4): 400 mg via ORAL
  Filled 2024-02-03 (×4): qty 1

## 2024-02-03 MED ORDER — ONDANSETRON HCL 4 MG/2ML IJ SOLN
4.0000 mg | Freq: Four times a day (QID) | INTRAMUSCULAR | Status: DC | PRN
  Administered 2024-02-03 – 2024-02-05 (×4): 4 mg via INTRAVENOUS
  Filled 2024-02-03 (×3): qty 2

## 2024-02-03 MED ORDER — TIZANIDINE HCL 2 MG PO TABS
2.0000 mg | ORAL_TABLET | Freq: Two times a day (BID) | ORAL | Status: DC
  Administered 2024-02-03 – 2024-02-08 (×9): 2 mg via ORAL
  Filled 2024-02-03 (×9): qty 1

## 2024-02-03 MED ORDER — POTASSIUM CHLORIDE IN NACL 40-0.9 MEQ/L-% IV SOLN
INTRAVENOUS | Status: AC
  Filled 2024-02-03 (×2): qty 1000

## 2024-02-03 MED ORDER — FENTANYL CITRATE PF 50 MCG/ML IJ SOSY
50.0000 ug | PREFILLED_SYRINGE | Freq: Once | INTRAMUSCULAR | Status: AC
  Administered 2024-02-03: 50 ug via INTRAVENOUS
  Filled 2024-02-03: qty 1

## 2024-02-03 MED ORDER — POLYETHYLENE GLYCOL 3350 17 G PO PACK: 17.0000 g | PACK | Freq: Every day | ORAL | Status: DC | PRN

## 2024-02-03 MED ORDER — PREDNISONE 10 MG PO TABS
10.0000 mg | ORAL_TABLET | Freq: Every day | ORAL | Status: DC
  Administered 2024-02-04 – 2024-02-06 (×3): 10 mg via ORAL
  Filled 2024-02-03 (×3): qty 1

## 2024-02-03 MED ORDER — MORPHINE SULFATE (PF) 2 MG/ML IV SOLN
2.0000 mg | INTRAVENOUS | Status: DC | PRN
  Administered 2024-02-03: 2 mg via INTRAVENOUS
  Filled 2024-02-03: qty 1

## 2024-02-03 MED ORDER — SERTRALINE HCL 50 MG PO TABS
50.0000 mg | ORAL_TABLET | Freq: Every day | ORAL | Status: DC
  Administered 2024-02-04 – 2024-02-08 (×5): 50 mg via ORAL
  Filled 2024-02-03 (×5): qty 1

## 2024-02-03 MED ORDER — ALPRAZOLAM 1 MG PO TABS
1.0000 mg | ORAL_TABLET | Freq: Two times a day (BID) | ORAL | Status: DC
  Administered 2024-02-03 – 2024-02-06 (×5): 1 mg via ORAL
  Filled 2024-02-03: qty 2
  Filled 2024-02-03: qty 4
  Filled 2024-02-03: qty 1
  Filled 2024-02-03: qty 4
  Filled 2024-02-03: qty 1

## 2024-02-03 MED ORDER — FENTANYL CITRATE PF 50 MCG/ML IJ SOSY
50.0000 ug | PREFILLED_SYRINGE | INTRAMUSCULAR | Status: AC | PRN
  Administered 2024-02-03 (×2): 50 ug via INTRAVENOUS
  Filled 2024-02-03 (×2): qty 1

## 2024-02-03 MED ORDER — IPRATROPIUM-ALBUTEROL 0.5-2.5 (3) MG/3ML IN SOLN: 3.0000 mL | RESPIRATORY_TRACT | Status: DC | PRN

## 2024-02-03 MED ORDER — GABAPENTIN 400 MG PO CAPS
800.0000 mg | ORAL_CAPSULE | Freq: Every day | ORAL | Status: DC
  Administered 2024-02-03 – 2024-02-07 (×5): 800 mg via ORAL
  Filled 2024-02-03 (×5): qty 2

## 2024-02-03 MED ORDER — ACETAMINOPHEN 650 MG RE SUPP: 650.0000 mg | Freq: Four times a day (QID) | RECTAL | Status: DC | PRN

## 2024-02-03 MED ORDER — ONDANSETRON HCL 4 MG/2ML IJ SOLN
4.0000 mg | Freq: Once | INTRAMUSCULAR | Status: AC
  Administered 2024-02-03: 4 mg via INTRAVENOUS
  Filled 2024-02-03: qty 2

## 2024-02-03 MED ORDER — SODIUM CHLORIDE 0.9 % IV SOLN
500.0000 mg | INTRAVENOUS | Status: DC
  Administered 2024-02-04 – 2024-02-08 (×5): 500 mg via INTRAVENOUS
  Filled 2024-02-03 (×5): qty 5

## 2024-02-03 MED ORDER — SODIUM CHLORIDE 0.9 % IV SOLN
2.0000 g | INTRAVENOUS | Status: DC
  Administered 2024-02-04 – 2024-02-08 (×5): 2 g via INTRAVENOUS
  Filled 2024-02-03 (×5): qty 20

## 2024-02-03 MED ORDER — ACETAMINOPHEN 325 MG PO TABS: 650.0000 mg | ORAL_TABLET | Freq: Four times a day (QID) | ORAL | Status: DC | PRN

## 2024-02-03 MED ORDER — FLUTICASONE FUROATE-VILANTEROL 200-25 MCG/ACT IN AEPB
1.0000 | INHALATION_SPRAY | Freq: Every day | RESPIRATORY_TRACT | Status: DC
  Administered 2024-02-04 – 2024-02-08 (×5): 1 via RESPIRATORY_TRACT
  Filled 2024-02-03: qty 28

## 2024-02-03 MED ORDER — ONDANSETRON HCL 4 MG PO TABS: 4.0000 mg | ORAL_TABLET | Freq: Four times a day (QID) | ORAL | Status: DC | PRN

## 2024-02-03 NOTE — Assessment & Plan Note (Addendum)
 Status post mechanical fall. Mildly displaced and impacted subcapital fracture of left femoral neck. - EDP talked to Dr. Margrette, will see in consult in a.m. -IV morphine  2 mg Q4 hourly as needed - N/s + 40 kcl 100cc/hr x 15hrs - N.p.o. midnight

## 2024-02-03 NOTE — H&P (Signed)
 History and Physical    [name] FMW:[id number] DOB: January 21, 1946 DOA: 02/03/2024  PCP: Orpha Yancey LABOR, MD   Patient coming from: Home  I have personally briefly reviewed patient's old medical records in [facility]  Chief Complaint: Fall  HPI: [name] is a 78 y.o. female with medical history significant for COPD, hypertension, tobacco abuse, SSS. Patient presented to the ED with complaints of fall.  She reports about 3 falls in the past 2 weeks.  She reports feeling weak when she gets up in the morning.  She was alone with her 53-year-old grandchild who was watching when she fell onto her left hip.  She believes she tripped over a toy. No dizziness no chest pain no difficulty breathing.  Reports good oral intake.  No vomiting or diarrhea.  No urinary symptoms.  No headache or neck pain.  ED Course: Temperature 98.3.  Heart rate 77-89.  Respirate rate 13-27.  Blood pressure systolic 169-195.  O2 sats 97% on room air. Leukocytosis of 31.5.  Potassium 3.4. X-ray of the hip shows mildly displaced and impacted subcapital fracture of the left femoral neck. Chest x-ray shows *Mildly increased interstitial markings throughout bilateral lungs, which is nonspecific. Differential diagnosis includes interstitial lung disease, mild pulmonary edema, atypical pneumonia, etc. No dense consolidation or lung collapse.  Review of Systems: As per HPI all other systems reviewed and negative.  Past Medical History:  Diagnosis Date   Chronic pain    COPD with emphysema (HCC)    Depression    GERD (gastroesophageal reflux disease)    Hypertension    Mitral and aortic regurgitation    Mild   Neurodermatitis    Peptic ulcer    Sick sinus syndrome (HCC)    s/p PPM (SJM)   Syncope     Past Surgical History:  Procedure Laterality Date   APPENDECTOMY     BIOPSY  05/12/2019   Procedure: BIOPSY;  Surgeon: Golda Claudis PENNER, MD;  Location: [facility];  Service: Endoscopy;;  antral,  pre-pyloric mucosa   CARPAL TUNNEL RELEASE     CHOLECYSTECTOMY     COLONOSCOPY WITH PROPOFOL  N/A 03/03/2019   Procedure: COLONOSCOPY WITH PROPOFOL ;  Surgeon: Golda Claudis PENNER, MD;  Location: [facility];  Service: Endoscopy;  Laterality: N/A;  9:50   ESOPHAGEAL DILATION N/A 01/15/2017   Procedure: ESOPHAGEAL DILATION;  Surgeon: Golda Claudis PENNER, MD;  Location: [facility];  Service: Endoscopy;  Laterality: N/A;   ESOPHAGOGASTRODUODENOSCOPY (EGD) WITH PROPOFOL  N/A 01/15/2017   Procedure: ESOPHAGOGASTRODUODENOSCOPY (EGD) WITH PROPOFOL ;  Surgeon: Golda Claudis PENNER, MD;  Location: [facility];  Service: Endoscopy;  Laterality: N/A;  9:15   ESOPHAGOGASTRODUODENOSCOPY (EGD) WITH PROPOFOL  N/A 05/12/2019   Procedure: ESOPHAGOGASTRODUODENOSCOPY (EGD) WITH PROPOFOL ;  Surgeon: Golda Claudis PENNER, MD;  Location: [facility];  Service: Endoscopy;  Laterality: N/A;  855am   NISSEN FUNDOPLICATION     PACEMAKER INSERTION     St Jude   PPM GENERATOR CHANGEOUT N/A 04/09/2020   Procedure: PPM GENERATOR CHANGEOUT;  Surgeon: Kelsie Agent, MD;  Location: [facility];  Service: Cardiovascular;  Laterality: N/A;   SHOULDER SURGERY     SPLENECTOMY       reports that she quit smoking about 11 years ago. Her smoking use included cigarettes. She started smoking about 60 years ago. She has a 97.5 pack-year smoking history. She has never used smokeless tobacco. She reports that she does not drink alcohol and does not use drugs.  No Known Allergies  Family history of hypertension.  Prior to Admission medications   Medication Sig Start Date End Date Taking? Authorizing Provider  albuterol  (VENTOLIN  HFA) 108 (90 Base) MCG/ACT inhaler Inhale 2 puffs into the lungs every 6 (six) hours as needed for wheezing or shortness of breath.   Yes [provider]  ALPRAZolam  (XANAX ) 1 MG tablet Take 1 mg by mouth 2 (two) times daily.   Yes [provider]  fluticasone -salmeterol (ADVAIR) 250-50 MCG/ACT  AEPB Inhale 1 puff into the lungs 2 (two) times daily. 11/24/23  Yes [provider]  gabapentin  (NEURONTIN ) 400 MG capsule Take 400-800 mg by mouth See admin instructions. Take 1 capsule (400 mg) by mouth in the morning & take 2 capsules (800 mg) by mouth at night.   Yes [provider]  ipratropium-albuterol  (DUONEB) 0.5-2.5 (3) MG/3ML SOLN Take 3 mLs by nebulization every 4 (four) hours as needed (shortness of breath).   Yes [provider]  tiZANidine  (ZANAFLEX ) 2 MG tablet Take 2 mg by mouth 2 (two) times daily. 01/03/24  Yes [provider]  amLODipine (NORVASC) 2.5 MG tablet Take 2.5 mg by mouth daily. 11/24/22   [provider]  Ascorbic Acid (VITAMIN C) 1000 MG tablet Take 1,000 mg by mouth daily.    [provider]  b complex vitamins tablet Take 1 tablet by mouth daily.    [provider]  Cholecalciferol (DIALYVITE VITAMIN D 5000) 125 MCG (5000 UT) capsule Take 5,000 Units by mouth daily.    [provider]  famotidine  (PEPCID ) 40 MG tablet Take 40 mg by mouth 2 (two) times daily. 12/25/22   [provider]  folic acid (FOLVITE) 1 MG tablet Take 1 mg by mouth daily. 11/05/16   [provider]  levothyroxine  (SYNTHROID ) 25 MCG tablet Take 25 mcg by mouth every morning. 06/16/21   [provider]  Omega-3 Fatty Acids (FISH OIL) 1000 MG CAPS Take 1,000 mg by mouth 3 (three) times daily.     [provider]  sertraline  (ZOLOFT ) 50 MG tablet Take 50 mg by mouth daily.    [provider]  Vitamin A 2400 MCG (8000 UT) CAPS Take 2,400 mcg by mouth daily.    [provider]  vitamin B-12 (CYANOCOBALAMIN) 50 MCG tablet Take 50 mcg by mouth daily.    [provider]  VITAMIN E PO Take 1 capsule by mouth daily.    [provider]    Physical Exam: Vitals:   02/03/24 1713 02/03/24 1721 02/03/24 1830  BP:  (!) 180/74 (!) 195/76  Pulse:  81 83  Resp:  (!) 27 13   Temp:  98.3 F (36.8 C)   TempSrc:  Oral   SpO2:  91% 97%  Weight: 53 kg      Constitutional: NAD, calm, comfortable Vitals:   02/03/24 1713 02/03/24 1721 02/03/24 1830  BP:  (!) 180/74 (!) 195/76  Pulse:  81 83  Resp:  (!) 27 13  Temp:  98.3 F (36.8 C)   TempSrc:  Oral   SpO2:  91% 97%  Weight: 53 kg     Eyes: PERRL, lids and conjunctivae normal ENMT: Mucous membranes are moist. Neck: normal, supple, no masses, no thyromegaly Respiratory: clear to auscultation bilaterally, no wheezing, no crackles. Normal respiratory effort. No accessory muscle use.  Cardiovascular: Regular rate and rhythm, no murmurs / rubs / gallops. No extremity edema.  Extremities warm.  Abdomen: no tenderness, no masses palpated.  No hepatosplenomegaly. Bowel sounds positive.  Musculoskeletal: no clubbing / cyanosis. No joint deformity upper and lower extremities.  Skin: no rashes, lesions, ulcers. No induration Neurologic: No facial asymmetry, moving extremities spontaneously, speech fluent Psychiatric: Normal judgment and insight. Alert and oriented x 3. Normal mood.   Labs on Admission: I have personally reviewed following labs and imaging studies  CBC: Recent Labs  Lab 02/03/24 1731  WBC 31.5*  NEUTROABS 28.4*  HGB 12.5  HCT 38.0  MCV 104.4*  PLT 328   Basic Metabolic Panel: Recent Labs  Lab 02/03/24 1731  NA 136  K 3.4*  CL 95*  CO2 25  GLUCOSE 218*  BUN 19  CREATININE 0.93  CALCIUM 9.0   Coagulation Profile: Recent Labs  Lab 02/03/24 1731  INR 0.9   Radiological Exams on Admission: DG Hip Unilat With Pelvis 2-3 Views Left Result Date: 02/03/2024 CLINICAL DATA:  trauma, fall, short leg. EXAM: DG HIP (WITH OR WITHOUT PELVIS) 2-3V LEFT COMPARISON:  None Available. FINDINGS: Pelvis is intact with normal and symmetric sacroiliac joints. There is mildly displaced and impacted subcapital fracture of left femoral neck. No other acute fracture or dislocation. No aggressive osseous  lesion. Visualized sacral arcuate lines are unremarkable. Unremarkable symphysis pubis. There are mild degenerative changes of bilateral hip joints without significant joint space narrowing. Osteophytosis of the superior acetabulum. No radiopaque foreign bodies. IMPRESSION: *Mildly displaced and impacted subcapital fracture of left femoral neck. Electronically Signed   By: Ree Molt M.D.   On: 02/03/2024 18:07   DG Chest Port 1 View Result Date: 02/03/2024 CLINICAL DATA:  trauma, fall, short leg. EXAM: PORTABLE CHEST 1 VIEW COMPARISON:  04/04/2009. FINDINGS: Note is made of elevated left hemidiaphragm. Low lung volume. There are mildly increased interstitial markings throughout bilateral lungs, which is nonspecific. Differential diagnosis includes interstitial lung disease, mild pulmonary edema, atypical pneumonia, etc. No dense consolidation or lung collapse. Bilateral lateral costophrenic angles are clear. Normal cardio-mediastinal silhouette. There is a left sided 2-lead pacemaker. No acute osseous abnormalities. The soft tissues are within normal limits. There are surgical clips in the right upper quadrant, typical of a previous cholecystectomy. IMPRESSION: *Mildly increased interstitial markings throughout bilateral lungs, which is nonspecific. Differential diagnosis includes interstitial lung disease, mild pulmonary edema, atypical pneumonia, etc. No dense consolidation or lung collapse. Electronically Signed   By: Ree Molt M.D.   On: 02/03/2024 18:06   EKG: Independently reviewed.  Sinus rhythm, rate 83, QTc 4 2  Assessment/Plan Principal Problem:   Closed left hip fracture (HCC) Active Problems:   Leukocytosis   Pacemaker-St.Jude   COPD (chronic obstructive pulmonary disease) (HCC)   Assessment and Plan: * Closed left hip fracture (HCC) Status post mechanical fall. Mildly displaced and impacted subcapital fracture of left femoral neck. - EDP talked to Dr. Margrette, will see in  consult in a.m. -IV morphine  2 mg Q4 hourly as needed - N/s + 40 kcl 100cc/hr x 15hrs - N.p.o. midnight    Leukocytosis Significant leukocytosis of 31.5,with increased absolute neutrophil count.  She is on chronic steroids 10 mg daily. Afebrile. Vitals stable.  At this time, she has no symptoms referable to infectious etiology.  Chest x-ray with increased interstitial markings bilaterally-interstitial lung disease, pulmonary edema or atypical pneumonia. -Considering degree of leukocytosis, which I cannot fully attribute to steroids at this time, will start CAP antibiotics empirically, ceftriaxone and azithromycin - Low threshold to discontinue antibiotics -Check procalcitonin - Follow-up UA - ??  Will need to confirm  indication for chronic steroids  COPD (chronic obstructive pulmonary disease) (HCC) Stable. - Resume home regimen - Nebs as needed  Hypokalemia -Mild, 3.4.  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 Disposition Plan: ~ 2 days Consults called: Ortho Admission status: Inpt Medsurg I certify that at the point of admission it is my clinical judgment that the patient will require inpatient hospital care spanning beyond 2 midnights from the point of admission due to high intensity of service, high risk for further deterioration and high frequency of surveillance required.   Author: Tully FORBES Carwin, MD 02/03/2024 10:55 PM  For on call review [email or web address].

## 2024-02-03 NOTE — ED Notes (Signed)
 Messaged hospitalist for pain med

## 2024-02-03 NOTE — ED Notes (Signed)
 Family states that they do not want patient to have surgery here at [facility]; they would like her to have surgery in [facility] where her prior surgeries have been. Hospitalist notified of same

## 2024-02-03 NOTE — ED Notes (Signed)
 Pt called put requesting pain med; no meds ordered at this time.

## 2024-02-03 NOTE — ED Triage Notes (Signed)
 POV/ fall this afternoon on hardwood floor/ left hip deformity and shortening of left leg/ pt states she lost her balance/ pt is A&OX4

## 2024-02-03 NOTE — Assessment & Plan Note (Addendum)
 Stable. - Resume home regimen - Nebs as needed

## 2024-02-03 NOTE — Assessment & Plan Note (Addendum)
 Significant leukocytosis of 31.5,with increased absolute neutrophil count.  She is on chronic steroids 10 mg daily. Afebrile. Vitals stable.  At this time, she has no symptoms referable to infectious etiology.  Chest x-ray with increased interstitial markings bilaterally-interstitial lung disease, pulmonary edema or atypical pneumonia. -Considering degree of leukocytosis, which I cannot fully attribute to steroids at this time, will start CAP antibiotics empirically, ceftriaxone and azithromycin - Low threshold to discontinue antibiotics -Check procalcitonin - Follow-up UA - ??  Will need to confirm indication for chronic steroids

## 2024-02-03 NOTE — ED Provider Notes (Signed)
 [place] EMERGENCY DEPARTMENT AT [facility] Provider Note   CSN: [id number] Arrival date & time: 02/03/24  1708     Patient presents with: Hip Injury   [name] is a 78 y.o. female.   HPI   This patient is a 78 year old female, she has a medical history significant for anxiety on Xanax  twice a day, hypertension on amlodipine, she also has a history of hypothyroidism, she has had several falls over the last month or so but today while she was watching her 32-year-old grandchild she had a fall onto her left hip.  She has since that time not been able to walk and has noticed that her leg has been short and in severe pain.  Eventually the husband came to pick her up and brought her by car, she required assistance getting out of the car secondary to a likely hip fracture.  She denies any other injuries and has not been feeling poorly.  Prior to Admission medications   Medication Sig Start Date End Date Taking? Authorizing Provider  albuterol  (VENTOLIN  HFA) 108 (90 Base) MCG/ACT inhaler Inhale 2 puffs into the lungs every 6 (six) hours as needed for wheezing or shortness of breath.    [provider]  ALPRAZolam  (XANAX ) 1 MG tablet Take 1 mg by mouth 2 (two) times daily.    [provider]  amLODipine (NORVASC) 2.5 MG tablet Take 2.5 mg by mouth daily. 11/24/22   [provider]  Ascorbic Acid (VITAMIN C) 1000 MG tablet Take 1,000 mg by mouth daily.    [provider]  b complex vitamins tablet Take 1 tablet by mouth daily.    [provider]  Cholecalciferol (DIALYVITE VITAMIN D 5000) 125 MCG (5000 UT) capsule Take 5,000 Units by mouth daily.    [provider]  famotidine  (PEPCID ) 40 MG tablet Take 40 mg by mouth 2 (two) times daily. 12/25/22   [provider]  folic acid (FOLVITE) 1 MG tablet Take 1 mg by mouth daily. 11/05/16   [provider]  gabapentin  (NEURONTIN ) 400 MG capsule Take 400-800 mg by mouth  See admin instructions. Take 1 capsule (400 mg) by mouth in the morning & take 2 capsules (800 mg) by mouth at night.    [provider]  ipratropium-albuterol  (DUONEB) 0.5-2.5 (3) MG/3ML SOLN Take 3 mLs by nebulization every 4 (four) hours as needed (shortness of breath).    [provider]  levothyroxine  (SYNTHROID ) 25 MCG tablet Take 25 mcg by mouth every morning. 06/16/21   [provider]  Omega-3 Fatty Acids (FISH OIL) 1000 MG CAPS Take 1,000 mg by mouth 3 (three) times daily.     [provider]  sertraline  (ZOLOFT ) 50 MG tablet Take 50 mg by mouth daily.    [provider]  Vitamin A 2400 MCG (8000 UT) CAPS Take 2,400 mcg by mouth daily.    [provider]  vitamin B-12 (CYANOCOBALAMIN) 50 MCG tablet Take 50 mcg by mouth daily.    [provider]  VITAMIN E PO Take 1 capsule by mouth daily.    [provider]    Allergies: Patient has no known allergies.    Review of Systems  All other systems reviewed and are negative.   Updated Vital Signs BP (!) 180/74 (BP Location: Left Arm)   Pulse 81   Temp 98.3 F (36.8 C) (Oral)   Resp (!) 27   Wt 53 kg   SpO2 91%  BMI 21.37 kg/m   Physical Exam Vitals and nursing note reviewed.  Constitutional:      General: She is not in acute distress.    Appearance: She is well-developed.  HENT:     Head: Normocephalic and atraumatic.     Mouth/Throat:     Pharynx: No oropharyngeal exudate.  Eyes:     General: No scleral icterus.       Right eye: No discharge.        Left eye: No discharge.     Conjunctiva/sclera: Conjunctivae normal.     Pupils: Pupils are equal, round, and reactive to light.  Neck:     Thyroid : No thyromegaly.     Vascular: No JVD.  Cardiovascular:     Rate and Rhythm: Normal rate and regular rhythm.     Heart sounds: Normal heart sounds. No murmur heard.    No friction rub. No gallop.  Pulmonary:     Effort: Pulmonary effort is normal. No  respiratory distress.     Breath sounds: Normal breath sounds. No wheezing or rales.  Abdominal:     General: Bowel sounds are normal. There is no distension.     Palpations: Abdomen is soft. There is no mass.     Tenderness: There is no abdominal tenderness.  Musculoskeletal:        General: Tenderness, deformity and signs of injury present. Normal range of motion.     Cervical back: Normal range of motion and neck supple.     Right lower leg: No edema.     Left lower leg: No edema.     Comments: Left lower extremity is shortened and externally rotated  Lymphadenopathy:     Cervical: No cervical adenopathy.  Skin:    General: Skin is warm and dry.     Findings: No erythema or rash.  Neurological:     Mental Status: She is alert.     Coordination: Coordination normal.     Comments: Awake alert and able to follow commands limited only by pain in the left leg  Psychiatric:        Behavior: Behavior normal.     (all labs ordered are listed, but only abnormal results are displayed) Labs Reviewed  CBC WITH DIFFERENTIAL/PLATELET - Abnormal; Notable for the following components:      Result Value   WBC 31.5 (*)    RBC 3.64 (*)    MCV 104.4 (*)    MCH 34.3 (*)    All other components within normal limits  BASIC METABOLIC PANEL WITH GFR  PROTIME-INR  TYPE AND SCREEN    EKG: EKG Interpretation Date/Time:  Thursday February 03 2024 17:19:18 EDT Ventricular Rate:  83 PR Interval:  155 QRS Duration:  91 QT Interval:  363 QTC Calculation: 427 R Axis:   22  Text Interpretation: Sinus rhythm Borderline T wave abnormalities Confirmed by Cleotilde Rogue ([id number]) on 02/03/2024 5:26:06 PM  Radiology: [facility] Hip Unilat With Pelvis 2-3 Views Left Result Date: 02/03/2024 CLINICAL DATA:  trauma, fall, short leg. EXAM: DG HIP (WITH OR WITHOUT PELVIS) 2-3V LEFT COMPARISON:  None Available. FINDINGS: Pelvis is intact with normal and symmetric sacroiliac joints. There is mildly displaced and impacted  subcapital fracture of left femoral neck. No other acute fracture or dislocation. No aggressive osseous lesion. Visualized sacral arcuate lines are unremarkable. Unremarkable symphysis pubis. There are mild degenerative changes of bilateral hip joints without significant joint space narrowing. Osteophytosis of the superior acetabulum. No radiopaque foreign bodies.  IMPRESSION: *Mildly displaced and impacted subcapital fracture of left femoral neck. Electronically Signed   By: Ree Molt M.D.   On: 02/03/2024 18:07   DG Chest Port 1 View Result Date: 02/03/2024 CLINICAL DATA:  trauma, fall, short leg. EXAM: PORTABLE CHEST 1 VIEW COMPARISON:  04/04/2009. FINDINGS: Note is made of elevated left hemidiaphragm. Low lung volume. There are mildly increased interstitial markings throughout bilateral lungs, which is nonspecific. Differential diagnosis includes interstitial lung disease, mild pulmonary edema, atypical pneumonia, etc. No dense consolidation or lung collapse. Bilateral lateral costophrenic angles are clear. Normal cardio-mediastinal silhouette. There is a left sided 2-lead pacemaker. No acute osseous abnormalities. The soft tissues are within normal limits. There are surgical clips in the right upper quadrant, typical of a previous cholecystectomy. IMPRESSION: *Mildly increased interstitial markings throughout bilateral lungs, which is nonspecific. Differential diagnosis includes interstitial lung disease, mild pulmonary edema, atypical pneumonia, etc. No dense consolidation or lung collapse. Electronically Signed   By: Ree Molt M.D.   On: 02/03/2024 18:06     Procedures   Medications Ordered in the ED  fentaNYL  (SUBLIMAZE ) injection 50 mcg (50 mcg Intravenous Given 02/03/24 1724)  ondansetron  (ZOFRAN ) injection 4 mg (4 mg Intravenous Given 02/03/24 1728)                                    Medical Decision Making Amount and/or Complexity of Data Reviewed Labs: ordered. Radiology:  ordered.  Risk Prescription drug management. Decision regarding hospitalization.   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  Not anticoagulated   This patient presents to the ED for concern of injury to the left hip, this involves an extensive number of treatment options, and is a complaint that carries with it a high risk of complications and morbidity.  The differential diagnosis includes fracture, dislocation, contusion   Co morbidities / Chronic conditions that complicate the patient evaluation  Hypertension, elderly   Additional history obtained:  Additional history obtained from EMR External records from outside source obtained and reviewed including medical record, patient has known sick sinus syndrome and followed by cardiology.   Lab Tests:  I Ordered, and personally interpreted labs.  The pertinent results include: Severe leukocytosis of 31,000, new compared to prior labs which were several years ago, no anemia, normal platelets   Imaging Studies ordered:  I ordered imaging studies including x-ray of the hip shows a subcapital fracture I independently visualized and interpreted imaging which showed femoral neck fracture I agree with the radiologist interpretation   Cardiac Monitoring: / EKG:  The patient was maintained on a cardiac monitor.  I personally viewed and interpreted the cardiac monitored which showed an underlying rhythm of: Normal sinus rhythm   Problem List / ED Course / Critical interventions / Medication management  The patient was given fentanyl  twice for the pain, she has a displaced and impacted subcapital fracture of the left hip, no other significant injuries, blood pressure is elevated, labs are abnormal and will need further medical workup as well as surgical fixation of her hip I ordered medication including fentanyl  twice intravenously Reevaluation of the patient after these medicines showed that the patient  some improvement I have reviewed the patients home medicines and have made adjustments as needed   Consultations Obtained:  I requested consultation with the orthopedist Dr. Margrette who wants the patient to be admitted and will likely do surgery tomorrow if there  is OR time available, n.p.o. after midnight, will page hospitalist for admission,  and discussed lab and imaging findings as well as pertinent plan - they recommend: Admission to the hospital   Social Determinants of Health:  No prior surgical history   Test / Admission - Considered:  Mid to hospital      Final diagnoses:  Closed fracture of left hip, initial encounter (HCC)  Leukocytosis, unspecified type    ED Discharge Orders     None          Cleotilde Rogue, MD 02/03/24 1921

## 2024-02-03 NOTE — ED Notes (Signed)
 Hospitalist in room.  Gave pt sandwich and drink

## 2024-02-04 ENCOUNTER — Inpatient Hospital Stay (HOSPITAL_COMMUNITY): Admitting: Anesthesiology

## 2024-02-04 ENCOUNTER — Other Ambulatory Visit: Payer: Self-pay

## 2024-02-04 ENCOUNTER — Inpatient Hospital Stay (HOSPITAL_COMMUNITY)

## 2024-02-04 ENCOUNTER — Encounter (HOSPITAL_COMMUNITY)
Admission: EM | Disposition: A | Discharge status: Home Health | Payer: Self-pay | Source: Home / Self Care | Attending: Family Medicine

## 2024-02-04 ENCOUNTER — Encounter (HOSPITAL_COMMUNITY): Payer: Self-pay | Admitting: Internal Medicine

## 2024-02-04 DIAGNOSIS — S72001A Fracture of unspecified part of neck of right femur, initial encounter for closed fracture: Secondary | ICD-10-CM

## 2024-02-04 DIAGNOSIS — S72002A Fracture of unspecified part of neck of left femur, initial encounter for closed fracture: Secondary | ICD-10-CM | POA: Diagnosis not present

## 2024-02-04 DIAGNOSIS — I10 Essential (primary) hypertension: Secondary | ICD-10-CM | POA: Diagnosis not present

## 2024-02-04 DIAGNOSIS — Z87891 Personal history of nicotine dependence: Secondary | ICD-10-CM | POA: Diagnosis not present

## 2024-02-04 DIAGNOSIS — J449 Chronic obstructive pulmonary disease, unspecified: Secondary | ICD-10-CM | POA: Diagnosis not present

## 2024-02-04 HISTORY — PX: HIP ARTHROPLASTY: SHX981

## 2024-02-04 LAB — BASIC METABOLIC PANEL WITH GFR
Anion gap: 10 (ref 5–15)
BUN: 23 mg/dL (ref 8–23)
CO2: 28 mmol/L (ref 22–32)
Calcium: 8.4 mg/dL — ABNORMAL LOW (ref 8.9–10.3)
Chloride: 100 mmol/L (ref 98–111)
Creatinine, Ser: 0.97 mg/dL (ref 0.44–1.00)
GFR, Estimated: 60 mL/min — ABNORMAL LOW (ref >60)
Glucose, Bld: 113 mg/dL — ABNORMAL HIGH (ref 70–99)
Potassium: 4.1 mmol/L (ref 3.5–5.1)
Sodium: 138 mmol/L (ref 135–145)

## 2024-02-04 LAB — URINALYSIS, ROUTINE W REFLEX MICROSCOPIC
Bilirubin Urine: NEGATIVE
Glucose, UA: NEGATIVE mg/dL
Hgb urine dipstick: NEGATIVE
Ketones, ur: NEGATIVE mg/dL
Nitrite: NEGATIVE
Protein, ur: 30 mg/dL — AB
Specific Gravity, Urine: 1.02 (ref 1.005–1.030)
pH: 5 (ref 5.0–8.0)

## 2024-02-04 LAB — CBC
HCT: 34.8 % — ABNORMAL LOW (ref 36.0–46.0)
Hemoglobin: 11.2 g/dL — ABNORMAL LOW (ref 12.0–15.0)
MCH: 33.7 pg (ref 26.0–34.0)
MCHC: 32.2 g/dL (ref 30.0–36.0)
MCV: 104.8 fL — ABNORMAL HIGH (ref 80.0–100.0)
Platelets: 249 K/uL (ref 150–400)
RBC: 3.32 MIL/uL — ABNORMAL LOW (ref 3.87–5.11)
RDW: 14.7 % (ref 11.5–15.5)
WBC: 19 K/uL — ABNORMAL HIGH (ref 4.0–10.5)
nRBC: 0 % (ref 0.0–0.2)

## 2024-02-04 LAB — PROCALCITONIN: Procalcitonin: <0.1 ng/mL

## 2024-02-04 SURGERY — HEMIARTHROPLASTY (BIPOLAR) HIP, POSTERIOR APPROACH FOR FRACTURE
Anesthesia: General | Site: Hip | Laterality: Left

## 2024-02-04 MED ORDER — CEFAZOLIN SODIUM-DEXTROSE 2-4 GM/100ML-% IV SOLN
2.0000 g | INTRAVENOUS | Status: AC
  Administered 2024-02-04: 2 g via INTRAVENOUS
  Filled 2024-02-04: qty 100

## 2024-02-04 MED ORDER — OXYCODONE HCL 5 MG PO TABS: 5.0000 mg | ORAL_TABLET | Freq: Once | ORAL | Status: DC | PRN

## 2024-02-04 MED ORDER — ONDANSETRON HCL 4 MG/2ML IJ SOLN: 4.0000 mg | Freq: Once | INTRAMUSCULAR | Status: DC | PRN

## 2024-02-04 MED ORDER — MORPHINE SULFATE (PF) 2 MG/ML IV SOLN: 0.5000 mg | INTRAVENOUS | Status: DC | PRN

## 2024-02-04 MED ORDER — LIDOCAINE 2% (20 MG/ML) 5 ML SYRINGE
INTRAMUSCULAR | Status: DC | PRN
Start: 2024-02-04 — End: 2024-02-04
  Administered 2024-02-04: 60 mg via INTRAVENOUS

## 2024-02-04 MED ORDER — MENTHOL 3 MG MT LOZG: 1.0000 | LOZENGE | OROMUCOSAL | Status: DC | PRN

## 2024-02-04 MED ORDER — LACTATED RINGERS IV SOLN
INTRAVENOUS | Status: DC | PRN
Start: 2024-02-04 — End: 2024-02-04

## 2024-02-04 MED ORDER — STERILE WATER FOR IRRIGATION IR SOLN
Status: DC | PRN
  Administered 2024-02-04: 1000 mL

## 2024-02-04 MED ORDER — PHENYLEPHRINE 80 MCG/ML (10ML) SYRINGE FOR IV PUSH (FOR BLOOD PRESSURE SUPPORT)
PREFILLED_SYRINGE | INTRAVENOUS | Status: DC | PRN
  Administered 2024-02-04 (×5): 160 ug via INTRAVENOUS

## 2024-02-04 MED ORDER — SODIUM CHLORIDE 0.9 % IV SOLN: INTRAVENOUS | Status: AC

## 2024-02-04 MED ORDER — FENTANYL CITRATE (PF) 100 MCG/2ML IJ SOLN
INTRAMUSCULAR | Status: AC
  Filled 2024-02-04: qty 2

## 2024-02-04 MED ORDER — HYDROCODONE-ACETAMINOPHEN 7.5-325 MG PO TABS: 1.0000 | ORAL_TABLET | ORAL | Status: DC | PRN

## 2024-02-04 MED ORDER — CHLORHEXIDINE GLUCONATE 0.12 % MT SOLN: 15.0000 mL | Freq: Once | OROMUCOSAL | Status: DC

## 2024-02-04 MED ORDER — METHOCARBAMOL 500 MG PO TABS
500.0000 mg | ORAL_TABLET | Freq: Four times a day (QID) | ORAL | Status: DC | PRN
  Administered 2024-02-05: 500 mg via ORAL
  Filled 2024-02-04: qty 1

## 2024-02-04 MED ORDER — OXYCODONE HCL 5 MG/5ML PO SOLN: 5.0000 mg | Freq: Once | ORAL | Status: DC | PRN

## 2024-02-04 MED ORDER — METHOCARBAMOL 1000 MG/10ML IJ SOLN: 500.0000 mg | Freq: Four times a day (QID) | INTRAMUSCULAR | Status: DC | PRN

## 2024-02-04 MED ORDER — HYDROCODONE-ACETAMINOPHEN 5-325 MG PO TABS
1.0000 | ORAL_TABLET | ORAL | Status: DC | PRN
  Administered 2024-02-05 – 2024-02-06 (×4): 1 via ORAL
  Administered 2024-02-06: 2 via ORAL
  Administered 2024-02-07: 1 via ORAL
  Administered 2024-02-07 – 2024-02-08 (×2): 2 via ORAL
  Filled 2024-02-04: qty 1
  Filled 2024-02-04: qty 2
  Filled 2024-02-04: qty 1
  Filled 2024-02-04: qty 2
  Filled 2024-02-04: qty 1
  Filled 2024-02-04: qty 2
  Filled 2024-02-04: qty 1
  Filled 2024-02-04: qty 2

## 2024-02-04 MED ORDER — TRANEXAMIC ACID-NACL 1000-0.7 MG/100ML-% IV SOLN
1000.0000 mg | Freq: Once | INTRAVENOUS | Status: AC
  Administered 2024-02-04: 1000 mg via INTRAVENOUS
  Filled 2024-02-04: qty 100

## 2024-02-04 MED ORDER — SODIUM CHLORIDE 0.9 % IR SOLN
Status: DC | PRN
  Administered 2024-02-04: 1000 mL
  Administered 2024-02-04: 3000 mL

## 2024-02-04 MED ORDER — MIDAZOLAM HCL 2 MG/2ML IJ SOLN
INTRAMUSCULAR | Status: AC
  Filled 2024-02-04: qty 2

## 2024-02-04 MED ORDER — PROPOFOL 10 MG/ML IV BOLUS
INTRAVENOUS | Status: AC
  Filled 2024-02-04: qty 20

## 2024-02-04 MED ORDER — ORAL CARE MOUTH RINSE: 15.0000 mL | Freq: Once | OROMUCOSAL | Status: DC

## 2024-02-04 MED ORDER — POVIDONE-IODINE 10 % EX SWAB: 2.0000 | Freq: Once | CUTANEOUS | Status: DC

## 2024-02-04 MED ORDER — BUPIVACAINE-EPINEPHRINE (PF) 0.5% -1:200000 IJ SOLN
INTRAMUSCULAR | Status: DC | PRN
Start: 2024-02-04 — End: 2024-02-04
  Administered 2024-02-04: 30 mL via PERINEURAL

## 2024-02-04 MED ORDER — METOCLOPRAMIDE HCL 5 MG/ML IJ SOLN: 5.0000 mg | Freq: Three times a day (TID) | INTRAMUSCULAR | Status: DC | PRN

## 2024-02-04 MED ORDER — TRANEXAMIC ACID-NACL 1000-0.7 MG/100ML-% IV SOLN
1000.0000 mg | INTRAVENOUS | Status: AC
  Administered 2024-02-04: 1000 mg via INTRAVENOUS

## 2024-02-04 MED ORDER — ACETAMINOPHEN 325 MG PO TABS: 325.0000 mg | ORAL_TABLET | Freq: Four times a day (QID) | ORAL | Status: DC | PRN

## 2024-02-04 MED ORDER — MIDAZOLAM HCL 2 MG/2ML IJ SOLN
INTRAMUSCULAR | Status: DC | PRN
  Administered 2024-02-04 (×2): 1 mg via INTRAVENOUS

## 2024-02-04 MED ORDER — SUGAMMADEX SODIUM 200 MG/2ML IV SOLN
INTRAVENOUS | Status: DC | PRN
  Administered 2024-02-04: 200 mg via INTRAVENOUS

## 2024-02-04 MED ORDER — FENTANYL CITRATE (PF) 100 MCG/2ML IJ SOLN
INTRAMUSCULAR | Status: DC | PRN
  Administered 2024-02-04: 100 ug via INTRAVENOUS
  Administered 2024-02-04 (×2): 50 ug via INTRAVENOUS

## 2024-02-04 MED ORDER — ENOXAPARIN SODIUM 40 MG/0.4ML IJ SOSY
40.0000 mg | PREFILLED_SYRINGE | INTRAMUSCULAR | Status: DC
  Administered 2024-02-05 – 2024-02-08 (×4): 40 mg via SUBCUTANEOUS
  Filled 2024-02-04 (×4): qty 0.4

## 2024-02-04 MED ORDER — DOCUSATE SODIUM 100 MG PO CAPS
100.0000 mg | ORAL_CAPSULE | Freq: Two times a day (BID) | ORAL | Status: DC
  Administered 2024-02-04 – 2024-02-08 (×7): 100 mg via ORAL
  Filled 2024-02-04 (×7): qty 1

## 2024-02-04 MED ORDER — BUPIVACAINE-EPINEPHRINE (PF) 0.5% -1:200000 IJ SOLN
INTRAMUSCULAR | Status: AC
  Filled 2024-02-04: qty 30

## 2024-02-04 MED ORDER — FENTANYL CITRATE PF 50 MCG/ML IJ SOSY: 25.0000 ug | PREFILLED_SYRINGE | INTRAMUSCULAR | Status: DC | PRN

## 2024-02-04 MED ORDER — ROCURONIUM BROMIDE 10 MG/ML (PF) SYRINGE
PREFILLED_SYRINGE | INTRAVENOUS | Status: DC | PRN
Start: 2024-02-04 — End: 2024-02-04
  Administered 2024-02-04: 50 mg via INTRAVENOUS

## 2024-02-04 MED ORDER — DIPHENHYDRAMINE HCL 50 MG/ML IJ SOLN
25.0000 mg | Freq: Once | INTRAMUSCULAR | Status: AC
  Administered 2024-02-04: 25 mg via INTRAVENOUS

## 2024-02-04 MED ORDER — PHENYLEPHRINE HCL-NACL 20-0.9 MG/250ML-% IV SOLN
INTRAVENOUS | Status: DC | PRN
  Administered 2024-02-04: 30 ug/min via INTRAVENOUS

## 2024-02-04 MED ORDER — TRAMADOL HCL 50 MG PO TABS
50.0000 mg | ORAL_TABLET | Freq: Four times a day (QID) | ORAL | Status: DC
  Administered 2024-02-04 – 2024-02-06 (×6): 50 mg via ORAL
  Filled 2024-02-04 (×7): qty 1

## 2024-02-04 MED ORDER — LACTATED RINGERS IV SOLN: INTRAVENOUS | Status: DC

## 2024-02-04 MED ORDER — ACETAMINOPHEN 500 MG PO TABS
500.0000 mg | ORAL_TABLET | Freq: Four times a day (QID) | ORAL | Status: AC
  Administered 2024-02-04 – 2024-02-05 (×3): 500 mg via ORAL
  Filled 2024-02-04 (×4): qty 1

## 2024-02-04 MED ORDER — METOCLOPRAMIDE HCL 10 MG PO TABS: 5.0000 mg | ORAL_TABLET | Freq: Three times a day (TID) | ORAL | Status: DC | PRN

## 2024-02-04 MED ORDER — TRANEXAMIC ACID-NACL 1000-0.7 MG/100ML-% IV SOLN
INTRAVENOUS | Status: AC
  Filled 2024-02-04: qty 100

## 2024-02-04 MED ORDER — DIPHENHYDRAMINE HCL 50 MG/ML IJ SOLN
INTRAMUSCULAR | Status: AC
  Filled 2024-02-04: qty 1

## 2024-02-04 MED ORDER — CEFAZOLIN SODIUM-DEXTROSE 2-4 GM/100ML-% IV SOLN
2.0000 g | Freq: Four times a day (QID) | INTRAVENOUS | Status: AC
  Administered 2024-02-04 – 2024-02-05 (×2): 2 g via INTRAVENOUS
  Filled 2024-02-04 (×2): qty 100

## 2024-02-04 MED ORDER — PHENOL 1.4 % MT LIQD: 1.0000 | OROMUCOSAL | Status: DC | PRN

## 2024-02-04 MED ORDER — CHLORHEXIDINE GLUCONATE 4 % EX SOLN: 60.0000 mL | Freq: Once | CUTANEOUS | Status: DC

## 2024-02-04 MED ORDER — MORPHINE SULFATE (PF) 2 MG/ML IV SOLN
2.0000 mg | INTRAVENOUS | Status: DC | PRN
  Administered 2024-02-04 (×2): 2 mg via INTRAVENOUS
  Filled 2024-02-04 (×2): qty 1

## 2024-02-04 SURGICAL SUPPLY — 52 items
BALL HIP ARTICU 28 +5 (Hips) IMPLANT
BIT DRILL 2.8X128 (BIT) ×1 IMPLANT
BLADE SAGITTAL 25.0X1.27X90 (BLADE) ×1 IMPLANT
CHLORAPREP W/TINT 26 (MISCELLANEOUS) ×1 IMPLANT
CLOTH BEACON ORANGE TIMEOUT ST (SAFETY) ×1 IMPLANT
COUNTER NDL MAGNETIC 40 RED (SET/KITS/TRAYS/PACK) ×1 IMPLANT
COUNTER NEEDLE MAGNETIC 40 RED (SET/KITS/TRAYS/PACK) ×1 IMPLANT
COVER LIGHT HANDLE (MISCELLANEOUS) IMPLANT
COVER LIGHT HANDLE STERIS (MISCELLANEOUS) ×2 IMPLANT
DECANTER SPIKE VIAL GLASS SM (MISCELLANEOUS) ×1 IMPLANT
DRAPE HIP W/POCKET STRL (MISCELLANEOUS) ×1 IMPLANT
DRAPE U-SHAPE 47X51 STRL (DRAPES) ×1 IMPLANT
DRESSING AQUACEL AG ADV 3.5X12 (MISCELLANEOUS) ×1 IMPLANT
DRSG AQUACEL AG ADV 3.5X10 (GAUZE/BANDAGES/DRESSINGS) IMPLANT
DRSG MEPILEX SACRM 8.7X9.8 (GAUZE/BANDAGES/DRESSINGS) ×1 IMPLANT
ELECTRODE REM PT RTRN 9FT ADLT (ELECTROSURGICAL) ×1 IMPLANT
GLOVE BIOGEL PI IND STRL 7.0 (GLOVE) ×2 IMPLANT
GLOVE BIOGEL PI IND STRL 8.5 (GLOVE) ×1 IMPLANT
GLOVE SKINSENSE STRL SZ8.0 LF (GLOVE) ×2 IMPLANT
GOWN STRL REUS W/TWL LRG LVL3 (GOWN DISPOSABLE) ×2 IMPLANT
GOWN STRL REUS W/TWL XL LVL3 (GOWN DISPOSABLE) ×1 IMPLANT
HEAD BIPOLAR PROS AML 46 (Hips) IMPLANT
INST SET MAJOR BONE (KITS) ×1 IMPLANT
IV LR IRRIG 3000ML ARTHROMATIC (IV SOLUTION) IMPLANT
KIT TURNOVER KIT A (KITS) ×1 IMPLANT
MANIFOLD NEPTUNE II (INSTRUMENTS) ×1 IMPLANT
MARKER SKIN DUAL TIP RULER LAB (MISCELLANEOUS) ×1 IMPLANT
NDL HYPO 18GX1.5 BLUNT FILL (NEEDLE) ×1 IMPLANT
NDL HYPO 21X1.5 SAFETY (NEEDLE) ×1 IMPLANT
NDL MAYO 6 CRC TAPER PT (NEEDLE) IMPLANT
NEEDLE HYPO 18GX1.5 BLUNT FILL (NEEDLE) ×1 IMPLANT
NEEDLE HYPO 21X1.5 SAFETY (NEEDLE) ×1 IMPLANT
NEEDLE MAYO 6 CRC TAPER PT (NEEDLE) ×1 IMPLANT
NS IRRIG 1000ML POUR BTL (IV SOLUTION) ×1 IMPLANT
PACK TOTAL JOINT (CUSTOM PROCEDURE TRAY) ×1 IMPLANT
PAD ARMBOARD POSITIONER FOAM (MISCELLANEOUS) ×1 IMPLANT
PIN STMN SNGL STERILE 9X3.6MM (PIN) ×2 IMPLANT
POSITIONER HEAD 8X9X4 ADT (SOFTGOODS) ×1 IMPLANT
SET BASIN LINEN APH (SET/KITS/TRAYS/PACK) ×1 IMPLANT
SET HNDPC FAN SPRY TIP SCT (DISPOSABLE) ×1 IMPLANT
STAPLER SKIN PROX WIDE 3.9 (STAPLE) ×1 IMPLANT
STEM FEM ACTIS STD SZ2 (Stem) IMPLANT
SUT BRALON NAB BRD #1 30IN (SUTURE) ×2 IMPLANT
SUT ETHIBOND 5 LR DA (SUTURE) ×2 IMPLANT
SUT MNCRL 0 VIOLET CTX 36 (SUTURE) ×1 IMPLANT
SUT MON AB 0 CT1 (SUTURE) ×1 IMPLANT
SUT VIC AB 1 CT1 27XBRD ANTBC (SUTURE) ×4 IMPLANT
SYR 30ML LL (SYRINGE) ×1 IMPLANT
SYR BULB IRRIG 60ML STRL (SYRINGE) ×1 IMPLANT
TRAY FOLEY MTR SLVR 16FR STAT (SET/KITS/TRAYS/PACK) ×1 IMPLANT
WATER STERILE IRR 1000ML POUR (IV SOLUTION) ×2 IMPLANT
YANKAUER SUCT 12FT TUBE ARGYLE (SUCTIONS) ×1 IMPLANT

## 2024-02-04 NOTE — Anesthesia Postprocedure Evaluation (Signed)
 Anesthesia Post Note  Patient: [name]  Procedure(s) Performed: HEMIARTHROPLASTY (BIPOLAR) HIP, POSTERIOR APPROACH FOR FRACTURE (Left: Hip)  Patient location during evaluation: Phase II Anesthesia Type: General Level of consciousness: awake Pain management: pain level controlled Vital Signs Assessment: post-procedure vital signs reviewed and stable Respiratory status: spontaneous breathing and respiratory function stable Cardiovascular status: blood pressure returned to baseline and stable Postop Assessment: no headache and no apparent nausea or vomiting Anesthetic complications: no Comments: Late entry   No notable events documented.   Last Vitals:  Vitals:   02/04/24 1530 02/04/24 1549  BP: (!) 157/59 (!) 155/72  Pulse: 65 72  Resp: 14   Temp: 37.3 C 37 C  SpO2: 100% 100%    Last Pain:  Vitals:   02/04/24 1724  TempSrc:   PainSc: 4                  Yvonna JINNY Bosworth

## 2024-02-04 NOTE — Anesthesia Preprocedure Evaluation (Signed)
 Anesthesia Evaluation  Patient identified by MRN, date of birth, ID band Patient awake    Reviewed: Allergy & Precautions, H&P , NPO status , Patient's Chart, lab work & pertinent test results, reviewed documented beta blocker date and time   Airway Mallampati: II  TM Distance: >3 FB Neck ROM: full    Dental no notable dental hx.    Pulmonary shortness of breath, COPD, former smoker   Pulmonary exam normal breath sounds clear to auscultation       Cardiovascular Exercise Tolerance: Good hypertension, + pacemaker  Rhythm:regular Rate:Normal     Neuro/Psych  PSYCHIATRIC DISORDERS  Depression    negative neurological ROS     GI/Hepatic Neg liver ROS, PUD,GERD  ,,  Endo/Other  negative endocrine ROS    Renal/GU negative Renal ROS  negative genitourinary   Musculoskeletal   Abdominal   Peds  Hematology  (+) Blood dyscrasia, anemia   Anesthesia Other Findings   Reproductive/Obstetrics negative OB ROS                              Anesthesia Physical Anesthesia Plan  ASA: 3  Anesthesia Plan: General and General ETT   Post-op Pain Management:    Induction:   PONV Risk Score and Plan: Ondansetron   Airway Management Planned:   Additional Equipment:   Intra-op Plan:   Post-operative Plan:   Informed Consent: I have reviewed the patients History and Physical, chart, labs and discussed the procedure including the risks, benefits and alternatives for the proposed anesthesia with the patient or authorized representative who has indicated his/her understanding and acceptance.     Dental Advisory Given  Plan Discussed with: CRNA  Anesthesia Plan Comments:         Anesthesia Quick Evaluation

## 2024-02-04 NOTE — Progress Notes (Signed)
 PROGRESS NOTE     [name], is a 78 y.o. female, DOB - 1946/04/10, FMW:[id number]  Admit date - 02/03/2024   Admitting Physician Ejiroghene FORBES Carwin, MD  Outpatient Primary MD for the patient is Orpha Yancey LABOR, MD  LOS - 1  Chief Complaint  Patient presents with   Hip Injury    left      Brief Narrative:  78 y.o. female with medical history significant for COPD, hypertension, tobacco abuse, SSS admitted on 02/03/2024 with left femoral neck fracture after mechanical fall at home (tripped over a toy)    -Assessment and Plan: 1)Left Displaced Femoral Neck Fracture  Status post mechanical fall. Mildly displaced and impacted subcapital fracture of left femoral neck. S/p ORIF by Dr. Margrette on 02/04/2024 -Postop plan  Weightbearing as tolerated Direct lateral hip precautions DVT prophylaxis for 30 days--- Lovenox  while in the hospital Remove staples at 12 to 14 days Postop appointment scheduled for 28 days --Tramadol  and Zanaflex  as ordered   2)Leukocytosis --- WBC 31.5 >>19.0 --??  Reactive in the setting of mechanical fall and hip fracture -Also patient is chronically on steroids which may perpetuate leukocytosis, no recent WBC available since 2021 to compare ???  Pneumonia PCT <0.10 - UA Not suggestive of UTI --Continue Rocephin /azithromycin  for now repeat chest x-ray in 1 to 2 days  3)COPD (chronic obstructive pulmonary disease) (HCC) -No acute exacerbation at this time - Continue prednisone  10 mg daily .-Continue bronchodilators  4)Hypothyroidism----stable continue atorvastatin  5)GERD--- stable, continue Pepcid   6)Depression/Anxiety--stable, continue Zoloft  and use Xanax   Status is: Inpatient   Disposition: The patient is from: Home              Anticipated d/c is to: SNF              Anticipated d/c date is: 1 day              Patient currently is not medically stable to d/c. Barriers: Not Clinically Stable-   Code Status :  -  Code Status: Full Code    Family Communication:    NA (patient is alert, awake and coherent)   DVT Prophylaxis  :   - SCDs  enoxaparin  (LOVENOX ) injection 40 mg Start: 02/05/24 1000 SCDs Start: 02/04/24 1610 Place TED hose Start: 02/04/24 1610 SCDs Start: 02/03/24 2254   Lab Results  Component Value Date   PLT 249 02/04/2024    Inpatient Medications  Scheduled Meds:  acetaminophen   500 mg Oral Q6H   ALPRAZolam   1 mg Oral BID   docusate sodium   100 mg Oral BID   [START ON 02/05/2024] enoxaparin  (LOVENOX ) injection  40 mg Subcutaneous Q24H   famotidine   40 mg Oral BID   fluticasone  furoate-vilanterol  1 puff Inhalation Daily   gabapentin   400 mg Oral Daily   gabapentin   800 mg Oral QHS   levothyroxine   25 mcg Oral Q0600   predniSONE   10 mg Oral Q breakfast   sertraline   50 mg Oral Daily   tiZANidine   2 mg Oral BID   traMADol   50 mg Oral Q6H   Continuous Infusions:  sodium chloride  75 mL/hr at 02/04/24 1620   azithromycin  Stopped (02/04/24 0757)    ceFAZolin  (ANCEF ) IV     cefTRIAXone  (ROCEPHIN )  IV Stopped (02/04/24 0557)   tranexamic acid      PRN Meds:.acetaminophen  **OR** acetaminophen , [START ON 02/05/2024] acetaminophen , HYDROcodone -acetaminophen , HYDROcodone -acetaminophen , ipratropium-albuterol , menthol -cetylpyridinium **OR** phenol, methocarbamol  **OR** methocarbamol  (ROBAXIN ) injection, metoCLOPramide  **OR** metoCLOPramide  (REGLAN ) injection,  morphine  injection, morphine  injection, ondansetron  **OR** ondansetron  (ZOFRAN ) IV, polyethylene glycol   Anti-infectives (From admission, onward)    Start     Dose/Rate Route Frequency Ordered Stop   02/04/24 1800  ceFAZolin  (ANCEF ) IVPB 2g/100 mL premix        2 g 200 mL/hr over 30 Minutes Intravenous Every 6 hours 02/04/24 1609 02/05/24 0559   02/04/24 1100  ceFAZolin  (ANCEF ) IVPB 2g/100 mL premix        2 g 200 mL/hr over 30 Minutes Intravenous On call to O.R. 02/04/24 1054 02/04/24 1232   02/03/24 2300  cefTRIAXone  (ROCEPHIN ) 2 g in sodium  chloride 0.9 % 100 mL IVPB        2 g 200 mL/hr over 30 Minutes Intravenous Every 24 hours 02/03/24 2254     02/03/24 2300  azithromycin  (ZITHROMAX ) 500 mg in sodium chloride  0.9 % 250 mL IVPB        500 mg 250 mL/hr over 60 Minutes Intravenous Every 24 hours 02/03/24 2254         Subjective: [name] today has no fevers, no emesis,  No chest pain,   - Pain control improving with as needed opiates   Objective: Vitals:   02/04/24 1500 02/04/24 1515 02/04/24 1530 02/04/24 1549  BP: (!) 168/74 (!) 118/107 (!) 157/59 (!) 155/72  Pulse: 71 71 65 72  Resp: 18 13 14    Temp:   99.2 F (37.3 C) 98.6 F (37 C)  TempSrc:    Oral  SpO2: 100% 100% 100% 100%  Weight:      Height:        Intake/Output Summary (Last 24 hours) at 02/04/2024 1638 Last data filed at 02/04/2024 1515 Gross per 24 hour  Intake 1828.16 ml  Output 800 ml  Net 1028.16 ml   Filed Weights   02/03/24 1713 02/04/24 0757  Weight: 53 kg 52.6 kg    Physical Exam  Gen:- Awake Alert,  in no apparent distress  HEENT:- Kelly.AT, No sclera icterus Neck-Supple Neck,No JVD,.  Lungs-  CTAB , fair symmetrical air movement CV- S1, S2 normal, regular  Abd-  +ve B.Sounds, Abd Soft, No tenderness,    Extremity/Skin:- No  edema, pedal pulses present  Psych-affect is appropriate, oriented x3 Neuro-no new focal deficits, no tremors MSK-left hip postoperative wound is clean dry and intact  Data Reviewed: I have personally reviewed following labs and imaging studies  CBC: Recent Labs  Lab 02/03/24 1731 02/04/24 0340  WBC 31.5* 19.0*  NEUTROABS 28.4*  --   HGB 12.5 11.2*  HCT 38.0 34.8*  MCV 104.4* 104.8*  PLT 328 249   Basic Metabolic Panel: Recent Labs  Lab 02/03/24 1731 02/04/24 0340  NA 136 138  K 3.4* 4.1  CL 95* 100  CO2 25 28  GLUCOSE 218* 113*  BUN 19 23  CREATININE 0.93 0.97  CALCIUM 9.0 8.4*   GFR: Estimated Creatinine Clearance: 37.8 mL/min (by C-G formula based on SCr of 0.97  mg/dL).  Radiology Studies: DG HIP UNILAT WITH PELVIS 2-3 VIEWS LEFT Result Date: 02/04/2024 CLINICAL DATA:  Status post hemiarthroplasty. EXAM: DG HIP (WITH OR WITHOUT PELVIS) 2-3V LEFT COMPARISON:  Preoperative imaging. FINDINGS: Left hip hemiarthroplasty in expected alignment. No periprosthetic lucency or fracture. Recent postsurgical change includes air and edema in the soft tissues. Overlying skin staples in place. IMPRESSION: Left hip hemiarthroplasty without immediate postoperative complication. Electronically Signed   By: Andrea Gasman M.D.   On: 02/04/2024 15:24   DG  Hip Unilat With Pelvis 2-3 Views Left Result Date: 02/03/2024 CLINICAL DATA:  trauma, fall, short leg. EXAM: DG HIP (WITH OR WITHOUT PELVIS) 2-3V LEFT COMPARISON:  None Available. FINDINGS: Pelvis is intact with normal and symmetric sacroiliac joints. There is mildly displaced and impacted subcapital fracture of left femoral neck. No other acute fracture or dislocation. No aggressive osseous lesion. Visualized sacral arcuate lines are unremarkable. Unremarkable symphysis pubis. There are mild degenerative changes of bilateral hip joints without significant joint space narrowing. Osteophytosis of the superior acetabulum. No radiopaque foreign bodies. IMPRESSION: *Mildly displaced and impacted subcapital fracture of left femoral neck. Electronically Signed   By: Ree Molt M.D.   On: 02/03/2024 18:07   DG Chest Port 1 View Result Date: 02/03/2024 CLINICAL DATA:  trauma, fall, short leg. EXAM: PORTABLE CHEST 1 VIEW COMPARISON:  04/04/2009. FINDINGS: Note is made of elevated left hemidiaphragm. Low lung volume. There are mildly increased interstitial markings throughout bilateral lungs, which is nonspecific. Differential diagnosis includes interstitial lung disease, mild pulmonary edema, atypical pneumonia, etc. No dense consolidation or lung collapse. Bilateral lateral costophrenic angles are clear. Normal cardio-mediastinal  silhouette. There is a left sided 2-lead pacemaker. No acute osseous abnormalities. The soft tissues are within normal limits. There are surgical clips in the right upper quadrant, typical of a previous cholecystectomy. IMPRESSION: *Mildly increased interstitial markings throughout bilateral lungs, which is nonspecific. Differential diagnosis includes interstitial lung disease, mild pulmonary edema, atypical pneumonia, etc. No dense consolidation or lung collapse. Electronically Signed   By: Ree Molt M.D.   On: 02/03/2024 18:06   Scheduled Meds:  acetaminophen   500 mg Oral Q6H   ALPRAZolam   1 mg Oral BID   docusate sodium   100 mg Oral BID   [START ON 02/05/2024] enoxaparin  (LOVENOX ) injection  40 mg Subcutaneous Q24H   famotidine   40 mg Oral BID   fluticasone  furoate-vilanterol  1 puff Inhalation Daily   gabapentin   400 mg Oral Daily   gabapentin   800 mg Oral QHS   levothyroxine   25 mcg Oral Q0600   predniSONE   10 mg Oral Q breakfast   sertraline   50 mg Oral Daily   tiZANidine   2 mg Oral BID   traMADol   50 mg Oral Q6H   Continuous Infusions:  sodium chloride  75 mL/hr at 02/04/24 1620   azithromycin  Stopped (02/04/24 0757)    ceFAZolin  (ANCEF ) IV     cefTRIAXone  (ROCEPHIN )  IV Stopped (02/04/24 0557)   tranexamic acid       LOS: 1 day   Rendall Carwin M.D on 02/04/2024 at 4:38 PM  Go to [email or web address] - for contact info  [facility] - Office  [phone]  If 7PM-7AM, please contact night-coverage [email or web address] 02/04/2024, 4:38 PM

## 2024-02-04 NOTE — Op Note (Signed)
 Orthopaedic Surgery Operative Note (CSN: [id number])  [name]  October 02, 1945 Date of Surgery: 02/04/2024   Diagnoses:  left femoral neck fracture  Procedure: Left bipolar replacement    TXA [used/not used] used    Operative Finding Complete fracture left femoral neck    Post-Op Diagnosis: Same Surgeons:Primary: Margrette Taft BRAVO, MD Location: [facility] OR ROOM 4 Estimated Blood Loss: 150 Complications: None Specimens: None   Implants: Implant Name Type Inv. Item Serial No. Manufacturer Lot No. LRB No. Used Action  BALL HIP ARTICU 28 +5 - [id number] Hips BALL HIP ARTICU 28 +5  [employer] [id number] Left 1 Implanted  STEM FEM ACTIS STD SZ2 - [id number] Stem STEM FEM ACTIS STD SZ2  [employer] [id number] Left 1 Implanted  HEAD BIPOLAR PROS AML 46 - [id number] Hips HEAD BIPOLAR PROS AML 46  [employer] [id number] Left 1 Implanted     Indication for surgery displaced left hip fracture  02/04/2024  1:58 PM  PATIENT:  [name]  78 y.o. female  PRE-OPERATIVE DIAGNOSIS:  left femoral neck fracture  POST-OPERATIVE DIAGNOSIS:  left femoral neck fracture  PROCEDURE:  Procedure(s): HEMIARTHROPLASTY (BIPOLAR) HIP, POSTERIOR APPROACH FOR FRACTURE (Left)  SURGEON:  Surgeons and Role:    DEWAINE Margrette Taft BRAVO, MD - Primary  PHYSICIAN ASSISTANT:   ASSISTANTS: Montie Seltzer   ANESTHESIA:   general  EBL:  300 mL   BLOOD ADMINISTERED:none  DRAINS: none   LOCAL MEDICATIONS USED:  MARCAINE      SPECIMEN:  No Specimen  DISPOSITION OF SPECIMEN:  N/A  COUNTS:  YES  TOURNIQUET:  * No tourniquets in log *  DICTATION: .Dragon Dictation  PLAN OF CARE: Admit to inpatient   PATIENT DISPOSITION:  PACU - hemodynamically stable.   Delay start of Pharmacological VTE agent (>24hrs) due to surgical blood loss or risk of bleeding: not applicable  Details of surgery: The patient was identified by 2 approved identification mechanisms. The operative extremity was  evaluated and found to be acceptable for surgical treatment today. The chart was reviewed. The surgical site was confirmed initials were placed Over the left hip  The patient was taken to the operating room and given Ancef  2 g this is consistent with the SCIP protocol.  The patient was given the following anesthetic: General  Foley catheter insertion was completed  The patient was then placed in the lateral decubitus position with appropriate padding. The surgical site was prepped and draped sterilely.  Timeout was executed confirming the patient's name, surgical site, antibiotic administration, x-rays available, and implants were checked and were available.  Incision was made over the greater trochanter extended proximally and distally approximately 3 cm  Subcutaneous tissue was divided down to fascia which was then split in line with the skin incision and deep retractors were placed.  The greater trochanteric bursa was resected exposing the abductors   The gluteus medius anterior half was subperiosteally dissected from the greater trochanter and tagged with #1 Vicryl sutures  The underlying gluteus minimus was split in continuity with the capsule and preserved tagging with Vicryl sutures as well    The femoral head was removed and measured 46  The acetabular was inspected: No acetabular erosion was noted  The hip was dislocated anteriorly into a sterile bag  Proximal femur was prepared starting with a femoral neck cutting guide, box osteotome, and further preparation per manufacture technique  Broaching was started with a size 1 and broached up to appropriate size based on proximal fit and fill.  This was a size 2  Trial reduction was then performed using a 2 stem 46 head +5 neck length  Trial reduction we found the hip to be stable in all positions  The trial implants were then removed 3 drill holes were placed in the femur and greater trochanter and two #5 Ethibond sutures  were passed   The acetabulum was irrigated and cleaned of any bony debris, the  implants were placed and the hip was reduced  The hip was stable throughout the range of motion.    Hip flexion  Hip extension with external rotation  Sleep position stress test  Shuck test  Leg lengths  Local anesthetic Marcaine  with epinephrine  was injected in the soft tissues around the hip joint, gluteus minimus was repaired with #1 Vicryl suture back to its anatomic position.  The abductors were then repaired with #1 Vicryl suture and two #5 Ethibond sutures.   The hip was then abducted the fascia was closed with #1 Braylon  Subcutaneous tissues were closed with 0 Monocryl in 2 layers followed by staples  A sterile dressing was applied  The patient was taken recovery room in stable condition   Postop plan  Weightbearing as tolerated Direct lateral hip precautions DVT prophylaxis for 30 days Remove staples at 12 to 14 days Postop appointment scheduled for 28 days  [id number]

## 2024-02-04 NOTE — Anesthesia Procedure Notes (Signed)
 Procedure Name: Intubation Date/Time: 02/04/2024 12:26 PM  Performed by: Cordella Elvie HERO, CRNAPre-anesthesia Checklist: Patient identified, Emergency Drugs available, Suction available, Patient being monitored and Timeout performed Patient Re-evaluated:Patient Re-evaluated prior to induction Oxygen  Delivery Method: Circle system utilized Preoxygenation: Pre-oxygenation with 100% oxygen  Induction Type: IV induction Laryngoscope Size: Mac and 3 Grade View: Grade I Tube type: Oral Tube size: 7.0 mm Number of attempts: 1 Airway Equipment and Method: Stylet Placement Confirmation: ETT inserted through vocal cords under direct vision, CO2 detector, positive ETCO2 and breath sounds checked- equal and bilateral Secured at: 22 cm Tube secured with: Tape Dental Injury: Teeth and Oropharynx as per pre-operative assessment

## 2024-02-04 NOTE — Transfer of Care (Signed)
 Immediate Anesthesia Transfer of Care Note  Patient: [name]  Procedure(s) Performed: HEMIARTHROPLASTY (BIPOLAR) HIP, POSTERIOR APPROACH FOR FRACTURE (Left: Hip)  Patient Location: PACU  Anesthesia Type:General  Level of Consciousness: awake, alert , and patient cooperative  Airway & Oxygen  Therapy: Patient Spontanous Breathing and Patient connected to face mask oxygen   Post-op Assessment: Report given to RN, Post -op Vital signs reviewed and stable, and Patient moving all extremities X 4  Post vital signs: Reviewed and stable  Last Vitals:  Vitals Value Taken Time  BP 131/86 02/04/24 14:16  Temp 37.1 C 02/04/24 14:05  Pulse 92 02/04/24 14:23  Resp 15 02/04/24 14:23  SpO2 100 % 02/04/24 14:23  Vitals shown include unfiled device data.  Last Pain:  Vitals:   02/04/24 1105  TempSrc: (P) Oral  PainSc: (P) 4       Patients Stated Pain Goal: (P) 8 (02/04/24 1105)  Complications: No notable events documented.

## 2024-02-04 NOTE — Brief Op Note (Signed)
 02/04/2024  1:58 PM  PATIENT:  [name]  78 y.o. female  PRE-OPERATIVE DIAGNOSIS:  left femoral neck fracture  POST-OPERATIVE DIAGNOSIS:  left femoral neck fracture  PROCEDURE:  Procedure(s): HEMIARTHROPLASTY (BIPOLAR) HIP, POSTERIOR APPROACH FOR FRACTURE (Left)  SURGEON:  Surgeons and Role:    * Margrette Taft BRAVO, MD - Primary  PHYSICIAN ASSISTANT:   ASSISTANTS: Montie Seltzer   ANESTHESIA:   general  EBL:  300 mL   BLOOD ADMINISTERED:none  DRAINS: none   LOCAL MEDICATIONS USED:  MARCAINE      SPECIMEN:  No Specimen  DISPOSITION OF SPECIMEN:  N/A  COUNTS:  YES  TOURNIQUET:  * No tourniquets in log *  DICTATION: .Dragon Dictation  PLAN OF CARE: Admit to inpatient   PATIENT DISPOSITION:  PACU - hemodynamically stable.   Delay start of Pharmacological VTE agent (>24hrs) due to surgical blood loss or risk of bleeding: not applicable

## 2024-02-04 NOTE — Consult Note (Signed)
 ORTHOPAEDIC CONSULTATION  REQUESTING PHYSICIAN: Pearlean Manus, MD  ASSESSMENT AND PLAN: 78 y.o. female who has some significant respiratory issues with COPD fell and fractured her left hip.  The patient's daughter-in-law says she is very active but has some serious medical conditions.  Specific risks for her include postop pneumonia, postop respiratory failure.  But, in the setting of a hip fracture is best to proceed within 48 hours preferably within 24 hours with definitive surgical management which will include a left bipolar replacement.  She would classify as a moderately functioning patient over 4 years old for which I have chosen bipolar replacement.  Note she also has a pacemaker for sick sinus syndrome  The procedure has been fully reviewed with the patient; The risks and benefits of surgery have been discussed and explained and understood. Alternative treatment has also been reviewed, questions were encouraged and answered. The postoperative plan is also been reviewed.  Chief Complaint: Left hip pain  HPI: [name] is a 78 y.o. female with painful left hip after fall.  Patient's daughter-in-law indicates she fell several times prior to this within the last 2 weeks.  She lost her balance yesterday on July 10 fractured her left hip.  She was unable to ambulate was brought to the hospital for workup.  X-rays show fracture of the left femoral neck.  She has severe pain in her left hip it is nonradiating and it prevents her from walking  Past Medical History:  Diagnosis Date   Chronic pain    COPD with emphysema (HCC)    Depression    GERD (gastroesophageal reflux disease)    Hypertension    Mitral and aortic regurgitation    Mild   Neurodermatitis    Peptic ulcer    Sick sinus syndrome (HCC)    s/p PPM (SJM)   Syncope    Past Surgical History:  Procedure Laterality Date   APPENDECTOMY     BIOPSY  05/12/2019   Procedure: BIOPSY;  Surgeon: Golda Claudis PENNER, MD;   Location: [facility];  Service: Endoscopy;;  antral, pre-pyloric mucosa   CARPAL TUNNEL RELEASE     CHOLECYSTECTOMY     COLONOSCOPY WITH PROPOFOL  N/A 03/03/2019   Procedure: COLONOSCOPY WITH PROPOFOL ;  Surgeon: Golda Claudis PENNER, MD;  Location: [facility];  Service: Endoscopy;  Laterality: N/A;  9:50   ESOPHAGEAL DILATION N/A 01/15/2017   Procedure: ESOPHAGEAL DILATION;  Surgeon: Golda Claudis PENNER, MD;  Location: [facility];  Service: Endoscopy;  Laterality: N/A;   ESOPHAGOGASTRODUODENOSCOPY (EGD) WITH PROPOFOL  N/A 01/15/2017   Procedure: ESOPHAGOGASTRODUODENOSCOPY (EGD) WITH PROPOFOL ;  Surgeon: Golda Claudis PENNER, MD;  Location: [facility];  Service: Endoscopy;  Laterality: N/A;  9:15   ESOPHAGOGASTRODUODENOSCOPY (EGD) WITH PROPOFOL  N/A 05/12/2019   Procedure: ESOPHAGOGASTRODUODENOSCOPY (EGD) WITH PROPOFOL ;  Surgeon: Golda Claudis PENNER, MD;  Location: [facility];  Service: Endoscopy;  Laterality: N/A;  855am   NISSEN FUNDOPLICATION     PACEMAKER INSERTION     [employer]   PPM GENERATOR CHANGEOUT N/A 04/09/2020   Procedure: PPM GENERATOR CHANGEOUT;  Surgeon: Kelsie Agent, MD;  Location: [facility];  Service: Cardiovascular;  Laterality: N/A;   SHOULDER SURGERY     SPLENECTOMY     Social History   Socioeconomic History   Marital status: Married    Spouse name: Not on file   Number of children: 1   Years of education: Not on file   Highest education level: Not on  file  Occupational History   Not on file  Tobacco Use   Smoking status: Former    Current packs/day: 0.00    Average packs/day: 2.0 packs/day for 48.8 years (97.5 ttl pk-yrs)    Types: Cigarettes    Start date: 11/13/1963    Quit date: 08/18/2012    Years since quitting: 11.4   Smokeless tobacco: Never   Tobacco comments:    I am pleased that she recently quit.  Vaping Use   Vaping status: Never Used  Substance and Sexual Activity   Alcohol use: No    Alcohol/week: 0.0 standard drinks of alcohol    Comment:  Former heavy drinker   Drug use: No    Comment: Former benodiazepine dependence   Sexual activity: Not on file  Other Topics Concern   Not on file  Social History Narrative   Not on file   Social Drivers of Health   Financial Resource Strain: Not on file  Food Insecurity: Not on file  Transportation Needs: Not on file  Physical Activity: Not on file  Stress: Not on file  Social Connections: Not on file   History reviewed. No pertinent family history. No Known Allergies Prior to Admission medications   Medication Sig Start Date End Date Taking? Authorizing Provider  albuterol  (VENTOLIN  HFA) 108 (90 Base) MCG/ACT inhaler Inhale 2 puffs into the lungs every 6 (six) hours as needed for wheezing or shortness of breath.   Yes [provider]  ALPRAZolam  (XANAX ) 1 MG tablet Take 1 mg by mouth 2 (two) times daily.   Yes [provider]  Ascorbic Acid (VITAMIN C) 1000 MG tablet Take 1,000 mg by mouth daily.   Yes [provider]  b complex vitamins tablet Take 1 tablet by mouth daily.   Yes [provider]  Calcium 200 MG TABS Take 1 tablet by mouth daily.   Yes [provider]  Cholecalciferol (DIALYVITE VITAMIN D 5000) 125 MCG (5000 UT) capsule Take 5,000 Units by mouth daily.   Yes [provider]  diphenhydrAMINE  (BENADRYL ) 25 mg capsule Take 25 mg by mouth every 6 (six) hours as needed for allergies.   Yes [provider]  famotidine  (PEPCID ) 40 MG tablet Take 40 mg by mouth 2 (two) times daily. 12/25/22  Yes [provider]  fluticasone -salmeterol (ADVAIR) 250-50 MCG/ACT AEPB Inhale 1 puff into the lungs 2 (two) times daily. 11/24/23  Yes [provider]  folic acid (FOLVITE) 1 MG tablet Take 1 mg by mouth daily. 11/05/16  Yes [provider]  gabapentin  (NEURONTIN ) 400 MG capsule Take 400-800 mg by mouth See admin instructions. Take 1 capsule (400 mg) by mouth in the morning & take 2 capsules (800 mg)  by mouth at night.   Yes [provider]  ipratropium-albuterol  (DUONEB) 0.5-2.5 (3) MG/3ML SOLN Take 3 mLs by nebulization every 4 (four) hours as needed (shortness of breath).   Yes [provider]  levothyroxine  (SYNTHROID ) 25 MCG tablet Take 25 mcg by mouth every morning. 06/16/21  Yes [provider]  Omega-3 Fatty Acids (FISH OIL) 1000 MG CAPS Take 1,000 mg by mouth 3 (three) times daily.    Yes [provider]  predniSONE  (DELTASONE ) 10 MG tablet Take 10 mg by mouth daily with breakfast.   Yes [provider]  sertraline  (ZOLOFT ) 50 MG tablet Take 50 mg by mouth daily.   Yes [provider]  tiZANidine  (ZANAFLEX ) 2 MG tablet Take 2 mg by mouth  2 (two) times daily. 01/03/24  Yes [provider]  Vitamin A 2400 MCG (8000 UT) CAPS Take 2,400 mcg by mouth daily.   Yes [provider]  vitamin B-12 (CYANOCOBALAMIN) 50 MCG tablet Take 50 mcg by mouth daily.   Yes [provider]  VITAMIN E PO Take 1 capsule by mouth daily.   Yes [provider]   DG Hip Unilat With Pelvis 2-3 Views Left Result Date: 02/03/2024 CLINICAL DATA:  trauma, fall, short leg. EXAM: DG HIP (WITH OR WITHOUT PELVIS) 2-3V LEFT COMPARISON:  None Available. FINDINGS: Pelvis is intact with normal and symmetric sacroiliac joints. There is mildly displaced and impacted subcapital fracture of left femoral neck. No other acute fracture or dislocation. No aggressive osseous lesion. Visualized sacral arcuate lines are unremarkable. Unremarkable symphysis pubis. There are mild degenerative changes of bilateral hip joints without significant joint space narrowing. Osteophytosis of the superior acetabulum. No radiopaque foreign bodies. IMPRESSION: *Mildly displaced and impacted subcapital fracture of left femoral neck. Electronically Signed   By: Ree Molt M.D.   On: 02/03/2024 18:07   DG Chest Port 1 View Result Date: 02/03/2024 CLINICAL DATA:   trauma, fall, short leg. EXAM: PORTABLE CHEST 1 VIEW COMPARISON:  04/04/2009. FINDINGS: Note is made of elevated left hemidiaphragm. Low lung volume. There are mildly increased interstitial markings throughout bilateral lungs, which is nonspecific. Differential diagnosis includes interstitial lung disease, mild pulmonary edema, atypical pneumonia, etc. No dense consolidation or lung collapse. Bilateral lateral costophrenic angles are clear. Normal cardio-mediastinal silhouette. There is a left sided 2-lead pacemaker. No acute osseous abnormalities. The soft tissues are within normal limits. There are surgical clips in the right upper quadrant, typical of a previous cholecystectomy. IMPRESSION: *Mildly increased interstitial markings throughout bilateral lungs, which is nonspecific. Differential diagnosis includes interstitial lung disease, mild pulmonary edema, atypical pneumonia, etc. No dense consolidation or lung collapse. Electronically Signed   By: Ree Molt M.D.   On: 02/03/2024 18:06   Family History Reviewed and non-contributory, no pertinent history of problems with bleeding or anesthesia    Review of Systems  Respiratory:  Positive for shortness of breath.   Cardiovascular:  Negative for chest pain.  All other systems reviewed and are negative.      OBJECTIVE  Vitals:Patient Vitals for the past 8 hrs:  BP Temp Temp src Pulse Resp SpO2  02/04/24 0753 -- 99.2 F (37.3 C) Oral -- -- --  02/04/24 0600 (!) 146/65 -- -- 91 18 95 %  02/04/24 0400 (!) 138/59 -- -- 81 18 95 %  02/04/24 0200 (!) 123/56 -- -- 82 17 96 %  02/04/24 0107 (!) 102/56 -- -- 84 17 95 %    Physical Exam Vitals and nursing note reviewed.  Constitutional:      General: She is not in acute distress.    Appearance: Normal appearance. She is not ill-appearing, toxic-appearing or diaphoretic.  HENT:     Head: Normocephalic and atraumatic.     Nose: Nose normal. No congestion or rhinorrhea.  Eyes:      General: No scleral icterus.       Right eye: No discharge.        Left eye: No discharge.     Extraocular Movements: Extraocular movements intact.     Conjunctiva/sclera: Conjunctivae normal.     Pupils: Pupils are equal, round, and reactive to light.  Cardiovascular:     Rate and Rhythm: Normal rate and regular rhythm.     Pulses:  Normal pulses.  Pulmonary:     Effort: Pulmonary effort is normal.     Breath sounds: No wheezing.  Musculoskeletal:     Right lower leg: No edema.     Left lower leg: No edema.  Skin:    General: Skin is warm and dry.     Capillary Refill: Capillary refill takes less than 2 seconds.     Coloration: Skin is not jaundiced.     Findings: No erythema.  Neurological:     General: No focal deficit present.     Mental Status: She is alert and oriented to person, place, and time.     Gait: Gait abnormal.  Psychiatric:        Mood and Affect: Mood normal.        Behavior: Behavior normal.        Thought Content: Thought content normal.        Judgment: Judgment normal.     Extremities  Normal findings of the extremities include no lacerations of the skin no erythema, no tenderness, functional range of motion, no joint subluxations, normal muscle tone with the following exceptions Right lower extremity Skin is intact Tenderness right proximal hip, rotational alignment external rotation with shortening of the limb Range of motion not tested hip is broken Instability not tested hip is fractured Motor exam no tremor.  Muscle tone normal.   Labs cbc Recent Labs    02/03/24 1731 02/04/24 0340  WBC 31.5* 19.0*  HGB 12.5 11.2*  HCT 38.0 34.8*  PLT 328 249    Labs inflam No results for input(s): CRP in the last 72 hours.  Invalid input(s): ESR  Labs coag Recent Labs    02/03/24 1731  INR 0.9    Recent Labs    02/03/24 1731 02/04/24 0340  NA 136 138  K 3.4* 4.1  CL 95* 100  CO2 25 28  GLUCOSE 218* 113*  BUN 19 23  CREATININE  0.93 0.97  CALCIUM 9.0 8.4*   Images interpreted  Pelvis and right hip x-rays show complete femoral neck fracture, no joint space narrowing.

## 2024-02-04 NOTE — ED Notes (Signed)
 Patient vomiting.

## 2024-02-04 NOTE — Interval H&P Note (Signed)
 History and Physical Interval Note:  02/04/2024 11:58 AM  [name]  has presented today for surgery, with the diagnosis of left femoral neck fracture.  The various methods of treatment have been discussed with the patient and family. After consideration of risks, benefits and other options for treatment, the patient has consented to  Procedure(s): HEMIARTHROPLASTY (BIPOLAR) HIP, POSTERIOR APPROACH FOR FRACTURE (Left) as a surgical intervention.  The patient's history has been reviewed, patient examined, no change in status, stable for surgery.  I have reviewed the patient's chart and labs.  Questions were answered to the patient's satisfaction.     Taft Minerva

## 2024-02-04 NOTE — Progress Notes (Signed)
   02/04/24 1101  TOC Brief Assessment  Insurance and Status Reviewed  Patient has primary care physician Yes  Home environment has been reviewed From home  Prior level of function: Independent  Prior/Current Home Services No current home services  Social Drivers of Health Review SDOH reviewed no interventions necessary  Readmission risk has been reviewed Yes  Transition of care needs no transition of care needs at this time   Transition of Care Department [facility]) has reviewed patient and will continue to monitor.

## 2024-02-05 ENCOUNTER — Inpatient Hospital Stay (HOSPITAL_COMMUNITY)

## 2024-02-05 DIAGNOSIS — S72002A Fracture of unspecified part of neck of left femur, initial encounter for closed fracture: Secondary | ICD-10-CM | POA: Diagnosis not present

## 2024-02-05 LAB — CBC
HCT: 27.9 % — ABNORMAL LOW (ref 36.0–46.0)
Hemoglobin: 8.7 g/dL — ABNORMAL LOW (ref 12.0–15.0)
MCH: 33.7 pg (ref 26.0–34.0)
MCHC: 31.2 g/dL (ref 30.0–36.0)
MCV: 108.1 fL — ABNORMAL HIGH (ref 80.0–100.0)
Platelets: 165 K/uL (ref 150–400)
RBC: 2.58 MIL/uL — ABNORMAL LOW (ref 3.87–5.11)
RDW: 14.8 % (ref 11.5–15.5)
WBC: 15.6 K/uL — ABNORMAL HIGH (ref 4.0–10.5)
nRBC: 0 % (ref 0.0–0.2)

## 2024-02-05 NOTE — Progress Notes (Signed)
 PROGRESS NOTE     [name], is a 78 y.o. female, DOB - 12-May-1946, FMW:[id number]  Admit date - 02/03/2024   Admitting Physician Ejiroghene FORBES Carwin, MD  Outpatient Primary MD for the patient is Orpha Yancey LABOR, MD  LOS - 2  Chief Complaint  Patient presents with   Hip Injury    left      Brief Narrative:  78 y.o. female with medical history significant for COPD, hypertension, tobacco abuse, SSS admitted on 02/03/2024 with left femoral neck fracture after mechanical fall at home (tripped over a toy)    -Assessment and Plan: 1)Left Displaced Femoral Neck Fracture  Status post mechanical fall. Mildly displaced and impacted subcapital fracture of left femoral neck. S/p ORIF by Dr. Margrette on 02/04/2024 -Postop plan  Weightbearing as tolerated Direct lateral hip precautions DVT prophylaxis for 30 days--- Lovenox  while in the hospital Remove staples at 12 to 14 days Postop appointment scheduled for 28 days --Tramadol  and Zanaflex  as ordered   2)Leukocytosis --- WBC 31.5 >>19.0>>15.6 --??  Reactive in the setting of mechanical fall and hip fracture -Also patient is chronically on steroids which may perpetuate leukocytosis, no recent WBC available since 2021 to compare ???  Pneumonia PCT <0.10 - UA Not suggestive of UTI --Continue Rocephin /azithromycin  for now repeat chest x-ray in 1 to 2 days  3)COPD (chronic obstructive pulmonary disease) (HCC) -No acute exacerbation at this time - Continue prednisone  10 mg daily .-Continue bronchodilators  4)Hypothyroidism----stable continue atorvastatin  5)GERD--- stable, continue Pepcid   6)Depression/Anxiety--stable, continue Zoloft  and use Xanax   7) acute on chronic anemia--patient has chronic anemia dating back to 2018 - Suspect some perioperative blood loss from blood loss due to hip fracture compounded by hemodilution from IV fluids - Hgb currently above 8  -no indication for transfusion at this time  Status is:  Inpatient   Disposition: The patient is from: Home              Anticipated d/c is to: SNF              Anticipated d/c date is: 1 day              Patient currently is not medically stable to d/c. Barriers: Not Clinically Stable-   Code Status :  -  Code Status: Full Code   Family Communication:    NA (patient is alert, awake and coherent)   DVT Prophylaxis  :   - SCDs  enoxaparin  (LOVENOX ) injection 40 mg Start: 02/05/24 1000 SCDs Start: 02/04/24 1610 Place TED hose Start: 02/04/24 1610 SCDs Start: 02/03/24 2254   Lab Results  Component Value Date   PLT 165 02/05/2024    Inpatient Medications  Scheduled Meds:  ALPRAZolam   1 mg Oral BID   docusate sodium   100 mg Oral BID   enoxaparin  (LOVENOX ) injection  40 mg Subcutaneous Q24H   famotidine   40 mg Oral BID   fluticasone  furoate-vilanterol  1 puff Inhalation Daily   gabapentin   400 mg Oral Daily   gabapentin   800 mg Oral QHS   levothyroxine   25 mcg Oral Q0600   predniSONE   10 mg Oral Q breakfast   sertraline   50 mg Oral Daily   tiZANidine   2 mg Oral BID   traMADol   50 mg Oral Q6H   Continuous Infusions:  azithromycin  500 mg (02/05/24 0555)   cefTRIAXone  (ROCEPHIN )  IV 2 g (02/05/24 0456)   PRN Meds:.acetaminophen  **OR** acetaminophen , acetaminophen , HYDROcodone -acetaminophen , HYDROcodone -acetaminophen , ipratropium-albuterol , menthol -cetylpyridinium **OR** phenol,  methocarbamol  **OR** methocarbamol  (ROBAXIN ) injection, metoCLOPramide  **OR** metoCLOPramide  (REGLAN ) injection, morphine  injection, morphine  injection, ondansetron  **OR** ondansetron  (ZOFRAN ) IV, polyethylene glycol   Anti-infectives (From admission, onward)    Start     Dose/Rate Route Frequency Ordered Stop   02/04/24 1800  ceFAZolin  (ANCEF ) IVPB 2g/100 mL premix        2 g 200 mL/hr over 30 Minutes Intravenous Every 6 hours 02/04/24 1609 02/05/24 0032   02/04/24 1100  ceFAZolin  (ANCEF ) IVPB 2g/100 mL premix        2 g 200 mL/hr over 30 Minutes  Intravenous On call to O.R. 02/04/24 1054 02/04/24 1232   02/03/24 2300  cefTRIAXone  (ROCEPHIN ) 2 g in sodium chloride  0.9 % 100 mL IVPB        2 g 200 mL/hr over 30 Minutes Intravenous Every 24 hours 02/03/24 2254     02/03/24 2300  azithromycin  (ZITHROMAX ) 500 mg in sodium chloride  0.9 % 250 mL IVPB        500 mg 250 mL/hr over 60 Minutes Intravenous Every 24 hours 02/03/24 2254        Subjective: [name] today has no fevers, no emesis,  No chest pain,   - Sister-in-law at bedside, questions answered - Patient is sitting up in a chair   Objective: Vitals:   02/04/24 2323 02/05/24 0331 02/05/24 0746 02/05/24 1442  BP: (!) 90/46 (!) 119/45  (!) 127/59  Pulse: 66 67  61  Resp: 16 16  14   Temp: 98.7 F (37.1 C) 98 F (36.7 C)  98.4 F (36.9 C)  TempSrc: Oral Oral  Oral  SpO2: 96% 95% 92% 98%  Weight:      Height:        Intake/Output Summary (Last 24 hours) at 02/05/2024 1902 Last data filed at 02/05/2024 0552 Gross per 24 hour  Intake 960 ml  Output 1100 ml  Net -140 ml   Filed Weights   02/03/24 1713 02/04/24 0757  Weight: 53 kg 52.6 kg    Physical Exam  Gen:- Awake Alert,  in no apparent distress  HEENT:- [place].AT, No sclera icterus Neck-Supple Neck,No JVD,.  Lungs-  CTAB , fair symmetrical air movement CV- S1, S2 normal, regular  Abd-  +ve B.Sounds, Abd Soft, No tenderness,    Extremity/Skin:- No  edema, pedal pulses present  Psych-affect is appropriate, oriented x3 Neuro-denies weakness, no new focal deficits, no tremors MSK-left hip postoperative wound is clean dry and intact  Data Reviewed: I have personally reviewed following labs and imaging studies  CBC: Recent Labs  Lab 02/03/24 1731 02/04/24 0340 02/05/24 0423  WBC 31.5* 19.0* 15.6*  NEUTROABS 28.4*  --   --   HGB 12.5 11.2* 8.7*  HCT 38.0 34.8* 27.9*  MCV 104.4* 104.8* 108.1*  PLT 328 249 165   Basic Metabolic Panel: Recent Labs  Lab 02/03/24 1731 02/04/24 0340  NA 136 138  K  3.4* 4.1  CL 95* 100  CO2 25 28  GLUCOSE 218* 113*  BUN 19 23  CREATININE 0.93 0.97  CALCIUM 9.0 8.4*   GFR: Estimated Creatinine Clearance: 37.8 mL/min (by C-G formula based on SCr of 0.97 mg/dL).  Radiology Studies: DG HIP UNILAT WITH PELVIS 2-3 VIEWS LEFT Result Date: 02/04/2024 CLINICAL DATA:  Status post hemiarthroplasty. EXAM: DG HIP (WITH OR WITHOUT PELVIS) 2-3V LEFT COMPARISON:  Preoperative imaging. FINDINGS: Left hip hemiarthroplasty in expected alignment. No periprosthetic lucency or fracture. Recent postsurgical change includes air and edema in the soft tissues. Overlying  skin staples in place. IMPRESSION: Left hip hemiarthroplasty without immediate postoperative complication. Electronically Signed   By: Andrea Gasman M.D.   On: 02/04/2024 15:24   Scheduled Meds:  ALPRAZolam   1 mg Oral BID   docusate sodium   100 mg Oral BID   enoxaparin  (LOVENOX ) injection  40 mg Subcutaneous Q24H   famotidine   40 mg Oral BID   fluticasone  furoate-vilanterol  1 puff Inhalation Daily   gabapentin   400 mg Oral Daily   gabapentin   800 mg Oral QHS   levothyroxine   25 mcg Oral Q0600   predniSONE   10 mg Oral Q breakfast   sertraline   50 mg Oral Daily   tiZANidine   2 mg Oral BID   traMADol   50 mg Oral Q6H   Continuous Infusions:  azithromycin  500 mg (02/05/24 0555)   cefTRIAXone  (ROCEPHIN )  IV 2 g (02/05/24 0456)    LOS: 2 days   Rendall Carwin M.D on 02/05/2024 at 7:02 PM  Go to [email or web address] - for contact info  [facility] - Office  [phone]  If 7PM-7AM, please contact night-coverage [email or web address] 02/05/2024, 7:02 PM

## 2024-02-05 NOTE — [place] FL2 (Signed)
 [place]  MEDICAID FL2 LEVEL OF CARE FORM     IDENTIFICATION  Patient Name: [name] Birthdate: January 11, 1946 Sex: female Admission Date (Current Location): 02/03/2024  [facility] [place] Number:  [employer] and Address:  [facility],  [address] [place] [address]      Provider Number: [id number]  Attending Physician Name and Address:  Pearlean Manus, MD  Relative Name and Phone Number:  Blessen Kimbrough (husband) [phone]    Current Level of Care: Hospital Recommended Level of Care: Skilled Nursing Facility Prior Approval Number:    Date Approved/Denied: 02/05/24 PASRR Number: [id number]  Discharge Plan: SNF    Current Diagnoses: Patient Active Problem List   Diagnosis Date Noted   Left displaced femoral neck fracture (HCC) 02/03/2024   COPD (chronic obstructive pulmonary disease) (HCC) 02/03/2024   Leukocytosis 02/03/2024   Iron deficiency anemia 03/15/2019   Anemia 03/14/2019   Macrocytic anemia 03/14/2019   IDA (iron deficiency anemia) 03/14/2019   Nausea with vomiting 03/14/2019   Abdominal pain, chronic, epigastric 03/14/2019   Rectal bleeding 02/23/2019   Dysphagia 12/24/2016   Chest pain 05/18/2013   Shortness of breath 05/18/2013   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   TOBACCO ABUSE 04/18/2009   VALVULAR HEART DISEASE 04/18/2009   Sick sinus syndrome (HCC) 03/19/2009    Orientation RESPIRATION BLADDER Height & Weight     Self, Time, Situation, Place  O2 External catheter Weight: 52.6 kg Height:  5' 2 (157.5 cm)  BEHAVIORAL SYMPTOMS/MOOD NEUROLOGICAL BOWEL NUTRITION STATUS      Continent Diet (see dc summary)  AMBULATORY STATUS COMMUNICATION OF NEEDS Skin   Extensive Assist Verbally Surgical wounds, Bruising                       Personal Care Assistance Level of Assistance  Bathing, Feeding, Dressing Bathing Assistance: Limited assistance Feeding assistance: Independent Dressing Assistance: Limited  assistance     Functional Limitations Info  Sight, Hearing, Speech Sight Info: Adequate Hearing Info: Adequate Speech Info: Adequate    SPECIAL CARE FACTORS FREQUENCY  PT (By licensed PT), OT (By licensed OT)     PT Frequency: 5x weekly OT Frequency: 5x weekly            Contractures      Additional Factors Info  Code Status, Allergies Code Status Info: Full Allergies Info: None known           Current Medications (02/05/2024):  This is the current hospital active medication list Current Facility-Administered Medications  Medication Dose Route Frequency Provider Last Rate Last Admin   0.9 %  sodium chloride  infusion   Intravenous Continuous Margrette Taft BRAVO, MD 75 mL/hr at 02/05/24 0001 New Bag at 02/05/24 0001   acetaminophen  (TYLENOL ) tablet 650 mg  650 mg Oral Q6H PRN Margrette Taft BRAVO, MD       Or   acetaminophen  (TYLENOL ) suppository 650 mg  650 mg Rectal Q6H PRN Harrison, Stanley E, MD       acetaminophen  (TYLENOL ) tablet 325-650 mg  325-650 mg Oral Q6H PRN Margrette Taft BRAVO, MD       acetaminophen  (TYLENOL ) tablet 500 mg  500 mg Oral Q6H Margrette Taft BRAVO, MD   500 mg at 02/05/24 0457   ALPRAZolam  (XANAX ) tablet 1 mg  1 mg Oral BID Harrison, Stanley E, MD   1 mg at 02/05/24 0920   azithromycin  (ZITHROMAX ) 500 mg in sodium chloride  0.9 % 250 mL IVPB  500  mg Intravenous Q24H Margrette Taft BRAVO, MD 250 mL/hr at 02/05/24 0555 500 mg at 02/05/24 0555   cefTRIAXone  (ROCEPHIN ) 2 g in sodium chloride  0.9 % 100 mL IVPB  2 g Intravenous Q24H Margrette Taft BRAVO, MD 200 mL/hr at 02/05/24 0456 2 g at 02/05/24 0456   docusate sodium  (COLACE) capsule 100 mg  100 mg Oral BID Margrette Taft BRAVO, MD   100 mg at 02/05/24 9078   enoxaparin  (LOVENOX ) injection 40 mg  40 mg Subcutaneous Q24H Margrette Taft BRAVO, MD   40 mg at 02/05/24 9075   famotidine  (PEPCID ) tablet 40 mg  40 mg Oral BID Harrison, Stanley E, MD   40 mg at 02/05/24 9246   fluticasone  furoate-vilanterol  (BREO ELLIPTA ) 200-25 MCG/ACT 1 puff  1 puff Inhalation Daily Margrette Taft BRAVO, MD   1 puff at 02/05/24 0746   gabapentin  (NEURONTIN ) capsule 400 mg  400 mg Oral Daily Margrette Taft BRAVO, MD   400 mg at 02/05/24 9078   gabapentin  (NEURONTIN ) capsule 800 mg  800 mg Oral QHS Harrison, Stanley E, MD   800 mg at 02/04/24 2038   HYDROcodone -acetaminophen  (NORCO) 7.5-325 MG per tablet 1-2 tablet  1-2 tablet Oral Q4H PRN Harrison, Stanley E, MD       HYDROcodone -acetaminophen  (NORCO/VICODIN) 5-325 MG per tablet 1-2 tablet  1-2 tablet Oral Q4H PRN Harrison, Stanley E, MD   1 tablet at 02/05/24 0920   ipratropium-albuterol  (DUONEB) 0.5-2.5 (3) MG/3ML nebulizer solution 3 mL  3 mL Nebulization Q4H PRN Margrette Taft BRAVO, MD       levothyroxine  (SYNTHROID ) tablet 25 mcg  25 mcg Oral Q0600 Harrison, Stanley E, MD   25 mcg at 02/05/24 0457   menthol -cetylpyridinium (CEPACOL) lozenge 3 mg  1 lozenge Oral PRN Harrison, Stanley E, MD       Or   phenol (CHLORASEPTIC) mouth spray 1 spray  1 spray Mouth/Throat PRN Margrette Taft BRAVO, MD       methocarbamol  (ROBAXIN ) tablet 500 mg  500 mg Oral Q6H PRN Harrison, Stanley E, MD   500 mg at 02/05/24 9663   Or   methocarbamol  (ROBAXIN ) injection 500 mg  500 mg Intravenous Q6H PRN Margrette Taft BRAVO, MD       metoCLOPramide  (REGLAN ) tablet 5-10 mg  5-10 mg Oral Q8H PRN Margrette Taft BRAVO, MD       Or   metoCLOPramide  (REGLAN ) injection 5-10 mg  5-10 mg Intravenous Q8H PRN Margrette Taft BRAVO, MD       morphine  (PF) 2 MG/ML injection 0.5-1 mg  0.5-1 mg Intravenous Q2H PRN Margrette Taft BRAVO, MD       morphine  (PF) 2 MG/ML injection 2 mg  2 mg Intravenous Q3H PRN Harrison, Stanley E, MD   2 mg at 02/04/24 9057   ondansetron  (ZOFRAN ) tablet 4 mg  4 mg Oral Q6H PRN Margrette Taft BRAVO, MD       Or   ondansetron  (ZOFRAN ) injection 4 mg  4 mg Intravenous Q6H PRN Harrison, Stanley E, MD   4 mg at 02/05/24 0638   polyethylene glycol (MIRALAX  / GLYCOLAX ) packet 17 g  17  g Oral Daily PRN Margrette Taft BRAVO, MD       predniSONE  (DELTASONE ) tablet 10 mg  10 mg Oral Q breakfast Margrette Taft BRAVO, MD   10 mg at 02/05/24 0753   sertraline  (ZOLOFT ) tablet 50 mg  50 mg Oral Daily Margrette Taft BRAVO, MD   50 mg at 02/05/24 [phone]  tiZANidine  (ZANAFLEX ) tablet 2 mg  2 mg Oral BID Margrette Taft BRAVO, MD   2 mg at 02/05/24 9078   traMADol  (ULTRAM ) tablet 50 mg  50 mg Oral Q6H Harrison, Stanley E, MD   50 mg at 02/05/24 1220     Discharge Medications: Please see discharge summary for a list of discharge medications.  Relevant Imaging Results:  Relevant Lab Results:   Additional Information SSN: [id number]  Nena LITTIE Coffee, RN

## 2024-02-05 NOTE — Evaluation (Signed)
 Physical Therapy Evaluation Patient Details Name: [name] MRN: [id number] DOB: 1946-05-08 Today's Date: 02/05/2024  History of Present Illness  78 y.o. female with medical history significant for COPD, hypertension, tobacco abuse, SSS admitted on 02/03/2024 with left femoral neck fracture after mechanical fall at home (tripped over a toy)  Clinical Impression   Pt tolerated today's Physical Therapy Evaluation, well with moderate pain increase during ambulation. Pt tolerated upright and small steps to recliner on R side. Pt with small LOB posteriorly in standing, overall min<>mod assist for transfers and balance due to pain and muscle weakness. Pt's baseline is independent with mobility/ambulation no AD and independent with ADLs, was driving. Currently, pt demonstrating significant limitations in functional mobility, transfers, ADLs and ambulation due to muscle weakness, L hemi arthoplasty, balance deficits in setting of pain. Pt preferring Home with husband to assist, pt was educated on PT recommendations and will continue to monitor progress.   Based upon these deficits/impairments, patient will benefit from continued skilled physical therapy services during remainder of hospital stay and at the next recommended venue of care to address deficits and promote return to optimal function.                If plan is discharge home, recommend the following: A lot of help with walking and/or transfers;A lot of help with bathing/dressing/bathroom   Can travel by private vehicle        Equipment Recommendations None recommended by PT  Recommendations for Other Services       Functional Status Assessment Patient has had a recent decline in their functional status and demonstrates the ability to make significant improvements in function in a reasonable and predictable amount of time.     Precautions / Restrictions Precautions Precautions:  (direct lateral hip  precautions) Restrictions Weight Bearing Restrictions Per Provider Order: No LLE Weight Bearing Per Provider Order: Weight bearing as tolerated      Mobility  Bed Mobility Overal bed mobility: Needs Assistance Bed Mobility: Supine to Sit     Supine to sit: Mod assist, Min assist     General bed mobility comments: slow, labored with HOB elevated, therapist assisting at LLE toward EOB right side. Mod assist for anterior scooting toward EOB Patient Response: Cooperative  Transfers Overall transfer level: Needs assistance   Transfers: Sit to/from Stand Sit to Stand: Mod assist           General transfer comment: mod assist with sit/stand from EOB with verbal cues and tactile cues for proper movement patterns. Pt with small LOB posteriorly multiple times, min assist to correct.    Ambulation/Gait Ambulation/Gait assistance: Min assist, Mod assist Gait Distance (Feet): 4.5 Feet Assistive device: Rolling walker (2 wheels) Gait Pattern/deviations: Step-to pattern, Decreased step length - left, Decreased stance time - left, Decreased weight shift to left       General Gait Details: 1.79ft ambulation forward, lateral and then posteriorly into recliner. Education prior to ambulation regarding RW management and LLE placement. Verbal and tactile cues throughout ambulation trial.  Stairs            Wheelchair Mobility     Tilt Bed Tilt Bed Patient Response: Cooperative  Modified Rankin (Stroke Patients Only)       Balance Overall balance assessment: Needs assistance Sitting-balance support: No upper extremity supported, Feet supported, Bilateral upper extremity supported, Single extremity supported Sitting balance-Leahy Scale: Fair Sitting balance - Comments: sitting EOB   Standing balance support: Bilateral upper extremity supported, During functional  activity, Reliant on assistive device for balance Standing balance-Leahy Scale: Poor Standing balance comment:  LOB posteriorly                             Pertinent Vitals/Pain Pain Assessment Pain Assessment: Faces Faces Pain Scale: Hurts even more Pain Location: LLE Pain Descriptors / Indicators: Aching, Crying    Home Living Family/patient expects to be discharged to:: Private residence Living Arrangements: Spouse/significant other Available Help at Discharge: Family;Friend(s);Available 24 hours/day Type of Home: Mobile home Home Access: Stairs to enter   Entrance Stairs-Number of Steps: 1   Home Layout: One level Home Equipment: Agricultural consultant (2 wheels) Additional Comments: Sleeps in recliner.    Prior Function Prior Level of Function : Independent/Modified Independent             Mobility Comments: independent in household and community ambulation without AD; drives ADLs Comments: independent     Extremity/Trunk Assessment   Upper Extremity Assessment Upper Extremity Assessment: Defer to OT evaluation    Lower Extremity Assessment Lower Extremity Assessment: Generalized weakness;RLE deficits/detail;LLE deficits/detail RLE Deficits / Details: 4+/5 knee extension RLE Sensation: WNL LLE Deficits / Details: 3-/5 knee extension and ankle dorsiflexion, 2/5 long sitting hip adduction LLE Sensation: WNL    Cervical / Trunk Assessment Cervical / Trunk Assessment: Kyphotic  Communication   Communication Communication: No apparent difficulties    Cognition Arousal: Alert Behavior During Therapy: WFL for tasks assessed/performed   PT - Cognitive impairments: No apparent impairments                         Following commands: Intact       Cueing Cueing Techniques: Verbal cues, Tactile cues     General Comments      Exercises     Assessment/Plan    PT Assessment Patient needs continued PT services  PT Problem List Decreased strength;Decreased activity tolerance;Decreased range of motion;Decreased balance;Decreased mobility       PT  Treatment Interventions Gait training;Functional mobility training;Therapeutic activities;DME instruction;Therapeutic exercise;Balance training;Neuromuscular re-education    PT Goals (Current goals can be found in the Care Plan section)  Acute Rehab PT Goals Patient Stated Goal: return home for husband to assist with [facility] services PT Goal Formulation: With patient Time For Goal Achievement: 02/19/24 Potential to Achieve Goals: Good    Frequency Min 4X/week     Co-evaluation               AM-PAC PT 6 Clicks Mobility  Outcome Measure Help needed turning from your back to your side while in a flat bed without using bedrails?: A Lot Help needed moving from lying on your back to sitting on the side of a flat bed without using bedrails?: A Lot Help needed moving to and from a bed to a chair (including a wheelchair)?: A Lot Help needed standing up from a chair using your arms (e.g., wheelchair or bedside chair)?: A Lot Help needed to walk in hospital room?: A Lot Help needed climbing 3-5 steps with a railing? : Total 6 Click Score: 11    End of Session Equipment Utilized During Treatment: Gait belt Activity Tolerance: Patient tolerated treatment well;Patient limited by pain Patient left: in chair;with call bell/phone within reach;with family/visitor present Nurse Communication: Mobility status PT Visit Diagnosis: Unsteadiness on feet (R26.81);Muscle weakness (generalized) (M62.81)    Time: 9075-8975 PT Time Calculation (min) (ACUTE ONLY): 60 min  Charges:   PT Evaluation $PT Eval Low Complexity: 1 Low PT Treatments $Therapeutic Activity: 23-37 mins PT General Charges $$ ACUTE PT VISIT: 1 Visit         Omega JONETTA Donna ALMETA, DPT [facility] [place] [phone] office  Omega JONETTA Donna 02/05/2024, 10:37 AM

## 2024-02-05 NOTE — TOC Initial Note (Signed)
 Transition of Care [facility]) - Initial/Assessment Note    Patient Details  Name: [name] MRN: [id number] Date of Birth: 10-26-1945  [facility] CM/SW Contact:    Nena LITTIE Coffee, RN Phone Number: 02/05/2024, 11:25 AM  Clinical Narrative:                 Pt admitted c/left hip fx and surgical repair. PT recommends SNF however pt's choice is to go home. Unit staff to assess husbands ability to assist pt when he returns. Pt is POD 1 c/weakness and significant pain c/movement. Pt has cane, wc and bsc. TOC to follow, SNF vs HH.   Expected Discharge Plan: Skilled Nursing Facility Barriers to Discharge: Continued Medical Work up   Patient Goals and CMS Choice Patient states their goals for this hospitalization and ongoing recovery are:: Return home CMS Medicare.gov Compare Post Acute Care list provided to:: Patient Choice offered to / list presented to : Patient [place] ownership interest in [facility].provided to:: Patient    Expected Discharge Plan and Services In-house Referral: Clinical Social Work Discharge Planning Services: CM Consult   Living arrangements for the past 2 months: Single Family Home                                      Prior Living Arrangements/Services Living arrangements for the past 2 months: Single Family Home Lives with:: Spouse Patient language and need for interpreter reviewed:: Yes Do you feel safe going back to the place where you live?: Yes      Need for Family Participation in Patient Care: Yes (Comment) Care giver support system in place?: Yes (comment) Current home services: DME (cane, wc, bsc) Criminal Activity/Legal Involvement Pertinent to Current Situation/Hospitalization: No - Comment as needed  Activities of Daily Living   ADL Screening (condition at time of admission) Independently performs ADLs?: Yes (appropriate for developmental age) Is the patient deaf or have difficulty hearing?: No Does the  patient have difficulty seeing, even when wearing glasses/contacts?: No Does the patient have difficulty concentrating, remembering, or making decisions?: No  Permission Sought/Granted                  Emotional Assessment Appearance:: Appears stated age Attitude/Demeanor/Rapport: Engaged (Drowsy r/t pain meds.) Affect (typically observed): Appropriate, Calm Orientation: : Oriented to Self, Oriented to Place, Oriented to  Time, Oriented to Situation Alcohol / Substance Use: Not Applicable Psych Involvement: No (comment)  Admission diagnosis:  Closed left hip fracture (HCC) [S72.002A] Closed fracture of left hip, initial encounter (HCC) [S72.002A] Leukocytosis, unspecified type [D72.829] Patient Active Problem List   Diagnosis Date Noted   Left displaced femoral neck fracture (HCC) 02/03/2024   COPD (chronic obstructive pulmonary disease) (HCC) 02/03/2024   Leukocytosis 02/03/2024   Iron deficiency anemia 03/15/2019   Anemia 03/14/2019   Macrocytic anemia 03/14/2019   IDA (iron deficiency anemia) 03/14/2019   Nausea with vomiting 03/14/2019   Abdominal pain, chronic, epigastric 03/14/2019   Rectal bleeding 02/23/2019   Dysphagia 12/24/2016   Chest pain 05/18/2013   Shortness of breath 05/18/2013   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   TOBACCO ABUSE 04/18/2009   VALVULAR HEART DISEASE 04/18/2009   Sick sinus syndrome (HCC) 03/19/2009   PCP:  Orpha Yancey LABOR, MD Pharmacy:   [facility] [address], [place] - [address] [place] [address] Phone: [phone] Fax: [phone]  Social Drivers of Health (SDOH) Social History: SDOH Screenings   Food Insecurity: Patient Declined (02/04/2024)  Housing: Patient Declined (02/04/2024)  Transportation Needs: Patient Declined (02/04/2024)  Utilities: Patient Declined (02/04/2024)  Social Connections: Patient Declined (02/04/2024)  Tobacco Use: Medium Risk (02/04/2024)  Health Literacy: High Risk (11/26/2020)    [facility] from [facility]   SDOH Interventions:     Readmission Risk Interventions    02/05/2024   11:21 AM 02/04/2024   11:01 AM  Readmission Risk Prevention Plan  Post Dischage Appt  Complete  Medication Screening  Complete  Transportation Screening Complete Complete  PCP or Specialist Appt within 5-7 Days Complete   Home Care Screening Complete   Medication Review (RN CM) Complete

## 2024-02-05 NOTE — Progress Notes (Signed)
 Subjective: 1 Day Post-Op Procedure(s) (LRB): HEMIARTHROPLASTY (BIPOLAR) HIP, POSTERIOR APPROACH FOR FRACTURE (Left) Patient reports pain as mild.    Objective: Vital signs in last 24 hours: Temp:  [98 F (36.7 C)-99.6 F (37.6 C)] 98 F (36.7 C) (07/12 0331) Pulse Rate:  [65-98] 67 (07/12 0331) Resp:  [12-22] 16 (07/12 0331) BP: (90-182)/(45-107) 119/45 (07/12 0331) SpO2:  [86 %-100 %] 92 % (07/12 0746)  Intake/Output from previous day: 07/11 0701 - 07/12 0700 In: 2856.1 [P.O.:1200; I.V.:1106.1; IV Piggyback:550] Out: 1900 [Urine:1600; Blood:300] Intake/Output this shift: No intake/output data recorded.  Recent Labs    02/03/24 1731 02/04/24 0340 02/05/24 0423  HGB 12.5 11.2* 8.7*   Recent Labs    02/04/24 0340 02/05/24 0423  WBC 19.0* 15.6*  RBC 3.32* 2.58*  HCT 34.8* 27.9*  PLT 249 165   Recent Labs    02/03/24 1731 02/04/24 0340  NA 136 138  K 3.4* 4.1  CL 95* 100  CO2 25 28  BUN 19 23  CREATININE 0.93 0.97  GLUCOSE 218* 113*  CALCIUM 9.0 8.4*   Recent Labs    02/03/24 1731  INR 0.9    Neurologically intact ABD soft Neurovascular intact Sensation intact distally Intact pulses distally Dorsiflexion/Plantar flexion intact Incision: no drainage Compartment soft   Assessment/Plan: 1 Day Post-Op Procedure(s) (LRB): HEMIARTHROPLASTY (BIPOLAR) HIP, POSTERIOR APPROACH FOR FRACTURE (Left) Advance diet Up with therapy D/C IV fluids      Taft Minerva 02/05/2024, 8:49 AM

## 2024-02-05 NOTE — Plan of Care (Signed)
  Problem: Skin Integrity: Goal: Risk for impaired skin integrity will decrease Outcome: Progressing   Problem: Activity: Goal: Ability to ambulate and perform ADLs will improve Outcome: Progressing   Problem: Pain Management: Goal: Pain level will decrease Outcome: Progressing

## 2024-02-05 NOTE — Plan of Care (Signed)
  Problem: Acute Rehab PT Goals(only PT should resolve) Goal: Pt Will Go Supine/Side To Sit Flowsheets (Taken 02/05/2024 1040) Pt will go Supine/Side to Sit: Independently Goal: Patient Will Perform Sitting Balance Flowsheets (Taken 02/05/2024 1040) Patient will perform sitting balance: Independently Goal: Patient Will Transfer Sit To/From Stand Flowsheets (Taken 02/05/2024 1040) Patient will transfer sit to/from stand: Independently Goal: Pt Will Transfer Bed To Chair/Chair To Bed Flowsheets (Taken 02/05/2024 1040) Pt will Transfer Bed to Chair/Chair to Bed: Independently Goal: Pt Will Perform Standing Balance Or Pre-Gait Flowsheets (Taken 02/05/2024 1040) Pt will perform standing balance or pre-gait: Independently Goal: Pt Will Ambulate Flowsheets (Taken 02/05/2024 1040) Pt will Ambulate:  50 feet  75 feet  with rolling walker  with modified independence  Omega JONETTA Bottcher PT, DPT [facility] [place] [phone] office

## 2024-02-06 ENCOUNTER — Inpatient Hospital Stay (HOSPITAL_COMMUNITY)

## 2024-02-06 DIAGNOSIS — S72002A Fracture of unspecified part of neck of left femur, initial encounter for closed fracture: Secondary | ICD-10-CM | POA: Diagnosis not present

## 2024-02-06 LAB — CBC
HCT: 27.1 % — ABNORMAL LOW (ref 36.0–46.0)
Hemoglobin: 8.7 g/dL — ABNORMAL LOW (ref 12.0–15.0)
MCH: 34.5 pg — ABNORMAL HIGH (ref 26.0–34.0)
MCHC: 32.1 g/dL (ref 30.0–36.0)
MCV: 107.5 fL — ABNORMAL HIGH (ref 80.0–100.0)
Platelets: 169 K/uL (ref 150–400)
RBC: 2.52 MIL/uL — ABNORMAL LOW (ref 3.87–5.11)
RDW: 14.6 % (ref 11.5–15.5)
WBC: 17.1 K/uL — ABNORMAL HIGH (ref 4.0–10.5)
nRBC: 0 % (ref 0.0–0.2)

## 2024-02-06 MED ORDER — DM-GUAIFENESIN ER 30-600 MG PO TB12
1.0000 | ORAL_TABLET | Freq: Two times a day (BID) | ORAL | Status: DC
  Administered 2024-02-06 – 2024-02-08 (×5): 1 via ORAL
  Filled 2024-02-06 (×5): qty 1

## 2024-02-06 MED ORDER — ALPRAZOLAM 0.5 MG PO TABS
0.5000 mg | ORAL_TABLET | Freq: Two times a day (BID) | ORAL | Status: DC | PRN
  Administered 2024-02-06 – 2024-02-07 (×3): 0.5 mg via ORAL
  Filled 2024-02-06 (×3): qty 1

## 2024-02-06 MED ORDER — METHYLPREDNISOLONE SODIUM SUCC 40 MG IJ SOLR
40.0000 mg | Freq: Two times a day (BID) | INTRAMUSCULAR | Status: DC
  Administered 2024-02-06 – 2024-02-08 (×5): 40 mg via INTRAVENOUS
  Filled 2024-02-06 (×5): qty 1

## 2024-02-06 MED ORDER — IPRATROPIUM-ALBUTEROL 0.5-2.5 (3) MG/3ML IN SOLN
3.0000 mL | Freq: Three times a day (TID) | RESPIRATORY_TRACT | Status: DC
  Administered 2024-02-06 (×2): 3 mL via RESPIRATORY_TRACT
  Filled 2024-02-06 (×3): qty 3

## 2024-02-06 MED ORDER — ALBUTEROL SULFATE (2.5 MG/3ML) 0.083% IN NEBU: 2.5000 mg | INHALATION_SOLUTION | RESPIRATORY_TRACT | Status: DC | PRN

## 2024-02-06 NOTE — Progress Notes (Signed)
 PROGRESS NOTE     [name], is a 78 y.o. female, DOB - 1946-04-12, FMW:[id number]  Admit date - 02/03/2024   Admitting Physician Ejiroghene FORBES Carwin, MD  Outpatient Primary MD for the patient is Orpha Yancey LABOR, MD  LOS - 3  Chief Complaint  Patient presents with   Hip Injury    left      Brief Narrative:  78 y.o. female with medical history significant for COPD, hypertension, tobacco abuse, SSS admitted on 02/03/2024 with left femoral neck fracture after mechanical fall at home (tripped over a toy)    -Assessment and Plan: 1)Left Displaced Femoral Neck Fracture  Status post mechanical fall. Mildly displaced and impacted subcapital fracture of left femoral neck. S/p ORIF by Dr. Margrette on 02/04/2024 -Postop plan  Weightbearing as tolerated Direct lateral hip precautions DVT prophylaxis for 30 days--- Lovenox  while in the hospital Remove staples at 12 to 14 days Postop appointment scheduled for 28 days --Tramadol  and Zanaflex  as ordered   2) acute hypoxic respiratory failure due to left-sided pneumonia Lt Sided CAP --POA  --- WBC 31.5 >>19.0>>15.6>>17.1 -Also patient is chronically on steroids which may perpetuate leukocytosis, no recent WBC available since 2021 to compare -Chest x-ray from 02/06/2024 suggest left-sided pneumonia --Continue Rocephin /azithromycin   - IV steroids - Bronchodilators, mucolytics and supplemental oxygen  as ordered - Currently requiring 2 to 2 L of oxygen  via nasal cannula - Was Not on home O2 prior to admission  3) acute COPD exacerbation--due to left-sided pneumonia as above pain #2 - -PTA patient was on prednisone  10 mg daily -Treat empirically with IV Solu-Medrol  for now .-Continue bronchodilators, mucolytic's and oxygen   4)Hypothyroidism----stable continue atorvastatin  5)GERD--- stable, continue Pepcid   6)Depression/Anxiety--stable, continue Zoloft  and use Xanax   7) acute on chronic anemia--patient has chronic anemia dating back  to 2018 - Suspect some perioperative blood loss from blood loss due to hip fracture compounded by hemodilution from IV fluids - Hgb currently above 8  -no indication for transfusion at this time  Status is: Inpatient   Disposition: The patient is from: Home              Anticipated d/c is to: SNF              Anticipated d/c date is: 1 day              Patient currently is not medically stable to d/c. Barriers: Not Clinically Stable-   Code Status :  -  Code Status: Full Code   Family Communication:     (patient is alert, awake and coherent)  - Husband at bedside, questions answered  DVT Prophylaxis  :   - SCDs  enoxaparin  (LOVENOX ) injection 40 mg Start: 02/05/24 1000 SCDs Start: 02/04/24 1610 Place TED hose Start: 02/04/24 1610 SCDs Start: 02/03/24 2254   Lab Results  Component Value Date   PLT 169 02/06/2024    Inpatient Medications  Scheduled Meds:  dextromethorphan -guaiFENesin   1 tablet Oral BID   docusate sodium   100 mg Oral BID   enoxaparin  (LOVENOX ) injection  40 mg Subcutaneous Q24H   famotidine   40 mg Oral BID   fluticasone  furoate-vilanterol  1 puff Inhalation Daily   gabapentin   400 mg Oral Daily   gabapentin   800 mg Oral QHS   ipratropium-albuterol   3 mL Nebulization TID   levothyroxine   25 mcg Oral Q0600   methylPREDNISolone  (SOLU-MEDROL ) injection  40 mg Intravenous Q12H   sertraline   50 mg Oral Daily  tiZANidine   2 mg Oral BID   Continuous Infusions:  azithromycin  Stopped (02/06/24 0645)   cefTRIAXone  (ROCEPHIN )  IV Stopped (02/06/24 0530)   PRN Meds:.acetaminophen  **OR** acetaminophen , acetaminophen , albuterol , ALPRAZolam , HYDROcodone -acetaminophen , HYDROcodone -acetaminophen , menthol -cetylpyridinium **OR** phenol, methocarbamol  **OR** methocarbamol  (ROBAXIN ) injection, metoCLOPramide  **OR** metoCLOPramide  (REGLAN ) injection, morphine  injection, morphine  injection, ondansetron  **OR** ondansetron  (ZOFRAN ) IV, polyethylene glycol   Anti-infectives  (From admission, onward)    Start     Dose/Rate Route Frequency Ordered Stop   02/04/24 1800  ceFAZolin  (ANCEF ) IVPB 2g/100 mL premix        2 g 200 mL/hr over 30 Minutes Intravenous Every 6 hours 02/04/24 1609 02/05/24 0032   02/04/24 1100  ceFAZolin  (ANCEF ) IVPB 2g/100 mL premix        2 g 200 mL/hr over 30 Minutes Intravenous On call to O.R. 02/04/24 1054 02/04/24 1232   02/03/24 2300  cefTRIAXone  (ROCEPHIN ) 2 g in sodium chloride  0.9 % 100 mL IVPB        2 g 200 mL/hr over 30 Minutes Intravenous Every 24 hours 02/03/24 2254     02/03/24 2300  azithromycin  (ZITHROMAX ) 500 mg in sodium chloride  0.9 % 250 mL IVPB        500 mg 250 mL/hr over 60 Minutes Intravenous Every 24 hours 02/03/24 2254        Subjective: [name] today has no fevers, no emesis,  No chest pain,   - -Was more sleepy earlier after opiate - Husband at bedside questions answered - O2 sats dropped to 78% on room air - Currently doing well on 2 to 3 L of oxygen  via nasal cannula   Objective: Vitals:   02/05/24 2109 02/06/24 0456 02/06/24 1122 02/06/24 1419  BP: (!) 111/43 (!) 90/57  (!) 106/42  Pulse: 67 71 70 64  Resp: 17 17  19   Temp: 98 F (36.7 C) 98.7 F (37.1 C)  98.4 F (36.9 C)  TempSrc: Oral Oral  Oral  SpO2: 97% 93% (!) 74% 94%  Weight:      Height:        Intake/Output Summary (Last 24 hours) at 02/06/2024 1751 Last data filed at 02/06/2024 0542 Gross per 24 hour  Intake 1030 ml  Output --  Net 1030 ml   Filed Weights   02/03/24 1713 02/04/24 0757  Weight: 53 kg 52.6 kg    Physical Exam  Gen:- Awake Alert,  in no apparent distress  HEENT:- [employer].AT, No sclera icterus Nose- [place] 3L/min Neck-Supple Neck,No JVD,.  Lungs-somewhat diminished on the left, few scattered wheezes noted  CV- S1, S2 normal, regular  Abd-  +ve B.Sounds, Abd Soft, No tenderness,    Extremity/Skin:- No  edema, pedal pulses present  Psych-affect is appropriate, oriented x3 Neuro-generalized weakness, no new  focal deficits, no tremors MSK-left hip postoperative wound is clean dry and intact  Data Reviewed: I have personally reviewed following labs and imaging studies  CBC: Recent Labs  Lab 02/03/24 1731 02/04/24 0340 02/05/24 0423 02/06/24 0258  WBC 31.5* 19.0* 15.6* 17.1*  NEUTROABS 28.4*  --   --   --   HGB 12.5 11.2* 8.7* 8.7*  HCT 38.0 34.8* 27.9* 27.1*  MCV 104.4* 104.8* 108.1* 107.5*  PLT 328 249 165 169   Basic Metabolic Panel: Recent Labs  Lab 02/03/24 1731 02/04/24 0340  NA 136 138  K 3.4* 4.1  CL 95* 100  CO2 25 28  GLUCOSE 218* 113*  BUN 19 23  CREATININE 0.93 0.97  CALCIUM 9.0  8.4*   GFR: Estimated Creatinine Clearance: 37.8 mL/min (by C-G formula based on SCr of 0.97 mg/dL).  Radiology Studies: DG CHEST PORT 1 VIEW Result Date: 02/06/2024 CLINICAL DATA:  [id number].  Dyspnea and respiratory abnormalities. EXAM: PORTABLE CHEST 1 VIEW COMPARISON:  Portable chest 02/03/2024. FINDINGS: Today there is increasing streaky retrocardiac left lower lobe opacity which could be increased atelectasis or pneumonia. Rest of the lungs demonstrate mild chronic changes including COPD without focal opacity. No pleural effusion is seen. There is cardiomegaly without evidence of CHF with stable left chest dual lead pacing system and wire insertions. The aorta is tortuous and calcified with stable mediastinum. No new osseous abnormality. IMPRESSION: 1. Increasing streaky retrocardiac left lower lobe opacity which could be increased atelectasis or pneumonia. 2. COPD. 3. Cardiomegaly without evidence of CHF. 4. Aortic atherosclerosis. The Electronically Signed   By: Francis Quam M.D.   On: 02/06/2024 06:19   Scheduled Meds:  dextromethorphan -guaiFENesin   1 tablet Oral BID   docusate sodium   100 mg Oral BID   enoxaparin  (LOVENOX ) injection  40 mg Subcutaneous Q24H   famotidine   40 mg Oral BID   fluticasone  furoate-vilanterol  1 puff Inhalation Daily   gabapentin   400 mg Oral Daily    gabapentin   800 mg Oral QHS   ipratropium-albuterol   3 mL Nebulization TID   levothyroxine   25 mcg Oral Q0600   methylPREDNISolone  (SOLU-MEDROL ) injection  40 mg Intravenous Q12H   sertraline   50 mg Oral Daily   tiZANidine   2 mg Oral BID   Continuous Infusions:  azithromycin  Stopped (02/06/24 0645)   cefTRIAXone  (ROCEPHIN )  IV Stopped (02/06/24 0530)    LOS: 3 days   Rendall Carwin M.D on 02/06/2024 at 5:51 PM  Go to [email or web address] - for contact info  [facility] - Office  [phone]  If 7PM-7AM, please contact night-coverage [email or web address] 02/06/2024, 5:51 PM

## 2024-02-07 ENCOUNTER — Encounter (HOSPITAL_COMMUNITY): Payer: Self-pay | Admitting: Orthopedic Surgery

## 2024-02-07 DIAGNOSIS — S72002A Fracture of unspecified part of neck of left femur, initial encounter for closed fracture: Secondary | ICD-10-CM | POA: Diagnosis not present

## 2024-02-07 MED ORDER — IPRATROPIUM-ALBUTEROL 0.5-2.5 (3) MG/3ML IN SOLN
3.0000 mL | Freq: Two times a day (BID) | RESPIRATORY_TRACT | Status: DC
  Administered 2024-02-07 – 2024-02-08 (×3): 3 mL via RESPIRATORY_TRACT
  Filled 2024-02-07 (×3): qty 3

## 2024-02-07 MED ORDER — ACETAMINOPHEN 325 MG PO TABS
650.0000 mg | ORAL_TABLET | Freq: Three times a day (TID) | ORAL | Status: DC
  Administered 2024-02-07 – 2024-02-08 (×3): 650 mg via ORAL
  Filled 2024-02-07 (×3): qty 2

## 2024-02-07 NOTE — Progress Notes (Addendum)
 SATURATION QUALIFICATIONS: (This note is used to comply with regulatory documentation for home oxygen )   Patient Saturations on Room Air at Rest = 85%   Patient Saturations on Room Air while Ambulating = 82%   Patient Saturations on 2 Liters of oxygen  while Ambulating = 93%    Patient needs continuous O2 at 2 L/min continuously via nasal cannula with humidifier, with gaseous portability and conserving device     Please briefly explain why patient needs home oxygen :Pt unable to ambulate without use of O2.  Dx--COPD  Rendall Carwin, MD

## 2024-02-07 NOTE — Progress Notes (Signed)
 SATURATION QUALIFICATIONS: (This note is used to comply with regulatory documentation for home oxygen )  Patient Saturations on Room Air at Rest = 85%  Patient Saturations on Room Air while Ambulating = 82%  Patient Saturations on 2 Liters of oxygen  while Ambulating = 93%  Please briefly explain why patient needs home oxygen :Pt unable to ambulate without use of O2.

## 2024-02-07 NOTE — Progress Notes (Signed)
 Mobility Specialist Progress Note:    02/07/24 0946  Mobility  Activity Ambulated with assistance in room  Level of Assistance Contact guard assist, steadying assist  Assistive Device Front wheel walker  Distance Ambulated (ft) 10 ft  Range of Motion/Exercises Active;All extremities  LLE Weight Bearing Per Provider Order WBAT  Activity Response Tolerated well  Mobility visit 1 Mobility  Mobility Specialist Start Time (ACUTE ONLY) 0915  Mobility Specialist Stop Time (ACUTE ONLY) 0946  Mobility Specialist Time Calculation (min) (ACUTE ONLY) 31 min   Pt received in bed, husband in room. Agreeable to mobility, required CGA to stand and ambulate with RW. Tolerated well, see O2 sats below. Pt c/o SOB after session. Pt eager to attempt session independently, experiencing some LOB while standing. Returned supine, alarm on. All needs met.  SpO2 85% on RA at rest SpO2 82% on RA sitting EOB SpO2 90-93% on 2L during ambulation  Sherrilee Ditty Mobility Specialist Please contact via SecureChat or  Rehab office at [phone]

## 2024-02-07 NOTE — Progress Notes (Signed)
 PROGRESS NOTE     Rether Rison, is a 78 y.o. female, DOB - 31-Mar-1946, FMW:[id number]  Admit date - 02/03/2024   Admitting Physician Ejiroghene FORBES Carwin, MD  Outpatient Primary MD for the patient is Orpha Yancey LABOR, MD  LOS - 4  Chief Complaint  Patient presents with   Hip Injury    left      Brief Narrative:  78 y.o. female with medical history significant for COPD, hypertension, tobacco abuse, SSS admitted on 02/03/2024 with left femoral neck fracture after mechanical fall at home (tripped over a toy) --- Patient Now has  acute hypoxic respiratory failure   -Assessment and Plan: 1)Left Displaced Femoral Neck Fracture  Status post mechanical fall. Mildly displaced and impacted subcapital fracture of left femoral neck. S/p ORIF by Dr. Margrette on 02/04/2024 -Postop plan  Weightbearing as tolerated Direct lateral hip precautions DVT prophylaxis for 30 days--- Lovenox  while in the hospital, consider Xarelto  10 mg daily for DVT prophylaxis at discharge Remove staples at 12 to 14 days Postop appointment scheduled for 28 days -- prn Hydrocodone  and scheduled Zanaflex  as ordered   2) acute hypoxic respiratory failure due to left-sided pneumonia Lt Sided CAP --POA  --- WBC 31.5 >>19.0>>15.6>>17.1 -Steroids  may perpetuate leukocytosis,  - No recent WBC available since 2021 to compare -Chest x-ray from 02/06/2024 suggest left-sided pneumonia --Continue Rocephin /azithromycin   - c/n IV steroids - Bronchodilators, mucolytics and supplemental oxygen  as ordered - Currently requiring 2 to 3 L of oxygen  via nasal cannula - Was Not on home O2 prior to admission - 3) acute COPD exacerbation--due to left-sided pneumonia as above pain #2 - -PTA patient was on prednisone  10 mg daily -c/n  IV Solu-Medrol   .-Continue bronchodilators, mucolytic's and oxygen  - Repeat Chest x-ray on 02/08/2024  4)Hypothyroidism----stable continue atorvastatin  5)GERD--- stable, continue  Pepcid   6)Depression/Anxiety--stable, continue Zoloft  and use Xanax   7) acute on chronic anemia--patient has chronic anemia dating back to 2018 - Suspect some perioperative blood loss from blood loss due to hip fracture compounded by hemodilution from IV fluids - Hgb currently above 8  -no indication for transfusion at this time  Status is: Inpatient   Disposition: The patient is from: Home              Anticipated d/c is to: SNF              Anticipated d/c date is: 1 day              Patient currently is not medically stable to d/c. Barriers: Not Clinically Stable-   Code Status :  -  Code Status: Full Code   Family Communication:     (patient is alert, awake and coherent)  - Husband at bedside, questions answered  DVT Prophylaxis  :   - SCDs  enoxaparin  (LOVENOX ) injection 40 mg Start: 02/05/24 1000 SCDs Start: 02/04/24 1610 Place TED hose Start: 02/04/24 1610 SCDs Start: 02/03/24 2254   Lab Results  Component Value Date   PLT 169 02/06/2024   Inpatient Medications  Scheduled Meds:  dextromethorphan -guaiFENesin   1 tablet Oral BID   docusate sodium   100 mg Oral BID   enoxaparin  (LOVENOX ) injection  40 mg Subcutaneous Q24H   famotidine   40 mg Oral BID   fluticasone  furoate-vilanterol  1 puff Inhalation Daily   gabapentin   400 mg Oral Daily   gabapentin   800 mg Oral QHS   ipratropium-albuterol   3 mL Nebulization BID   levothyroxine   25 mcg  Oral Q0600   methylPREDNISolone  (SOLU-MEDROL ) injection  40 mg Intravenous Q12H   sertraline   50 mg Oral Daily   tiZANidine   2 mg Oral BID   Continuous Infusions:  azithromycin  500 mg (02/07/24 0617)   cefTRIAXone  (ROCEPHIN )  IV 2 g (02/07/24 0538)   PRN Meds:.acetaminophen  **OR** acetaminophen , acetaminophen , albuterol , ALPRAZolam , HYDROcodone -acetaminophen , HYDROcodone -acetaminophen , menthol -cetylpyridinium **OR** phenol, methocarbamol  **OR** methocarbamol  (ROBAXIN ) injection, metoCLOPramide  **OR** metoCLOPramide  (REGLAN )  injection, morphine  injection, morphine  injection, ondansetron  **OR** ondansetron  (ZOFRAN ) IV, polyethylene glycol   Anti-infectives (From admission, onward)    Start     Dose/Rate Route Frequency Ordered Stop   02/04/24 1800  ceFAZolin  (ANCEF ) IVPB 2g/100 mL premix        2 g 200 mL/hr over 30 Minutes Intravenous Every 6 hours 02/04/24 1609 02/05/24 0032   02/04/24 1100  ceFAZolin  (ANCEF ) IVPB 2g/100 mL premix        2 g 200 mL/hr over 30 Minutes Intravenous On call to O.R. 02/04/24 1054 02/04/24 1232   02/03/24 2300  cefTRIAXone  (ROCEPHIN ) 2 g in sodium chloride  0.9 % 100 mL IVPB        2 g 200 mL/hr over 30 Minutes Intravenous Every 24 hours 02/03/24 2254     02/03/24 2300  azithromycin  (ZITHROMAX ) 500 mg in sodium chloride  0.9 % 250 mL IVPB        500 mg 250 mL/hr over 60 Minutes Intravenous Every 24 hours 02/03/24 2254        Subjective: Roselind Hopping today has no fevers, no emesis,  No chest pain,   - - Ambulating with mobility specialist desaturated--- Patient Saturations on Room Air at Rest = 85%  Patient Saturations on Room Air while Ambulating = 82%  Patient Saturations on 2 Liters of oxygen  while Ambulating = 93% - Dyspnea and cough persist -Husband at bedside, questions answered  Objective: Vitals:   02/06/24 2103 02/07/24 0420 02/07/24 0809 02/07/24 0812  BP: (!) 137/58 133/62    Pulse: 76 69    Resp: 20 17    Temp: 98.5 F (36.9 C) 98.3 F (36.8 C)    TempSrc: Oral Oral    SpO2: 99% 100% 96% 100%  Weight:      Height:        Intake/Output Summary (Last 24 hours) at 02/07/2024 1258 Last data filed at 02/06/2024 2100 Gross per 24 hour  Intake --  Output 1000 ml  Net -1000 ml   Filed Weights   02/03/24 1713 02/04/24 0757  Weight: 53 kg 52.6 kg    Physical Exam  Gen:- Awake Alert,  in no apparent distress  HEENT:- [place].AT, No sclera icterus Nose- [place] 3L/min Neck-Supple Neck,No JVD,.  Lungs-somewhat diminished on the left, few scattered wheezes  noted  CV- S1, S2 normal, regular  Abd-  +ve B.Sounds, Abd Soft, No tenderness,    Extremity/Skin:- No  edema, pedal pulses present  Psych-affect is appropriate, oriented x3 Neuro-generalized weakness, no new focal deficits, no tremors MSK-left hip postoperative wound is clean dry and intact  Data Reviewed: I have personally reviewed following labs and imaging studies  CBC: Recent Labs  Lab 02/03/24 1731 02/04/24 0340 02/05/24 0423 02/06/24 0258  WBC 31.5* 19.0* 15.6* 17.1*  NEUTROABS 28.4*  --   --   --   HGB 12.5 11.2* 8.7* 8.7*  HCT 38.0 34.8* 27.9* 27.1*  MCV 104.4* 104.8* 108.1* 107.5*  PLT 328 249 165 169   Basic Metabolic Panel: Recent Labs  Lab 02/03/24 1731 02/04/24 0340  NA  136 138  K 3.4* 4.1  CL 95* 100  CO2 25 28  GLUCOSE 218* 113*  BUN 19 23  CREATININE 0.93 0.97  CALCIUM 9.0 8.4*   GFR: Estimated Creatinine Clearance: 37.8 mL/min (by C-G formula based on SCr of 0.97 mg/dL).  Radiology Studies: DG CHEST PORT 1 VIEW Result Date: 02/06/2024 CLINICAL DATA:  [id number].  Dyspnea and respiratory abnormalities. EXAM: PORTABLE CHEST 1 VIEW COMPARISON:  Portable chest 02/03/2024. FINDINGS: Today there is increasing streaky retrocardiac left lower lobe opacity which could be increased atelectasis or pneumonia. Rest of the lungs demonstrate mild chronic changes including COPD without focal opacity. No pleural effusion is seen. There is cardiomegaly without evidence of CHF with stable left chest dual lead pacing system and wire insertions. The aorta is tortuous and calcified with stable mediastinum. No new osseous abnormality. IMPRESSION: 1. Increasing streaky retrocardiac left lower lobe opacity which could be increased atelectasis or pneumonia. 2. COPD. 3. Cardiomegaly without evidence of CHF. 4. Aortic atherosclerosis. The Electronically Signed   By: Francis Quam M.D.   On: 02/06/2024 06:19   Scheduled Meds:  dextromethorphan -guaiFENesin   1 tablet Oral BID    docusate sodium   100 mg Oral BID   enoxaparin  (LOVENOX ) injection  40 mg Subcutaneous Q24H   famotidine   40 mg Oral BID   fluticasone  furoate-vilanterol  1 puff Inhalation Daily   gabapentin   400 mg Oral Daily   gabapentin   800 mg Oral QHS   ipratropium-albuterol   3 mL Nebulization BID   levothyroxine   25 mcg Oral Q0600   methylPREDNISolone  (SOLU-MEDROL ) injection  40 mg Intravenous Q12H   sertraline   50 mg Oral Daily   tiZANidine   2 mg Oral BID   Continuous Infusions:  azithromycin  500 mg (02/07/24 0617)   cefTRIAXone  (ROCEPHIN )  IV 2 g (02/07/24 0538)    LOS: 4 days   Rendall Carwin M.D on 02/07/2024 at 12:58 PM  Go to [email or web address] - for contact info  [facility] - Office  [phone]  If 7PM-7AM, please contact night-coverage [email or web address] 02/07/2024, 12:58 PM

## 2024-02-07 NOTE — TOC Transition Note (Signed)
 Transition of Care [facility]) - Discharge Note   Patient Details  Name: [name] MRN: [id number] Date of Birth: 1946/02/23  Transition of [facility]) CM/SW Contact:  Hoy DELENA Bigness, LCSW Phone Number: 02/07/2024, 11:51 AM   Clinical Narrative:    Pt declining SNF placement. Pt agreeable to HHPT/OT being arranged and does not have an agency preference. HHPT/OT has been arranged with [facility]. Pt also with new O2 requirement. Pt shares she has had home O2 in the past during COVID-19 but, no longer has it and does not know which agency it was provided through. Pt does not have DME agency preference. Home O2 has been arranged through [employer]. [employer] to deliver travel O2 to pt's room prior to DC. Pt's spouse to provide transportation.    Final next level of care: Home w Home Health Services (Pt will to son and dtr-in-law's home. [place] and [address] [place], [place]  [phone].) Barriers to Discharge: Barriers Resolved   Patient Goals and CMS Choice Patient states their goals for this hospitalization and ongoing recovery are:: Return home CMS Medicare.gov Compare Post Acute Care list provided to:: Patient Choice offered to / list presented to : Patient [place] ownership interest in [facility].provided to:: Patient    Discharge Placement                       Discharge Plan and Services Additional resources added to the After Visit Summary for   In-house Referral: Clinical Social Work Discharge Planning Services: CM Consult            DME Arranged: Oxygen  DME Agency: [employer] Date DME Agency Contacted: 02/07/24 Time DME Agency Contacted: 1147 Representative spoke with at DME Agency: Mitch HH Arranged: PT, OT HH Agency: [facility] Date [facility] Agency Contacted: 02/07/24 Time HH Agency Contacted: 1148 Representative spoke with at [facility] Agency: Cindie  Social Drivers of Health (SDOH) Interventions SDOH Screenings   Food Insecurity:  Patient Declined (02/04/2024)  Housing: Patient Declined (02/04/2024)  Transportation Needs: Patient Declined (02/04/2024)  Utilities: Patient Declined (02/04/2024)  Social Connections: Patient Declined (02/04/2024)  Tobacco Use: Medium Risk (02/04/2024)  Health Literacy: High Risk (11/26/2020)   Received from [facility]     Readmission Risk Interventions    02/07/2024   11:46 AM 02/05/2024   11:21 AM 02/04/2024   11:01 AM  Readmission Risk Prevention Plan  Post Dischage Appt   Complete  Medication Screening   Complete  Transportation Screening Complete Complete Complete  PCP or Specialist Appt within 5-7 Days Complete Complete   Home Care Screening Complete Complete   Medication Review (RN CM) Complete Complete

## 2024-02-07 NOTE — Plan of Care (Signed)
  Problem: Education: Goal: Knowledge of General Education information will improve Description: Including pain rating scale, medication(s)/side effects and non-pharmacologic comfort measures Outcome: Progressing   Problem: Health Behavior/Discharge Planning: Goal: Ability to manage health-related needs will improve Outcome: Progressing   Problem: Coping: Goal: Level of anxiety will decrease Outcome: Progressing   Problem: Elimination: Goal: Will not experience complications related to bowel motility Outcome: Progressing Goal: Will not experience complications related to urinary retention Outcome: Progressing

## 2024-02-07 NOTE — Plan of Care (Signed)

## 2024-02-08 ENCOUNTER — Inpatient Hospital Stay (HOSPITAL_COMMUNITY)

## 2024-02-08 DIAGNOSIS — S72002A Fracture of unspecified part of neck of left femur, initial encounter for closed fracture: Secondary | ICD-10-CM | POA: Diagnosis not present

## 2024-02-08 DIAGNOSIS — D72829 Elevated white blood cell count, unspecified: Secondary | ICD-10-CM | POA: Diagnosis not present

## 2024-02-08 DIAGNOSIS — J449 Chronic obstructive pulmonary disease, unspecified: Secondary | ICD-10-CM | POA: Diagnosis not present

## 2024-02-08 DIAGNOSIS — J690 Pneumonitis due to inhalation of food and vomit: Secondary | ICD-10-CM | POA: Diagnosis not present

## 2024-02-08 LAB — RENAL FUNCTION PANEL
Albumin: 2.3 g/dL — ABNORMAL LOW (ref 3.5–5.0)
Anion gap: 8 (ref 5–15)
BUN: 33 mg/dL — ABNORMAL HIGH (ref 8–23)
CO2: 26 mmol/L (ref 22–32)
Calcium: 7.5 mg/dL — ABNORMAL LOW (ref 8.9–10.3)
Chloride: 99 mmol/L (ref 98–111)
Creatinine, Ser: 0.9 mg/dL (ref 0.44–1.00)
GFR, Estimated: >60 mL/min (ref >60)
Glucose, Bld: 182 mg/dL — ABNORMAL HIGH (ref 70–99)
Phosphorus: 2.6 mg/dL (ref 2.5–4.6)
Potassium: 4.8 mmol/L (ref 3.5–5.1)
Sodium: 133 mmol/L — ABNORMAL LOW (ref 135–145)

## 2024-02-08 MED ORDER — PREDNISONE 10 MG PO TABS
ORAL_TABLET | ORAL | 0 refills | Status: DC
Start: 2024-02-08 — End: 2024-03-30

## 2024-02-08 MED ORDER — POLYETHYLENE GLYCOL 3350 17 G PO PACK: 17.0000 g | PACK | Freq: Every day | ORAL | 0 refills | Status: AC | PRN

## 2024-02-08 MED ORDER — AMOXICILLIN-POT CLAVULANATE 875-125 MG PO TABS
1.0000 | ORAL_TABLET | Freq: Two times a day (BID) | ORAL | 0 refills | Status: AC
Start: 2024-02-08 — End: 2024-02-13

## 2024-02-08 MED ORDER — HYDROCODONE-ACETAMINOPHEN 7.5-325 MG PO TABS: 1.0000 | ORAL_TABLET | Freq: Four times a day (QID) | ORAL | 0 refills | Status: AC | PRN

## 2024-02-08 MED ORDER — DOCUSATE SODIUM 100 MG PO CAPS: 100.0000 mg | ORAL_CAPSULE | Freq: Two times a day (BID) | ORAL | 0 refills | Status: AC

## 2024-02-08 MED ORDER — DM-GUAIFENESIN ER 30-600 MG PO TB12: 1.0000 | ORAL_TABLET | Freq: Two times a day (BID) | ORAL | 0 refills | Status: AC

## 2024-02-08 MED ORDER — RIVAROXABAN 10 MG PO TABS
10.0000 mg | ORAL_TABLET | Freq: Every day | ORAL | 0 refills | Status: AC
Start: 2024-02-08 — End: 2024-06-14

## 2024-02-08 NOTE — TOC Transition Note (Signed)
 Transition of Care [facility]) - Discharge Note   Patient Details  Name: [name] MRN: [id number] Date of Birth: November 14, 1945  Transition of Care [facility]) CM/SW Contact:  Sharlyne Stabs, RN Phone Number: 02/08/2024, 10:46 AM   Clinical Narrative:   Discharging home today with [facility] health, [facility] updated.  Home Oxygen  arranged with [employer].     Final next level of care: Home w Home Health Services Barriers to Discharge: Barriers Resolved   Patient Goals and CMS Choice Patient states their goals for this hospitalization and ongoing recovery are:: Return home CMS Medicare.gov Compare Post Acute Care list provided to:: Patient Choice offered to / list presented to : Patient [place] ownership interest in [facility].provided to:: Patient    Discharge Placement                       Discharge Plan and Services Additional resources added to the After Visit Summary for   In-house Referral: Clinical Social Work Discharge Planning Services: CM Consult            DME Arranged: Oxygen  DME Agency: [employer] Date DME Agency Contacted: 02/07/24 Time DME Agency Contacted: 1147 Representative spoke with at DME Agency: Mitch HH Arranged: PT, OT HH Agency: [facility] Date [facility] Agency Contacted: 02/07/24 Time HH Agency Contacted: 1148 Representative spoke with at [facility] Agency: Cindie  Social Drivers of Health (SDOH) Interventions SDOH Screenings   Food Insecurity: Patient Declined (02/04/2024)  Housing: Patient Declined (02/04/2024)  Transportation Needs: Patient Declined (02/04/2024)  Utilities: Patient Declined (02/04/2024)  Social Connections: Patient Declined (02/04/2024)  Tobacco Use: Medium Risk (02/04/2024)  Health Literacy: High Risk (11/26/2020)   Received from [facility]     Readmission Risk Interventions    02/07/2024   11:46 AM 02/05/2024   11:21 AM 02/04/2024   11:01 AM  Readmission Risk Prevention Plan  Post Dischage Appt   Complete   Medication Screening   Complete  Transportation Screening Complete Complete Complete  PCP or Specialist Appt within 5-7 Days Complete Complete   Home Care Screening Complete Complete   Medication Review (RN CM) Complete Complete

## 2024-02-08 NOTE — Plan of Care (Signed)
   Problem: Activity: Goal: Risk for activity intolerance will decrease Outcome: Progressing

## 2024-02-08 NOTE — Discharge Summary (Signed)
 Physician Discharge Summary   Patient: [name] MRN: [id number] DOB: June 20, 1946  Admit date:     02/03/2024  Discharge date: 02/08/24  Discharge Physician: Eric Nunnery   PCP: Orpha Yancey LABOR, MD   Recommendations at discharge:  Repeat CBC to follow hemoglobin trend/stability Repeat basic metabolic panel to follow renal function and electrolytes Reassess chest x-ray in 6-8 weeks to assure resolution of infiltrates Continue to wean off oxygen  supplementation as tolerated. Make sure patient follow-up with orthopedic service as instructed.  Discharge Diagnoses: Principal Problem:   Left displaced femoral neck fracture (HCC) Active Problems:   Leukocytosis   Pacemaker-St.Jude   COPD (chronic obstructive pulmonary disease) (HCC)   Aspiration pneumonia [facility])    Brief Hospital admission narrative: As per H&P written by Dr. Pearlean on 02/03/2024 Ader Fritze is a 78 y.o. female with medical history significant for COPD, hypertension, tobacco abuse, SSS. Patient presented to the ED with complaints of fall.  She reports about 3 falls in the past 2 weeks.  She reports feeling weak when she gets up in the morning.  She was alone with her 76-year-old grandchild who was watching when she fell onto her left hip.  She believes she tripped over a toy. No dizziness no chest pain no difficulty breathing.  Reports good oral intake.  No vomiting or diarrhea.  No urinary symptoms.  No headache or neck pain.   ED Course: Temperature 98.3.  Heart rate 77-89.  Respirate rate 13-27.  Blood pressure systolic 169-195.  O2 sats 97% on room air. Leukocytosis of 31.5.  Potassium 3.4. X-ray of the hip shows mildly displaced and impacted subcapital fracture of the left femoral neck. Chest x-ray shows *Mildly increased interstitial markings throughout bilateral lungs, which is nonspecific. Differential diagnosis includes interstitial lung disease, mild pulmonary edema, atypical pneumonia, etc. No dense  consolidation or lung collapse.  Assessment and Plan: 1)Left Displaced Femoral Neck Fracture  Status post mechanical fall. Mildly displaced and impacted subcapital fracture of left femoral neck. -S/p ORIF by Dr. Margrette on 02/04/2024 -Postop plan -Direct lateral hip precautions -Weightbearing as tolerated and outpatient follow-up with orthopedic service as instructed. -Remove staples at 12 to 14 days -28 days of DVT prophylaxis using Xarelto  10 mg daily has been prescribed. -Continue as needed analgesics.   2) acute hypoxic respiratory failure due to left-sided pneumonia Lt Sided CAP --POA  --- WBC 31.5 >>19.0>>15.6>>17.1 -Steroids  may perpetuate leukocytosis,  - No recent WBC available since 2021 to compare -Chest x-ray from 02/06/2024 suggest left-sided pneumonia (questionable aspiration). -- Patient has been discharged on oral antibiotics to complete therapy. - Continue oral antibiotics, as needed mucolytic's, flutter valve and the use of his steroids. -Resume home bronchodilator management. - Discharged on 2 L nasal cannula supplementation required.  3) acute COPD exacerbation--due to left-sided pneumonia as above pain #2 - -PTA patient was on prednisone  10 mg daily - Continue steroids tapering .-Continue bronchodilators, mucolytic's and oxygen  - Repeat Chest x-ray for-6 weeks after antibiotic completion to assure resolution of infiltrates.   4)Hypothyroidism/hyperlipidemia---- -stable  - Continue Synthroid  and statin   5)GERD--- stable,  - Continue the use of Pepcid  and PPI.   6)Depression/Anxiety--stable, continue Zoloft  and use Xanax    7) acute on chronic anemia--patient has chronic anemia dating back to 2018 - Suspect some perioperative blood loss from blood loss due to hip fracture compounded by hemodilution from IV fluids - Hgb currently above 8  -no indication for transfusion at this time -Continue iron supplementation. -Repeat CBC  at follow-up visit to assess  hemoglobin stability and trend.  Consultants: Orthopedic service Procedures performed: See below for x-ray reports. Disposition: Home with home health services. Diet recommendation: Heart healthy/low-sodium diet.  DISCHARGE MEDICATION: Allergies as of 02/08/2024   No Known Allergies      Medication List     TAKE these medications    albuterol  108 (90 Base) MCG/ACT inhaler Commonly known as: VENTOLIN  HFA Inhale 2 puffs into the lungs every 6 (six) hours as needed for wheezing or shortness of breath.   ALPRAZolam  1 MG tablet Commonly known as: XANAX  Take 1 mg by mouth 2 (two) times daily.   amoxicillin -clavulanate 875-125 MG tablet Commonly known as: AUGMENTIN  Take 1 tablet by mouth 2 (two) times daily for 5 days.   b complex vitamins tablet Take 1 tablet by mouth daily.   Calcium 200 MG Tabs Take 1 tablet by mouth daily.   dextromethorphan -guaiFENesin  30-600 MG 12hr tablet Commonly known as: MUCINEX  DM Take 1 tablet by mouth 2 (two) times daily for 7 days.   Dialyvite Vitamin D 5000 125 MCG (5000 UT) capsule Generic drug: Cholecalciferol Take 5,000 Units by mouth daily.   diphenhydrAMINE  25 mg capsule Commonly known as: BENADRYL  Take 25 mg by mouth every 6 (six) hours as needed for allergies.   docusate sodium  100 MG capsule Commonly known as: COLACE Take 1 capsule (100 mg total) by mouth 2 (two) times daily.   famotidine  40 MG tablet Commonly known as: PEPCID  Take 40 mg by mouth 2 (two) times daily.   Fish Oil 1000 MG Caps Take 1,000 mg by mouth 3 (three) times daily.   fluticasone -salmeterol 250-50 MCG/ACT Aepb Commonly known as: ADVAIR Inhale 1 puff into the lungs 2 (two) times daily.   folic acid 1 MG tablet Commonly known as: FOLVITE Take 1 mg by mouth daily.   gabapentin  400 MG capsule Commonly known as: NEURONTIN  Take 400-800 mg by mouth See admin instructions. Take 1 capsule (400 mg) by mouth in the morning & take 2 capsules (800 mg) by mouth  at night.   HYDROcodone -acetaminophen  7.5-325 MG tablet Commonly known as: NORCO Take 1-2 tablets by mouth every 6 (six) hours as needed for up to 7 days for severe pain (pain score 7-10).   ipratropium-albuterol  0.5-2.5 (3) MG/3ML Soln Commonly known as: DUONEB Take 3 mLs by nebulization every 4 (four) hours as needed (shortness of breath).   levothyroxine  25 MCG tablet Commonly known as: SYNTHROID  Take 25 mcg by mouth every morning.   polyethylene glycol 17 g packet Commonly known as: MIRALAX  / GLYCOLAX  Take 17 g by mouth daily as needed for mild constipation.   predniSONE  10 MG tablet Commonly known as: DELTASONE  Take 4 tablets by mouth daily x 1 day; then 3 tablets by mouth daily x 2 days; then 2 tablets by mouth daily x 3 days and then resume daily oral prednisone . What changed:  how much to take how to take this when to take this additional instructions   rivaroxaban  10 MG Tabs tablet Commonly known as: XARELTO  Take 1 tablet (10 mg total) by mouth daily for 28 days.   sertraline  50 MG tablet Commonly known as: ZOLOFT  Take 50 mg by mouth daily.   tiZANidine  2 MG tablet Commonly known as: ZANAFLEX  Take 2 mg by mouth 2 (two) times daily.   Vitamin A 2400 MCG (8000 UT) Caps Take 2,400 mcg by mouth daily.   vitamin B-12 50 MCG tablet Commonly known as: CYANOCOBALAMIN Take 50  mcg by mouth daily.   vitamin C 1000 MG tablet Take 1,000 mg by mouth daily.   VITAMIN E PO Take 1 capsule by mouth daily.               Durable Medical Equipment  (From admission, onward)           Start     Ordered   02/07/24 1352  For home use only DME oxygen   Once       Comments: SATURATION QUALIFICATIONS: (This note is used to comply with regulatory documentation for home oxygen )   Patient Saturations on Room Air at Rest = 85%   Patient Saturations on Room Air while Ambulating = 82%   Patient Saturations on 2 Liters of oxygen  while Ambulating = 93%    Patient  needs continuous O2 at 2 L/min continuously via nasal cannula with humidifier, with gaseous portability and conserving device     Please briefly explain why patient needs home oxygen :Pt unable to ambulate without use of O2.   Dx--COPD  Question Answer Comment  Length of Need Lifetime   Mode or (Route) Nasal cannula   Liters per Minute 2   Frequency Continuous (stationary and portable oxygen  unit needed)   Oxygen  conserving device Yes   Oxygen  delivery system Gas      02/07/24 1352              Discharge Care Instructions  (From admission, onward)           Start     Ordered   02/08/24 0000  Discharge wound care:       Comments: Keep area clean and dry; reinforce dressing as needed and keep in place until follow-up with orthopedic service.   02/08/24 1428            Follow-up Information     [facility], [facility] Follow up.   Specialty: Home Health Services Why: Hedda will follow up with you to provide home health services Contact information: [address] [place] [address] [phone]         [facility] Follow up.   Why: Home Oxygen         Hasanaj, Yancey LABOR, MD. Schedule an appointment as soon as possible for a visit in 10 day(s).   Specialty: Internal Medicine Contact information: [address] [place] [address] [phone]                Discharge Exam: Filed Weights   02/03/24 1713 02/04/24 0757  Weight: 53 kg 52.6 kg   General exam: Alert, awake, oriented x 3; in no acute distress.  Feeling ready to go home. Respiratory system: No using accessory muscles; good saturation on 2 L supplementation. Cardiovascular system:RRR. No rubs or gallops; no JVD. Gastrointestinal system: Abdomen is nondistended, soft and nontender. No organomegaly or masses felt. Normal bowel sounds heard. Central nervous system:No focal neurological deficits. Extremities: No cyanosis or clubbing.  Clean dressings in place;  overall controlled pain. Skin: No petechiae. Psychiatry: Judgement and insight appear normal. Mood & affect appropriate.    Condition at discharge: Stable and improved.  The results of significant diagnostics from this hospitalization (including imaging, microbiology, ancillary and laboratory) are listed below for reference.   Imaging Studies: DG Chest 2 View Result Date: 02/08/2024 CLINICAL DATA:  [id number] Dyspnea and respiratory abnormalities [id number] EXAM: CHEST - 2 VIEW COMPARISON:  02/06/2024. FINDINGS: Redemonstration of increased interstitial markings which are nonspecific and may  be due to underlying COPD, chronic interstitial lung disease or pulmonary edema. No significant interval change. There are persistent left retrocardiac opacities which may represent combination of atelectasis and/or consolidation. Bilateral lung fields are otherwise clear. No acute consolidation or lung collapse for bilateral costophrenic angles are clear. Stable cardio-mediastinal silhouette. There is a left sided 2-lead pacemaker. No acute osseous abnormalities. The soft tissues are within normal limits. IMPRESSION: No significant interval change since the prior study. Persistent left retrocardiac opacities which may represent combination of atelectasis and/or consolidation. Electronically Signed   By: Ree Molt M.D.   On: 02/08/2024 08:17   DG CHEST PORT 1 VIEW Result Date: 02/06/2024 CLINICAL DATA:  [id number].  Dyspnea and respiratory abnormalities. EXAM: PORTABLE CHEST 1 VIEW COMPARISON:  Portable chest 02/03/2024. FINDINGS: Today there is increasing streaky retrocardiac left lower lobe opacity which could be increased atelectasis or pneumonia. Rest of the lungs demonstrate mild chronic changes including COPD without focal opacity. No pleural effusion is seen. There is cardiomegaly without evidence of CHF with stable left chest dual lead pacing system and wire insertions. The aorta is tortuous and calcified with  stable mediastinum. No new osseous abnormality. IMPRESSION: 1. Increasing streaky retrocardiac left lower lobe opacity which could be increased atelectasis or pneumonia. 2. COPD. 3. Cardiomegaly without evidence of CHF. 4. Aortic atherosclerosis. The Electronically Signed   By: Francis Quam M.D.   On: 02/06/2024 06:19   DG HIP UNILAT WITH PELVIS 2-3 VIEWS LEFT Result Date: 02/04/2024 CLINICAL DATA:  Status post hemiarthroplasty. EXAM: DG HIP (WITH OR WITHOUT PELVIS) 2-3V LEFT COMPARISON:  Preoperative imaging. FINDINGS: Left hip hemiarthroplasty in expected alignment. No periprosthetic lucency or fracture. Recent postsurgical change includes air and edema in the soft tissues. Overlying skin staples in place. IMPRESSION: Left hip hemiarthroplasty without immediate postoperative complication. Electronically Signed   By: Andrea Gasman M.D.   On: 02/04/2024 15:24   DG Hip Unilat With Pelvis 2-3 Views Left Result Date: 02/03/2024 CLINICAL DATA:  trauma, fall, short leg. EXAM: DG HIP (WITH OR WITHOUT PELVIS) 2-3V LEFT COMPARISON:  None Available. FINDINGS: Pelvis is intact with normal and symmetric sacroiliac joints. There is mildly displaced and impacted subcapital fracture of left femoral neck. No other acute fracture or dislocation. No aggressive osseous lesion. Visualized sacral arcuate lines are unremarkable. Unremarkable symphysis pubis. There are mild degenerative changes of bilateral hip joints without significant joint space narrowing. Osteophytosis of the superior acetabulum. No radiopaque foreign bodies. IMPRESSION: *Mildly displaced and impacted subcapital fracture of left femoral neck. Electronically Signed   By: Ree Molt M.D.   On: 02/03/2024 18:07   DG Chest Port 1 View Result Date: 02/03/2024 CLINICAL DATA:  trauma, fall, short leg. EXAM: PORTABLE CHEST 1 VIEW COMPARISON:  04/04/2009. FINDINGS: Note is made of elevated left hemidiaphragm. Low lung volume. There are mildly increased  interstitial markings throughout bilateral lungs, which is nonspecific. Differential diagnosis includes interstitial lung disease, mild pulmonary edema, atypical pneumonia, etc. No dense consolidation or lung collapse. Bilateral lateral costophrenic angles are clear. Normal cardio-mediastinal silhouette. There is a left sided 2-lead pacemaker. No acute osseous abnormalities. The soft tissues are within normal limits. There are surgical clips in the right upper quadrant, typical of a previous cholecystectomy. IMPRESSION: *Mildly increased interstitial markings throughout bilateral lungs, which is nonspecific. Differential diagnosis includes interstitial lung disease, mild pulmonary edema, atypical pneumonia, etc. No dense consolidation or lung collapse. Electronically Signed   By: Ree Molt M.D.   On: 02/03/2024 18:06  Microbiology: Results for orders placed or performed during the hospital encounter of 04/08/20  SARS CORONAVIRUS 2 (TAT 6-24 HRS) Nasopharyngeal Nasopharyngeal Swab     Status: None   Collection Time: 04/08/20  2:26 PM   Specimen: Nasopharyngeal Swab  Result Value Ref Range Status   SARS Coronavirus 2 NEGATIVE NEGATIVE Final    Comment: (NOTE) SARS-CoV-2 target nucleic acids are NOT DETECTED.  The SARS-CoV-2 RNA is generally detectable in upper and lower respiratory specimens during the acute phase of infection. Negative results do not preclude SARS-CoV-2 infection, do not rule out co-infections with other pathogens, and should not be used as the sole basis for treatment or other patient management decisions. Negative results must be combined with clinical observations, patient history, and epidemiological information. The expected result is Negative.  Fact Sheet for Patients: HairSlick.no  Fact Sheet for Healthcare Providers: quierodirigir.com  This test is not yet approved or cleared by the [place]  FDA and   has been authorized for detection and/or diagnosis of SARS-CoV-2 by FDA under an Emergency Use Authorization (EUA). This EUA will remain  in effect (meaning this test can be used) for the duration of the COVID-19 declaration under Se ction 564(b)(1) of the Act, 21 U.S.C. section 360bbb-3(b)(1), unless the authorization is terminated or revoked sooner.  Performed at [facility], [address] [place], [place] [address]     Labs: CBC: Recent Labs  Lab 02/03/24 1731 02/04/24 0340 02/05/24 0423 02/06/24 0258  WBC 31.5* 19.0* 15.6* 17.1*  NEUTROABS 28.4*  --   --   --   HGB 12.5 11.2* 8.7* 8.7*  HCT 38.0 34.8* 27.9* 27.1*  MCV 104.4* 104.8* 108.1* 107.5*  PLT 328 249 165 169   Basic Metabolic Panel: Recent Labs  Lab 02/03/24 1731 02/04/24 0340 02/08/24 0431  NA 136 138 133*  K 3.4* 4.1 4.8  CL 95* 100 99  CO2 25 28 26   GLUCOSE 218* 113* 182*  BUN 19 23 33*  CREATININE 0.93 0.97 0.90  CALCIUM 9.0 8.4* 7.5*  PHOS  --   --  2.6   Liver Function Tests: Recent Labs  Lab 02/08/24 0431  ALBUMIN 2.3*   CBG: No results for input(s): GLUCAP in the last 168 hours.  Discharge time spent:  38 minutes.  Signed: Eric Nunnery, MD [facility] 02/08/2024

## 2024-02-09 ENCOUNTER — Telehealth: Payer: Self-pay | Admitting: *Deleted

## 2024-02-09 NOTE — Transitions of Care (Post Inpatient/ED Visit) (Signed)
   02/09/2024  Name: [name] MRN: [id number] DOB: January 28, 1946  Today's TOC FU Call Status: Today's TOC FU Call Status:: Unsuccessful Call (1st Attempt) Unsuccessful Call (1st Attempt) Date: 02/09/24  Attempted to reach the patient regarding the most recent Inpatient/ED visit.  Follow Up Plan: Additional outreach attempts will be made to reach the patient to complete the Transitions of Care (Post Inpatient/ED visit) call.   Cathlean Headland BSN RN [place] Care Management Coordinator [email or web address] Direct Dial: [phone]  Fax: [phone] Website: [email or web address]

## 2024-02-10 ENCOUNTER — Telehealth: Payer: Self-pay

## 2024-02-10 DIAGNOSIS — J449 Chronic obstructive pulmonary disease, unspecified: Secondary | ICD-10-CM | POA: Diagnosis not present

## 2024-02-10 NOTE — Transitions of Care (Post Inpatient/ED Visit) (Signed)
   02/10/2024  Name: [name] MRN: [id number] DOB: 1945-11-26  Today's TOC FU Call Status: Today's TOC FU Call Status:: Unsuccessful Call (2nd Attempt) Unsuccessful Call (2nd Attempt) Date: 02/10/24  Attempted to reach the patient regarding the most recent Inpatient/ED visit.  Follow Up Plan: Additional outreach attempts will be made to reach the patient to complete the Transitions of Care (Post Inpatient/ED visit) call.   Kourtnie Sachs J. Codi Folkerts RN, MSN [facility], [facility] RN Care Manager Direct Dial: [phone]  Fax: [phone] Website: [email or web address]

## 2024-02-11 ENCOUNTER — Telehealth: Payer: Self-pay

## 2024-02-11 DIAGNOSIS — J9601 Acute respiratory failure with hypoxia: Secondary | ICD-10-CM | POA: Diagnosis not present

## 2024-02-11 DIAGNOSIS — L280 Lichen simplex chronicus: Secondary | ICD-10-CM | POA: Diagnosis not present

## 2024-02-11 DIAGNOSIS — Z950 Presence of cardiac pacemaker: Secondary | ICD-10-CM | POA: Diagnosis not present

## 2024-02-11 DIAGNOSIS — J441 Chronic obstructive pulmonary disease with (acute) exacerbation: Secondary | ICD-10-CM | POA: Diagnosis not present

## 2024-02-11 DIAGNOSIS — Z96642 Presence of left artificial hip joint: Secondary | ICD-10-CM | POA: Diagnosis not present

## 2024-02-11 DIAGNOSIS — J189 Pneumonia, unspecified organism: Secondary | ICD-10-CM | POA: Diagnosis not present

## 2024-02-11 DIAGNOSIS — I495 Sick sinus syndrome: Secondary | ICD-10-CM | POA: Diagnosis not present

## 2024-02-11 DIAGNOSIS — F32A Depression, unspecified: Secondary | ICD-10-CM | POA: Diagnosis not present

## 2024-02-11 DIAGNOSIS — Z7951 Long term (current) use of inhaled steroids: Secondary | ICD-10-CM | POA: Diagnosis not present

## 2024-02-11 DIAGNOSIS — S72012D Unspecified intracapsular fracture of left femur, subsequent encounter for closed fracture with routine healing: Secondary | ICD-10-CM | POA: Diagnosis not present

## 2024-02-11 DIAGNOSIS — Z87891 Personal history of nicotine dependence: Secondary | ICD-10-CM | POA: Diagnosis not present

## 2024-02-11 DIAGNOSIS — Z8711 Personal history of peptic ulcer disease: Secondary | ICD-10-CM | POA: Diagnosis not present

## 2024-02-11 DIAGNOSIS — D649 Anemia, unspecified: Secondary | ICD-10-CM | POA: Diagnosis not present

## 2024-02-11 DIAGNOSIS — J439 Emphysema, unspecified: Secondary | ICD-10-CM | POA: Diagnosis not present

## 2024-02-11 DIAGNOSIS — I119 Hypertensive heart disease without heart failure: Secondary | ICD-10-CM | POA: Diagnosis not present

## 2024-02-11 DIAGNOSIS — I080 Rheumatic disorders of both mitral and aortic valves: Secondary | ICD-10-CM | POA: Diagnosis not present

## 2024-02-11 DIAGNOSIS — E039 Hypothyroidism, unspecified: Secondary | ICD-10-CM | POA: Diagnosis not present

## 2024-02-11 DIAGNOSIS — I700 Atherosclerosis of aorta: Secondary | ICD-10-CM | POA: Diagnosis not present

## 2024-02-11 DIAGNOSIS — F419 Anxiety disorder, unspecified: Secondary | ICD-10-CM | POA: Diagnosis not present

## 2024-02-11 DIAGNOSIS — E876 Hypokalemia: Secondary | ICD-10-CM | POA: Diagnosis not present

## 2024-02-11 DIAGNOSIS — G8929 Other chronic pain: Secondary | ICD-10-CM | POA: Diagnosis not present

## 2024-02-11 DIAGNOSIS — K219 Gastro-esophageal reflux disease without esophagitis: Secondary | ICD-10-CM | POA: Diagnosis not present

## 2024-02-11 DIAGNOSIS — J690 Pneumonitis due to inhalation of food and vomit: Secondary | ICD-10-CM | POA: Diagnosis not present

## 2024-02-11 DIAGNOSIS — J440 Chronic obstructive pulmonary disease with acute lower respiratory infection: Secondary | ICD-10-CM | POA: Diagnosis not present

## 2024-02-11 DIAGNOSIS — E785 Hyperlipidemia, unspecified: Secondary | ICD-10-CM | POA: Diagnosis not present

## 2024-02-11 NOTE — Transitions of Care (Post Inpatient/ED Visit) (Signed)
   02/11/2024  Name: [name] MRN: [id number] DOB: [birth year]  Today's TOC FU Call Status: Today's TOC FU Call Status:: Unsuccessful Call (3rd Attempt) Unsuccessful Call (3rd Attempt) Date: 02/11/24  Attempted to reach the patient regarding the most recent Inpatient/ED visit.  Follow Up Plan: No further outreach attempts will be made at this time. We have been unable to contact the patient.  [name] J. Eartha Vonbehren RN, MSN [facility], [facility] RN Care Manager Direct Dial: [phone]  Fax: [phone] Website: [email or web address]

## 2024-02-15 ENCOUNTER — Telehealth: Payer: Self-pay | Admitting: Orthopedic Surgery

## 2024-02-15 DIAGNOSIS — I700 Atherosclerosis of aorta: Secondary | ICD-10-CM | POA: Diagnosis not present

## 2024-02-15 DIAGNOSIS — I119 Hypertensive heart disease without heart failure: Secondary | ICD-10-CM | POA: Diagnosis not present

## 2024-02-15 DIAGNOSIS — E876 Hypokalemia: Secondary | ICD-10-CM | POA: Diagnosis not present

## 2024-02-15 DIAGNOSIS — S72012D Unspecified intracapsular fracture of left femur, subsequent encounter for closed fracture with routine healing: Secondary | ICD-10-CM | POA: Diagnosis not present

## 2024-02-15 DIAGNOSIS — I080 Rheumatic disorders of both mitral and aortic valves: Secondary | ICD-10-CM | POA: Diagnosis not present

## 2024-02-15 DIAGNOSIS — J439 Emphysema, unspecified: Secondary | ICD-10-CM | POA: Diagnosis not present

## 2024-02-15 DIAGNOSIS — I495 Sick sinus syndrome: Secondary | ICD-10-CM | POA: Diagnosis not present

## 2024-02-15 DIAGNOSIS — Z8711 Personal history of peptic ulcer disease: Secondary | ICD-10-CM | POA: Diagnosis not present

## 2024-02-15 DIAGNOSIS — Z7951 Long term (current) use of inhaled steroids: Secondary | ICD-10-CM | POA: Diagnosis not present

## 2024-02-15 DIAGNOSIS — G8929 Other chronic pain: Secondary | ICD-10-CM | POA: Diagnosis not present

## 2024-02-15 DIAGNOSIS — J441 Chronic obstructive pulmonary disease with (acute) exacerbation: Secondary | ICD-10-CM | POA: Diagnosis not present

## 2024-02-15 DIAGNOSIS — F419 Anxiety disorder, unspecified: Secondary | ICD-10-CM | POA: Diagnosis not present

## 2024-02-15 DIAGNOSIS — D649 Anemia, unspecified: Secondary | ICD-10-CM | POA: Diagnosis not present

## 2024-02-15 DIAGNOSIS — E785 Hyperlipidemia, unspecified: Secondary | ICD-10-CM | POA: Diagnosis not present

## 2024-02-15 DIAGNOSIS — J9601 Acute respiratory failure with hypoxia: Secondary | ICD-10-CM | POA: Diagnosis not present

## 2024-02-15 DIAGNOSIS — J440 Chronic obstructive pulmonary disease with acute lower respiratory infection: Secondary | ICD-10-CM | POA: Diagnosis not present

## 2024-02-15 DIAGNOSIS — L280 Lichen simplex chronicus: Secondary | ICD-10-CM | POA: Diagnosis not present

## 2024-02-15 DIAGNOSIS — Z87891 Personal history of nicotine dependence: Secondary | ICD-10-CM | POA: Diagnosis not present

## 2024-02-15 DIAGNOSIS — J189 Pneumonia, unspecified organism: Secondary | ICD-10-CM | POA: Diagnosis not present

## 2024-02-15 DIAGNOSIS — K219 Gastro-esophageal reflux disease without esophagitis: Secondary | ICD-10-CM | POA: Diagnosis not present

## 2024-02-15 DIAGNOSIS — Z96642 Presence of left artificial hip joint: Secondary | ICD-10-CM | POA: Diagnosis not present

## 2024-02-15 DIAGNOSIS — J690 Pneumonitis due to inhalation of food and vomit: Secondary | ICD-10-CM | POA: Diagnosis not present

## 2024-02-15 DIAGNOSIS — E039 Hypothyroidism, unspecified: Secondary | ICD-10-CM | POA: Diagnosis not present

## 2024-02-15 DIAGNOSIS — F32A Depression, unspecified: Secondary | ICD-10-CM | POA: Diagnosis not present

## 2024-02-15 DIAGNOSIS — Z950 Presence of cardiac pacemaker: Secondary | ICD-10-CM | POA: Diagnosis not present

## 2024-02-15 NOTE — Telephone Encounter (Signed)
 Mark PT w/[facility] lvm [phone] stating that the pt has increased drainage on the wound, dressing is 80% full, but not oozing out.  The drain looks more yellow vs red. This is FYI, I will call and add the pt for tomorrow.

## 2024-02-15 NOTE — Telephone Encounter (Signed)
 Dr. Areatha pt - pt had lt hip surgery on 02/04/24, she has an appointment 02/18/24.  She fell around 4am this morning while going to the bathroom alone.  She stated she is fine, but Dawn that called on her behalf wanted to make you aware.  Per Dawn she is not having issues.  Do you need to see earlier?  [phone]

## 2024-02-16 ENCOUNTER — Other Ambulatory Visit (INDEPENDENT_AMBULATORY_CARE_PROVIDER_SITE_OTHER): Payer: Self-pay

## 2024-02-16 ENCOUNTER — Ambulatory Visit: Admitting: Orthopedic Surgery

## 2024-02-16 DIAGNOSIS — Z96642 Presence of left artificial hip joint: Secondary | ICD-10-CM

## 2024-02-16 DIAGNOSIS — M25562 Pain in left knee: Secondary | ICD-10-CM | POA: Diagnosis not present

## 2024-02-16 MED ORDER — ASPIRIN 81 MG PO TBEC: 81.0000 mg | DELAYED_RELEASE_TABLET | Freq: Every day | ORAL | 0 refills | Status: AC

## 2024-02-16 MED ORDER — HYDROCODONE-ACETAMINOPHEN 5-325 MG PO TABS: 1.0000 | ORAL_TABLET | Freq: Four times a day (QID) | ORAL | 0 refills | Status: DC | PRN

## 2024-02-16 MED ORDER — DICLOFENAC SODIUM 1 % EX GEL: 4.0000 g | Freq: Four times a day (QID) | CUTANEOUS | 3 refills | Status: AC

## 2024-02-16 NOTE — Patient Instructions (Addendum)
 Stop xarelto  switch to aspirin  81 mg twice a day for 28 days   Diarrhea continue Imodium (now that the antibiotics have ended the diarrhea should improve)  Knee pain from arthritis apply Voltaren  gel 4 times a day  Hydrocodone  refill decrease consistent dosing  Return  6   weeks

## 2024-02-16 NOTE — Progress Notes (Signed)
   There were no vitals taken for this visit.  There is no height or weight on file to calculate BMI.  Chief Complaint  Patient presents with   Post-op Problem    L Femur DOS 02/04/24. Pt has had yellow drainage from wound and bandage was changed.     No diagnosis found.  DOI/DOS/ Date: 02/04/24  Improved but had a fall 02/14/24

## 2024-02-16 NOTE — Progress Notes (Addendum)
 Patient ID: [name], female   DOB: [birth year], 78 y.o.   MRN: [id number]  Postop visit status post bipolar replacement for left femoral neck fracture  Surgery was July 11  Patient fell July 21 had some drainage from the left hip wound  Wound looks good sutures and staples were removed Steri-Strips were applied  No signs of infection   X-rays taken left knee for left knee pain patient planes of left thigh pain and groin pain  Imaging shows no fracture on the right stable prosthesis on the left and osteoarthritis of the knee joint  Patient complained of cost of Xarelto  switch to aspirin   Medication changes include addition of Voltaren  gel  Pain medication refill   Meds ordered this encounter  Medications   diclofenac  Sodium (VOLTAREN ) 1 % GEL    Sig: Apply 4 g topically 4 (four) times daily.    Dispense:  4 g    Refill:  3   aspirin  EC 81 MG tablet    Sig: Take 1 tablet (81 mg total) by mouth daily. Swallow whole.    Dispense:  60 tablet    Refill:  0   HYDROcodone -acetaminophen  (NORCO/VICODIN) 5-325 MG tablet    Sig: Take 1 tablet by mouth every 6 (six) hours as needed for moderate pain (pain score 4-6).    Dispense:  30 tablet    Refill:  0    No swelling bruising or effusion left knee painful range of motion noted left knee  Per [facility] clinic policy, our goal is ensure optimal postoperative pain control with a multimodal pain management strategy. For all [facility] patients, our goal is to wean post-operative narcotic medications by 6 weeks post-operatively. If this is not possible due to utilization of pain medication prior to surgery, your [facility] doctor will support your acute post-operative pain control for the first 6 weeks postoperatively, with a plan to transition you back to your primary pain team following that. [facility] will work to ensure a Therapist, occupational.  Fragility fracture management: Patient has had previous bone density is already on vitamin D3 no  need for further osteoporotic management at this time

## 2024-02-17 DIAGNOSIS — F32A Depression, unspecified: Secondary | ICD-10-CM | POA: Diagnosis not present

## 2024-02-17 DIAGNOSIS — S72012D Unspecified intracapsular fracture of left femur, subsequent encounter for closed fracture with routine healing: Secondary | ICD-10-CM | POA: Diagnosis not present

## 2024-02-17 DIAGNOSIS — L280 Lichen simplex chronicus: Secondary | ICD-10-CM | POA: Diagnosis not present

## 2024-02-17 DIAGNOSIS — K219 Gastro-esophageal reflux disease without esophagitis: Secondary | ICD-10-CM | POA: Diagnosis not present

## 2024-02-17 DIAGNOSIS — F419 Anxiety disorder, unspecified: Secondary | ICD-10-CM | POA: Diagnosis not present

## 2024-02-17 DIAGNOSIS — Z96642 Presence of left artificial hip joint: Secondary | ICD-10-CM | POA: Diagnosis not present

## 2024-02-17 DIAGNOSIS — E876 Hypokalemia: Secondary | ICD-10-CM | POA: Diagnosis not present

## 2024-02-17 DIAGNOSIS — J439 Emphysema, unspecified: Secondary | ICD-10-CM | POA: Diagnosis not present

## 2024-02-17 DIAGNOSIS — Z950 Presence of cardiac pacemaker: Secondary | ICD-10-CM | POA: Diagnosis not present

## 2024-02-17 DIAGNOSIS — J440 Chronic obstructive pulmonary disease with acute lower respiratory infection: Secondary | ICD-10-CM | POA: Diagnosis not present

## 2024-02-17 DIAGNOSIS — I119 Hypertensive heart disease without heart failure: Secondary | ICD-10-CM | POA: Diagnosis not present

## 2024-02-17 DIAGNOSIS — Z87891 Personal history of nicotine dependence: Secondary | ICD-10-CM | POA: Diagnosis not present

## 2024-02-17 DIAGNOSIS — E785 Hyperlipidemia, unspecified: Secondary | ICD-10-CM | POA: Diagnosis not present

## 2024-02-17 DIAGNOSIS — J189 Pneumonia, unspecified organism: Secondary | ICD-10-CM | POA: Diagnosis not present

## 2024-02-17 DIAGNOSIS — Z8711 Personal history of peptic ulcer disease: Secondary | ICD-10-CM | POA: Diagnosis not present

## 2024-02-17 DIAGNOSIS — J441 Chronic obstructive pulmonary disease with (acute) exacerbation: Secondary | ICD-10-CM | POA: Diagnosis not present

## 2024-02-17 DIAGNOSIS — G8929 Other chronic pain: Secondary | ICD-10-CM | POA: Diagnosis not present

## 2024-02-17 DIAGNOSIS — J690 Pneumonitis due to inhalation of food and vomit: Secondary | ICD-10-CM | POA: Diagnosis not present

## 2024-02-17 DIAGNOSIS — E039 Hypothyroidism, unspecified: Secondary | ICD-10-CM | POA: Diagnosis not present

## 2024-02-17 DIAGNOSIS — I700 Atherosclerosis of aorta: Secondary | ICD-10-CM | POA: Diagnosis not present

## 2024-02-17 DIAGNOSIS — I080 Rheumatic disorders of both mitral and aortic valves: Secondary | ICD-10-CM | POA: Diagnosis not present

## 2024-02-17 DIAGNOSIS — I495 Sick sinus syndrome: Secondary | ICD-10-CM | POA: Diagnosis not present

## 2024-02-17 DIAGNOSIS — D649 Anemia, unspecified: Secondary | ICD-10-CM | POA: Diagnosis not present

## 2024-02-17 DIAGNOSIS — J9601 Acute respiratory failure with hypoxia: Secondary | ICD-10-CM | POA: Diagnosis not present

## 2024-02-17 DIAGNOSIS — Z7951 Long term (current) use of inhaled steroids: Secondary | ICD-10-CM | POA: Diagnosis not present

## 2024-02-18 ENCOUNTER — Encounter: Admitting: Orthopedic Surgery

## 2024-02-21 DIAGNOSIS — F32A Depression, unspecified: Secondary | ICD-10-CM | POA: Diagnosis not present

## 2024-02-21 DIAGNOSIS — Z87891 Personal history of nicotine dependence: Secondary | ICD-10-CM | POA: Diagnosis not present

## 2024-02-21 DIAGNOSIS — K219 Gastro-esophageal reflux disease without esophagitis: Secondary | ICD-10-CM | POA: Diagnosis not present

## 2024-02-21 DIAGNOSIS — Z96642 Presence of left artificial hip joint: Secondary | ICD-10-CM | POA: Diagnosis not present

## 2024-02-21 DIAGNOSIS — I080 Rheumatic disorders of both mitral and aortic valves: Secondary | ICD-10-CM | POA: Diagnosis not present

## 2024-02-21 DIAGNOSIS — E785 Hyperlipidemia, unspecified: Secondary | ICD-10-CM | POA: Diagnosis not present

## 2024-02-21 DIAGNOSIS — F411 Generalized anxiety disorder: Secondary | ICD-10-CM | POA: Diagnosis not present

## 2024-02-21 DIAGNOSIS — J189 Pneumonia, unspecified organism: Secondary | ICD-10-CM | POA: Diagnosis not present

## 2024-02-21 DIAGNOSIS — I119 Hypertensive heart disease without heart failure: Secondary | ICD-10-CM | POA: Diagnosis not present

## 2024-02-21 DIAGNOSIS — D649 Anemia, unspecified: Secondary | ICD-10-CM | POA: Diagnosis not present

## 2024-02-21 DIAGNOSIS — I700 Atherosclerosis of aorta: Secondary | ICD-10-CM | POA: Diagnosis not present

## 2024-02-21 DIAGNOSIS — J439 Emphysema, unspecified: Secondary | ICD-10-CM | POA: Diagnosis not present

## 2024-02-21 DIAGNOSIS — G8929 Other chronic pain: Secondary | ICD-10-CM | POA: Diagnosis not present

## 2024-02-21 DIAGNOSIS — L280 Lichen simplex chronicus: Secondary | ICD-10-CM | POA: Diagnosis not present

## 2024-02-21 DIAGNOSIS — J690 Pneumonitis due to inhalation of food and vomit: Secondary | ICD-10-CM | POA: Diagnosis not present

## 2024-02-21 DIAGNOSIS — J440 Chronic obstructive pulmonary disease with acute lower respiratory infection: Secondary | ICD-10-CM | POA: Diagnosis not present

## 2024-02-21 DIAGNOSIS — E876 Hypokalemia: Secondary | ICD-10-CM | POA: Diagnosis not present

## 2024-02-21 DIAGNOSIS — Z0000 Encounter for general adult medical examination without abnormal findings: Secondary | ICD-10-CM | POA: Diagnosis not present

## 2024-02-21 DIAGNOSIS — Z7951 Long term (current) use of inhaled steroids: Secondary | ICD-10-CM | POA: Diagnosis not present

## 2024-02-21 DIAGNOSIS — I495 Sick sinus syndrome: Secondary | ICD-10-CM | POA: Diagnosis not present

## 2024-02-21 DIAGNOSIS — J9601 Acute respiratory failure with hypoxia: Secondary | ICD-10-CM | POA: Diagnosis not present

## 2024-02-21 DIAGNOSIS — S72012D Unspecified intracapsular fracture of left femur, subsequent encounter for closed fracture with routine healing: Secondary | ICD-10-CM | POA: Diagnosis not present

## 2024-02-21 DIAGNOSIS — I10 Essential (primary) hypertension: Secondary | ICD-10-CM | POA: Diagnosis not present

## 2024-02-21 DIAGNOSIS — J441 Chronic obstructive pulmonary disease with (acute) exacerbation: Secondary | ICD-10-CM | POA: Diagnosis not present

## 2024-02-21 DIAGNOSIS — Z8711 Personal history of peptic ulcer disease: Secondary | ICD-10-CM | POA: Diagnosis not present

## 2024-02-21 DIAGNOSIS — E039 Hypothyroidism, unspecified: Secondary | ICD-10-CM | POA: Diagnosis not present

## 2024-02-21 DIAGNOSIS — F419 Anxiety disorder, unspecified: Secondary | ICD-10-CM | POA: Diagnosis not present

## 2024-02-21 DIAGNOSIS — Z950 Presence of cardiac pacemaker: Secondary | ICD-10-CM | POA: Diagnosis not present

## 2024-02-21 DIAGNOSIS — J9611 Chronic respiratory failure with hypoxia: Secondary | ICD-10-CM | POA: Diagnosis not present

## 2024-02-24 DIAGNOSIS — G8929 Other chronic pain: Secondary | ICD-10-CM | POA: Diagnosis not present

## 2024-02-24 DIAGNOSIS — D649 Anemia, unspecified: Secondary | ICD-10-CM | POA: Diagnosis not present

## 2024-02-24 DIAGNOSIS — L280 Lichen simplex chronicus: Secondary | ICD-10-CM | POA: Diagnosis not present

## 2024-02-24 DIAGNOSIS — Z8711 Personal history of peptic ulcer disease: Secondary | ICD-10-CM | POA: Diagnosis not present

## 2024-02-24 DIAGNOSIS — E876 Hypokalemia: Secondary | ICD-10-CM | POA: Diagnosis not present

## 2024-02-24 DIAGNOSIS — S72012D Unspecified intracapsular fracture of left femur, subsequent encounter for closed fracture with routine healing: Secondary | ICD-10-CM | POA: Diagnosis not present

## 2024-02-24 DIAGNOSIS — I119 Hypertensive heart disease without heart failure: Secondary | ICD-10-CM | POA: Diagnosis not present

## 2024-02-24 DIAGNOSIS — Z950 Presence of cardiac pacemaker: Secondary | ICD-10-CM | POA: Diagnosis not present

## 2024-02-24 DIAGNOSIS — J189 Pneumonia, unspecified organism: Secondary | ICD-10-CM | POA: Diagnosis not present

## 2024-02-24 DIAGNOSIS — F32A Depression, unspecified: Secondary | ICD-10-CM | POA: Diagnosis not present

## 2024-02-24 DIAGNOSIS — I700 Atherosclerosis of aorta: Secondary | ICD-10-CM | POA: Diagnosis not present

## 2024-02-24 DIAGNOSIS — Z87891 Personal history of nicotine dependence: Secondary | ICD-10-CM | POA: Diagnosis not present

## 2024-02-24 DIAGNOSIS — I495 Sick sinus syndrome: Secondary | ICD-10-CM | POA: Diagnosis not present

## 2024-02-24 DIAGNOSIS — Z96642 Presence of left artificial hip joint: Secondary | ICD-10-CM | POA: Diagnosis not present

## 2024-02-24 DIAGNOSIS — K219 Gastro-esophageal reflux disease without esophagitis: Secondary | ICD-10-CM | POA: Diagnosis not present

## 2024-02-24 DIAGNOSIS — J9601 Acute respiratory failure with hypoxia: Secondary | ICD-10-CM | POA: Diagnosis not present

## 2024-02-24 DIAGNOSIS — J439 Emphysema, unspecified: Secondary | ICD-10-CM | POA: Diagnosis not present

## 2024-02-24 DIAGNOSIS — F419 Anxiety disorder, unspecified: Secondary | ICD-10-CM | POA: Diagnosis not present

## 2024-02-24 DIAGNOSIS — J690 Pneumonitis due to inhalation of food and vomit: Secondary | ICD-10-CM | POA: Diagnosis not present

## 2024-02-24 DIAGNOSIS — J441 Chronic obstructive pulmonary disease with (acute) exacerbation: Secondary | ICD-10-CM | POA: Diagnosis not present

## 2024-02-24 DIAGNOSIS — E785 Hyperlipidemia, unspecified: Secondary | ICD-10-CM | POA: Diagnosis not present

## 2024-02-24 DIAGNOSIS — J440 Chronic obstructive pulmonary disease with acute lower respiratory infection: Secondary | ICD-10-CM | POA: Diagnosis not present

## 2024-02-24 DIAGNOSIS — Z7951 Long term (current) use of inhaled steroids: Secondary | ICD-10-CM | POA: Diagnosis not present

## 2024-02-24 DIAGNOSIS — I080 Rheumatic disorders of both mitral and aortic valves: Secondary | ICD-10-CM | POA: Diagnosis not present

## 2024-02-24 DIAGNOSIS — E039 Hypothyroidism, unspecified: Secondary | ICD-10-CM | POA: Diagnosis not present

## 2024-02-29 DIAGNOSIS — J690 Pneumonitis due to inhalation of food and vomit: Secondary | ICD-10-CM | POA: Diagnosis not present

## 2024-02-29 DIAGNOSIS — K219 Gastro-esophageal reflux disease without esophagitis: Secondary | ICD-10-CM | POA: Diagnosis not present

## 2024-02-29 DIAGNOSIS — I119 Hypertensive heart disease without heart failure: Secondary | ICD-10-CM | POA: Diagnosis not present

## 2024-02-29 DIAGNOSIS — Z950 Presence of cardiac pacemaker: Secondary | ICD-10-CM | POA: Diagnosis not present

## 2024-02-29 DIAGNOSIS — I080 Rheumatic disorders of both mitral and aortic valves: Secondary | ICD-10-CM | POA: Diagnosis not present

## 2024-02-29 DIAGNOSIS — L280 Lichen simplex chronicus: Secondary | ICD-10-CM | POA: Diagnosis not present

## 2024-02-29 DIAGNOSIS — J441 Chronic obstructive pulmonary disease with (acute) exacerbation: Secondary | ICD-10-CM | POA: Diagnosis not present

## 2024-02-29 DIAGNOSIS — D649 Anemia, unspecified: Secondary | ICD-10-CM | POA: Diagnosis not present

## 2024-02-29 DIAGNOSIS — Z87891 Personal history of nicotine dependence: Secondary | ICD-10-CM | POA: Diagnosis not present

## 2024-02-29 DIAGNOSIS — G8929 Other chronic pain: Secondary | ICD-10-CM | POA: Diagnosis not present

## 2024-02-29 DIAGNOSIS — I495 Sick sinus syndrome: Secondary | ICD-10-CM | POA: Diagnosis not present

## 2024-02-29 DIAGNOSIS — F419 Anxiety disorder, unspecified: Secondary | ICD-10-CM | POA: Diagnosis not present

## 2024-02-29 DIAGNOSIS — J9601 Acute respiratory failure with hypoxia: Secondary | ICD-10-CM | POA: Diagnosis not present

## 2024-02-29 DIAGNOSIS — E039 Hypothyroidism, unspecified: Secondary | ICD-10-CM | POA: Diagnosis not present

## 2024-02-29 DIAGNOSIS — S72012D Unspecified intracapsular fracture of left femur, subsequent encounter for closed fracture with routine healing: Secondary | ICD-10-CM | POA: Diagnosis not present

## 2024-02-29 DIAGNOSIS — F32A Depression, unspecified: Secondary | ICD-10-CM | POA: Diagnosis not present

## 2024-02-29 DIAGNOSIS — J440 Chronic obstructive pulmonary disease with acute lower respiratory infection: Secondary | ICD-10-CM | POA: Diagnosis not present

## 2024-02-29 DIAGNOSIS — I700 Atherosclerosis of aorta: Secondary | ICD-10-CM | POA: Diagnosis not present

## 2024-02-29 DIAGNOSIS — E876 Hypokalemia: Secondary | ICD-10-CM | POA: Diagnosis not present

## 2024-02-29 DIAGNOSIS — Z96642 Presence of left artificial hip joint: Secondary | ICD-10-CM | POA: Diagnosis not present

## 2024-02-29 DIAGNOSIS — Z7951 Long term (current) use of inhaled steroids: Secondary | ICD-10-CM | POA: Diagnosis not present

## 2024-02-29 DIAGNOSIS — J189 Pneumonia, unspecified organism: Secondary | ICD-10-CM | POA: Diagnosis not present

## 2024-02-29 DIAGNOSIS — J439 Emphysema, unspecified: Secondary | ICD-10-CM | POA: Diagnosis not present

## 2024-02-29 DIAGNOSIS — Z8711 Personal history of peptic ulcer disease: Secondary | ICD-10-CM | POA: Diagnosis not present

## 2024-02-29 DIAGNOSIS — E785 Hyperlipidemia, unspecified: Secondary | ICD-10-CM | POA: Diagnosis not present

## 2024-03-02 DIAGNOSIS — F419 Anxiety disorder, unspecified: Secondary | ICD-10-CM | POA: Diagnosis not present

## 2024-03-02 DIAGNOSIS — I495 Sick sinus syndrome: Secondary | ICD-10-CM | POA: Diagnosis not present

## 2024-03-02 DIAGNOSIS — Z7951 Long term (current) use of inhaled steroids: Secondary | ICD-10-CM | POA: Diagnosis not present

## 2024-03-02 DIAGNOSIS — I700 Atherosclerosis of aorta: Secondary | ICD-10-CM | POA: Diagnosis not present

## 2024-03-02 DIAGNOSIS — E785 Hyperlipidemia, unspecified: Secondary | ICD-10-CM | POA: Diagnosis not present

## 2024-03-02 DIAGNOSIS — I080 Rheumatic disorders of both mitral and aortic valves: Secondary | ICD-10-CM | POA: Diagnosis not present

## 2024-03-02 DIAGNOSIS — Z950 Presence of cardiac pacemaker: Secondary | ICD-10-CM | POA: Diagnosis not present

## 2024-03-02 DIAGNOSIS — J9601 Acute respiratory failure with hypoxia: Secondary | ICD-10-CM | POA: Diagnosis not present

## 2024-03-02 DIAGNOSIS — J440 Chronic obstructive pulmonary disease with acute lower respiratory infection: Secondary | ICD-10-CM | POA: Diagnosis not present

## 2024-03-02 DIAGNOSIS — G8929 Other chronic pain: Secondary | ICD-10-CM | POA: Diagnosis not present

## 2024-03-02 DIAGNOSIS — J441 Chronic obstructive pulmonary disease with (acute) exacerbation: Secondary | ICD-10-CM | POA: Diagnosis not present

## 2024-03-02 DIAGNOSIS — F32A Depression, unspecified: Secondary | ICD-10-CM | POA: Diagnosis not present

## 2024-03-02 DIAGNOSIS — Z8711 Personal history of peptic ulcer disease: Secondary | ICD-10-CM | POA: Diagnosis not present

## 2024-03-02 DIAGNOSIS — E039 Hypothyroidism, unspecified: Secondary | ICD-10-CM | POA: Diagnosis not present

## 2024-03-02 DIAGNOSIS — S72012D Unspecified intracapsular fracture of left femur, subsequent encounter for closed fracture with routine healing: Secondary | ICD-10-CM | POA: Diagnosis not present

## 2024-03-02 DIAGNOSIS — J690 Pneumonitis due to inhalation of food and vomit: Secondary | ICD-10-CM | POA: Diagnosis not present

## 2024-03-02 DIAGNOSIS — E876 Hypokalemia: Secondary | ICD-10-CM | POA: Diagnosis not present

## 2024-03-02 DIAGNOSIS — J439 Emphysema, unspecified: Secondary | ICD-10-CM | POA: Diagnosis not present

## 2024-03-02 DIAGNOSIS — Z96642 Presence of left artificial hip joint: Secondary | ICD-10-CM | POA: Diagnosis not present

## 2024-03-02 DIAGNOSIS — I119 Hypertensive heart disease without heart failure: Secondary | ICD-10-CM | POA: Diagnosis not present

## 2024-03-02 DIAGNOSIS — Z87891 Personal history of nicotine dependence: Secondary | ICD-10-CM | POA: Diagnosis not present

## 2024-03-02 DIAGNOSIS — J189 Pneumonia, unspecified organism: Secondary | ICD-10-CM | POA: Diagnosis not present

## 2024-03-02 DIAGNOSIS — D649 Anemia, unspecified: Secondary | ICD-10-CM | POA: Diagnosis not present

## 2024-03-02 DIAGNOSIS — L280 Lichen simplex chronicus: Secondary | ICD-10-CM | POA: Diagnosis not present

## 2024-03-02 DIAGNOSIS — K219 Gastro-esophageal reflux disease without esophagitis: Secondary | ICD-10-CM | POA: Diagnosis not present

## 2024-03-07 NOTE — Progress Notes (Signed)
 Remote pacemaker transmission.

## 2024-03-07 NOTE — Addendum Note (Signed)
 Addended by: VICCI SELLER A on: 03/07/2024 12:07 PM   Modules accepted: Orders

## 2024-03-08 DIAGNOSIS — D649 Anemia, unspecified: Secondary | ICD-10-CM | POA: Diagnosis not present

## 2024-03-08 DIAGNOSIS — J690 Pneumonitis due to inhalation of food and vomit: Secondary | ICD-10-CM | POA: Diagnosis not present

## 2024-03-08 DIAGNOSIS — J440 Chronic obstructive pulmonary disease with acute lower respiratory infection: Secondary | ICD-10-CM | POA: Diagnosis not present

## 2024-03-08 DIAGNOSIS — I495 Sick sinus syndrome: Secondary | ICD-10-CM | POA: Diagnosis not present

## 2024-03-08 DIAGNOSIS — F419 Anxiety disorder, unspecified: Secondary | ICD-10-CM | POA: Diagnosis not present

## 2024-03-08 DIAGNOSIS — E876 Hypokalemia: Secondary | ICD-10-CM | POA: Diagnosis not present

## 2024-03-08 DIAGNOSIS — E785 Hyperlipidemia, unspecified: Secondary | ICD-10-CM | POA: Diagnosis not present

## 2024-03-08 DIAGNOSIS — Z8711 Personal history of peptic ulcer disease: Secondary | ICD-10-CM | POA: Diagnosis not present

## 2024-03-08 DIAGNOSIS — F32A Depression, unspecified: Secondary | ICD-10-CM | POA: Diagnosis not present

## 2024-03-08 DIAGNOSIS — Z950 Presence of cardiac pacemaker: Secondary | ICD-10-CM | POA: Diagnosis not present

## 2024-03-08 DIAGNOSIS — E039 Hypothyroidism, unspecified: Secondary | ICD-10-CM | POA: Diagnosis not present

## 2024-03-08 DIAGNOSIS — L280 Lichen simplex chronicus: Secondary | ICD-10-CM | POA: Diagnosis not present

## 2024-03-08 DIAGNOSIS — Z7951 Long term (current) use of inhaled steroids: Secondary | ICD-10-CM | POA: Diagnosis not present

## 2024-03-08 DIAGNOSIS — Z96642 Presence of left artificial hip joint: Secondary | ICD-10-CM | POA: Diagnosis not present

## 2024-03-08 DIAGNOSIS — I700 Atherosclerosis of aorta: Secondary | ICD-10-CM | POA: Diagnosis not present

## 2024-03-08 DIAGNOSIS — Z87891 Personal history of nicotine dependence: Secondary | ICD-10-CM | POA: Diagnosis not present

## 2024-03-08 DIAGNOSIS — G8929 Other chronic pain: Secondary | ICD-10-CM | POA: Diagnosis not present

## 2024-03-08 DIAGNOSIS — I119 Hypertensive heart disease without heart failure: Secondary | ICD-10-CM | POA: Diagnosis not present

## 2024-03-08 DIAGNOSIS — S72012D Unspecified intracapsular fracture of left femur, subsequent encounter for closed fracture with routine healing: Secondary | ICD-10-CM | POA: Diagnosis not present

## 2024-03-08 DIAGNOSIS — K219 Gastro-esophageal reflux disease without esophagitis: Secondary | ICD-10-CM | POA: Diagnosis not present

## 2024-03-08 DIAGNOSIS — J441 Chronic obstructive pulmonary disease with (acute) exacerbation: Secondary | ICD-10-CM | POA: Diagnosis not present

## 2024-03-08 DIAGNOSIS — J189 Pneumonia, unspecified organism: Secondary | ICD-10-CM | POA: Diagnosis not present

## 2024-03-08 DIAGNOSIS — J439 Emphysema, unspecified: Secondary | ICD-10-CM | POA: Diagnosis not present

## 2024-03-08 DIAGNOSIS — I080 Rheumatic disorders of both mitral and aortic valves: Secondary | ICD-10-CM | POA: Diagnosis not present

## 2024-03-08 DIAGNOSIS — J9601 Acute respiratory failure with hypoxia: Secondary | ICD-10-CM | POA: Diagnosis not present

## 2024-03-12 DIAGNOSIS — J449 Chronic obstructive pulmonary disease, unspecified: Secondary | ICD-10-CM | POA: Diagnosis not present

## 2024-03-13 ENCOUNTER — Telehealth: Payer: Self-pay | Admitting: Orthopedic Surgery

## 2024-03-13 NOTE — Telephone Encounter (Signed)
 Mark PT w/[facility] [phone] lvm on 03/10/24 after hours stating that he wanted to report the patient fell on 03/07/24.  He saw her for PT on 03/08/24.  She didn't appear to be hurt, he instructed her to always use her walker.  Her son had stated that she had been moving around the house w/out her walker.  He just wanted to make Dr. Margrette aware she feel, seems fine and they'll continue PT as planned.

## 2024-03-13 NOTE — Telephone Encounter (Signed)
 ERROR: Please disregard this message

## 2024-03-13 NOTE — Telephone Encounter (Signed)
 [place] a PT in [place] [phone] lvm on 03/10/24 after hours stating that the pt has 2 more weeks of HH PT.  She would like to extend PT to 4 weeks total.  The patient is progressing well, she lives in an assisted living facility and her spouse just had some health issues and he has been in the hospital.  Transportation is an issue for her to get to and from OP PT.  She can take a verbal order.

## 2024-03-16 DIAGNOSIS — I495 Sick sinus syndrome: Secondary | ICD-10-CM | POA: Diagnosis not present

## 2024-03-16 DIAGNOSIS — J441 Chronic obstructive pulmonary disease with (acute) exacerbation: Secondary | ICD-10-CM | POA: Diagnosis not present

## 2024-03-16 DIAGNOSIS — E039 Hypothyroidism, unspecified: Secondary | ICD-10-CM | POA: Diagnosis not present

## 2024-03-16 DIAGNOSIS — J9601 Acute respiratory failure with hypoxia: Secondary | ICD-10-CM | POA: Diagnosis not present

## 2024-03-16 DIAGNOSIS — J189 Pneumonia, unspecified organism: Secondary | ICD-10-CM | POA: Diagnosis not present

## 2024-03-16 DIAGNOSIS — Z7951 Long term (current) use of inhaled steroids: Secondary | ICD-10-CM | POA: Diagnosis not present

## 2024-03-16 DIAGNOSIS — I700 Atherosclerosis of aorta: Secondary | ICD-10-CM | POA: Diagnosis not present

## 2024-03-16 DIAGNOSIS — F32A Depression, unspecified: Secondary | ICD-10-CM | POA: Diagnosis not present

## 2024-03-16 DIAGNOSIS — K219 Gastro-esophageal reflux disease without esophagitis: Secondary | ICD-10-CM | POA: Diagnosis not present

## 2024-03-16 DIAGNOSIS — J690 Pneumonitis due to inhalation of food and vomit: Secondary | ICD-10-CM | POA: Diagnosis not present

## 2024-03-16 DIAGNOSIS — Z950 Presence of cardiac pacemaker: Secondary | ICD-10-CM | POA: Diagnosis not present

## 2024-03-16 DIAGNOSIS — Z96642 Presence of left artificial hip joint: Secondary | ICD-10-CM | POA: Diagnosis not present

## 2024-03-16 DIAGNOSIS — S72012D Unspecified intracapsular fracture of left femur, subsequent encounter for closed fracture with routine healing: Secondary | ICD-10-CM | POA: Diagnosis not present

## 2024-03-16 DIAGNOSIS — L280 Lichen simplex chronicus: Secondary | ICD-10-CM | POA: Diagnosis not present

## 2024-03-16 DIAGNOSIS — I119 Hypertensive heart disease without heart failure: Secondary | ICD-10-CM | POA: Diagnosis not present

## 2024-03-16 DIAGNOSIS — I080 Rheumatic disorders of both mitral and aortic valves: Secondary | ICD-10-CM | POA: Diagnosis not present

## 2024-03-16 DIAGNOSIS — Z87891 Personal history of nicotine dependence: Secondary | ICD-10-CM | POA: Diagnosis not present

## 2024-03-16 DIAGNOSIS — E785 Hyperlipidemia, unspecified: Secondary | ICD-10-CM | POA: Diagnosis not present

## 2024-03-16 DIAGNOSIS — F419 Anxiety disorder, unspecified: Secondary | ICD-10-CM | POA: Diagnosis not present

## 2024-03-16 DIAGNOSIS — J439 Emphysema, unspecified: Secondary | ICD-10-CM | POA: Diagnosis not present

## 2024-03-16 DIAGNOSIS — Z8711 Personal history of peptic ulcer disease: Secondary | ICD-10-CM | POA: Diagnosis not present

## 2024-03-16 DIAGNOSIS — G8929 Other chronic pain: Secondary | ICD-10-CM | POA: Diagnosis not present

## 2024-03-16 DIAGNOSIS — J440 Chronic obstructive pulmonary disease with acute lower respiratory infection: Secondary | ICD-10-CM | POA: Diagnosis not present

## 2024-03-16 DIAGNOSIS — D649 Anemia, unspecified: Secondary | ICD-10-CM | POA: Diagnosis not present

## 2024-03-16 DIAGNOSIS — E876 Hypokalemia: Secondary | ICD-10-CM | POA: Diagnosis not present

## 2024-03-21 DIAGNOSIS — Z7951 Long term (current) use of inhaled steroids: Secondary | ICD-10-CM | POA: Diagnosis not present

## 2024-03-21 DIAGNOSIS — I080 Rheumatic disorders of both mitral and aortic valves: Secondary | ICD-10-CM | POA: Diagnosis not present

## 2024-03-21 DIAGNOSIS — Z950 Presence of cardiac pacemaker: Secondary | ICD-10-CM | POA: Diagnosis not present

## 2024-03-21 DIAGNOSIS — G8929 Other chronic pain: Secondary | ICD-10-CM | POA: Diagnosis not present

## 2024-03-21 DIAGNOSIS — I119 Hypertensive heart disease without heart failure: Secondary | ICD-10-CM | POA: Diagnosis not present

## 2024-03-21 DIAGNOSIS — Z96642 Presence of left artificial hip joint: Secondary | ICD-10-CM | POA: Diagnosis not present

## 2024-03-21 DIAGNOSIS — F419 Anxiety disorder, unspecified: Secondary | ICD-10-CM | POA: Diagnosis not present

## 2024-03-21 DIAGNOSIS — J189 Pneumonia, unspecified organism: Secondary | ICD-10-CM | POA: Diagnosis not present

## 2024-03-21 DIAGNOSIS — E039 Hypothyroidism, unspecified: Secondary | ICD-10-CM | POA: Diagnosis not present

## 2024-03-21 DIAGNOSIS — J440 Chronic obstructive pulmonary disease with acute lower respiratory infection: Secondary | ICD-10-CM | POA: Diagnosis not present

## 2024-03-21 DIAGNOSIS — D649 Anemia, unspecified: Secondary | ICD-10-CM | POA: Diagnosis not present

## 2024-03-21 DIAGNOSIS — S72012D Unspecified intracapsular fracture of left femur, subsequent encounter for closed fracture with routine healing: Secondary | ICD-10-CM | POA: Diagnosis not present

## 2024-03-21 DIAGNOSIS — J690 Pneumonitis due to inhalation of food and vomit: Secondary | ICD-10-CM | POA: Diagnosis not present

## 2024-03-21 DIAGNOSIS — I495 Sick sinus syndrome: Secondary | ICD-10-CM | POA: Diagnosis not present

## 2024-03-21 DIAGNOSIS — I700 Atherosclerosis of aorta: Secondary | ICD-10-CM | POA: Diagnosis not present

## 2024-03-21 DIAGNOSIS — J9601 Acute respiratory failure with hypoxia: Secondary | ICD-10-CM | POA: Diagnosis not present

## 2024-03-21 DIAGNOSIS — L280 Lichen simplex chronicus: Secondary | ICD-10-CM | POA: Diagnosis not present

## 2024-03-21 DIAGNOSIS — J439 Emphysema, unspecified: Secondary | ICD-10-CM | POA: Diagnosis not present

## 2024-03-21 DIAGNOSIS — Z8711 Personal history of peptic ulcer disease: Secondary | ICD-10-CM | POA: Diagnosis not present

## 2024-03-21 DIAGNOSIS — Z87891 Personal history of nicotine dependence: Secondary | ICD-10-CM | POA: Diagnosis not present

## 2024-03-21 DIAGNOSIS — F32A Depression, unspecified: Secondary | ICD-10-CM | POA: Diagnosis not present

## 2024-03-21 DIAGNOSIS — E876 Hypokalemia: Secondary | ICD-10-CM | POA: Diagnosis not present

## 2024-03-21 DIAGNOSIS — E785 Hyperlipidemia, unspecified: Secondary | ICD-10-CM | POA: Diagnosis not present

## 2024-03-21 DIAGNOSIS — J441 Chronic obstructive pulmonary disease with (acute) exacerbation: Secondary | ICD-10-CM | POA: Diagnosis not present

## 2024-03-21 DIAGNOSIS — K219 Gastro-esophageal reflux disease without esophagitis: Secondary | ICD-10-CM | POA: Diagnosis not present

## 2024-03-24 ENCOUNTER — Other Ambulatory Visit: Payer: Self-pay | Admitting: Orthopedic Surgery

## 2024-03-24 DIAGNOSIS — Z96642 Presence of left artificial hip joint: Secondary | ICD-10-CM

## 2024-03-24 NOTE — Telephone Encounter (Signed)
 DR. MARGRETTE   Patient request refill on her pain medicine she is in a lot of pain on a scale of 1 to 10 she is a 7 she does not need to come in today she just needs her pain medicine. She said she is coming to see you next week   HYDROcodone -acetaminophen  (NORCO/VICODIN) 5-325 MG tablet    Pharmacy  [facility]

## 2024-03-29 ENCOUNTER — Other Ambulatory Visit: Payer: Self-pay | Admitting: Orthopedic Surgery

## 2024-03-29 ENCOUNTER — Telehealth: Payer: Self-pay | Admitting: Orthopedic Surgery

## 2024-03-29 DIAGNOSIS — G8918 Other acute postprocedural pain: Secondary | ICD-10-CM

## 2024-03-29 DIAGNOSIS — Z96642 Presence of left artificial hip joint: Secondary | ICD-10-CM

## 2024-03-29 MED ORDER — IBUPROFEN 800 MG PO TABS: 800.0000 mg | ORAL_TABLET | Freq: Three times a day (TID) | ORAL | 1 refills | Status: AC | PRN

## 2024-03-29 NOTE — Telephone Encounter (Signed)
 Dr. Areatha pt - spoke w/the pt, she is requesting a refill for Hydrocodone  5-325, 30 tablets, every 6 hrs PRN for moderate pain to be sent to [facility]

## 2024-03-30 ENCOUNTER — Other Ambulatory Visit (INDEPENDENT_AMBULATORY_CARE_PROVIDER_SITE_OTHER): Payer: Self-pay

## 2024-03-30 ENCOUNTER — Encounter: Payer: Self-pay | Admitting: Orthopedic Surgery

## 2024-03-30 ENCOUNTER — Ambulatory Visit: Admitting: Orthopedic Surgery

## 2024-03-30 ENCOUNTER — Other Ambulatory Visit: Payer: Self-pay | Admitting: Orthopedic Surgery

## 2024-03-30 DIAGNOSIS — S72002A Fracture of unspecified part of neck of left femur, initial encounter for closed fracture: Secondary | ICD-10-CM

## 2024-03-30 DIAGNOSIS — M5410 Radiculopathy, site unspecified: Secondary | ICD-10-CM | POA: Diagnosis not present

## 2024-03-30 MED ORDER — GABAPENTIN 400 MG PO CAPS: 400.0000 mg | ORAL_CAPSULE | ORAL | 5 refills | Status: AC

## 2024-03-30 MED ORDER — PREDNISONE 10 MG (48) PO TBPK: ORAL_TABLET | Freq: Every day | ORAL | 0 refills | Status: DC

## 2024-03-30 NOTE — Progress Notes (Signed)
   Chief Complaint  Patient presents with   Post-op Follow-up    Left hip     Encounter Diagnosis  Name Primary?   Left displaced femoral neck fracture (HCC) 02/03/24 left hip Bipolar replacement Yes    DOI/DOS/ Date: 02/03/24  Unchanged, states unable to put weight on the hip without pain, has to use walker

## 2024-03-30 NOTE — Progress Notes (Signed)
 Postop appointment  Chief Complaint  Patient presents with   Post-op Follow-up    Left hip     Encounter Diagnoses  Name Primary?   Left displaced femoral neck fracture (HCC) 02/03/24 left hip Bipolar replacement    Radicular pain of left lower extremity Yes    DOI/DOS/ Date: 02/03/24  Unchanged, states unable to put weight on the hip without pain, has to use walker   Patient seems to have pain in the lower back radiating down into the lower lateral leg in the L5 root she has a history of back problems in the past nothing recently  Having trouble weightbearing on the left leg due to pain at the knee and lower leg  No groin or lateral hip pain Denied lower back pain but is tender to palpation in the lower back and the anterolateral leg Dorsiflexion strength is normal she can hip flex normal range of motion of the hip normal no thigh pain or tenderness or pain to palpation  DG Lumbar Spine 2-3 Views Result Date: 03/30/2024 Spinal imaging Left leg pain after hip replacement for fracture Status post bipolar replacement X-ray shows mild scoliosis mild facet arthritis Mild facet arthritis lumbar spine     Encounter Diagnoses  Name Primary?   Left displaced femoral neck fracture (HCC) 02/03/24 left hip Bipolar replacement    Radicular pain of left lower extremity Yes    Recommend medication at first if no improvement then I recommend she see a spine specialist to treat the radicular pain  Meds ordered this encounter  Medications   predniSONE  (STERAPRED UNI-PAK 48 TAB) 10 MG (48) TBPK tablet    Sig: Take by mouth daily.    Dispense:  48 tablet    Refill:  0   gabapentin  (NEURONTIN ) 400 MG capsule    Sig: Take 1-2 capsules (400-800 mg total) by mouth See admin instructions. Take 1 capsule (400 mg) by mouth in the morning & take 2 capsules (800 mg) by mouth at night.    Dispense:  180 capsule    Refill:  5   Recheck 6 weeks

## 2024-03-31 DIAGNOSIS — E039 Hypothyroidism, unspecified: Secondary | ICD-10-CM | POA: Diagnosis not present

## 2024-03-31 DIAGNOSIS — Z8711 Personal history of peptic ulcer disease: Secondary | ICD-10-CM | POA: Diagnosis not present

## 2024-03-31 DIAGNOSIS — Z96642 Presence of left artificial hip joint: Secondary | ICD-10-CM | POA: Diagnosis not present

## 2024-03-31 DIAGNOSIS — I080 Rheumatic disorders of both mitral and aortic valves: Secondary | ICD-10-CM | POA: Diagnosis not present

## 2024-03-31 DIAGNOSIS — Z950 Presence of cardiac pacemaker: Secondary | ICD-10-CM | POA: Diagnosis not present

## 2024-03-31 DIAGNOSIS — G8929 Other chronic pain: Secondary | ICD-10-CM | POA: Diagnosis not present

## 2024-03-31 DIAGNOSIS — J441 Chronic obstructive pulmonary disease with (acute) exacerbation: Secondary | ICD-10-CM | POA: Diagnosis not present

## 2024-03-31 DIAGNOSIS — Z87891 Personal history of nicotine dependence: Secondary | ICD-10-CM | POA: Diagnosis not present

## 2024-03-31 DIAGNOSIS — I495 Sick sinus syndrome: Secondary | ICD-10-CM | POA: Diagnosis not present

## 2024-03-31 DIAGNOSIS — J9601 Acute respiratory failure with hypoxia: Secondary | ICD-10-CM | POA: Diagnosis not present

## 2024-03-31 DIAGNOSIS — J440 Chronic obstructive pulmonary disease with acute lower respiratory infection: Secondary | ICD-10-CM | POA: Diagnosis not present

## 2024-03-31 DIAGNOSIS — J690 Pneumonitis due to inhalation of food and vomit: Secondary | ICD-10-CM | POA: Diagnosis not present

## 2024-03-31 DIAGNOSIS — E876 Hypokalemia: Secondary | ICD-10-CM | POA: Diagnosis not present

## 2024-03-31 DIAGNOSIS — E785 Hyperlipidemia, unspecified: Secondary | ICD-10-CM | POA: Diagnosis not present

## 2024-03-31 DIAGNOSIS — S72012D Unspecified intracapsular fracture of left femur, subsequent encounter for closed fracture with routine healing: Secondary | ICD-10-CM | POA: Diagnosis not present

## 2024-03-31 DIAGNOSIS — F32A Depression, unspecified: Secondary | ICD-10-CM | POA: Diagnosis not present

## 2024-03-31 DIAGNOSIS — L280 Lichen simplex chronicus: Secondary | ICD-10-CM | POA: Diagnosis not present

## 2024-03-31 DIAGNOSIS — K219 Gastro-esophageal reflux disease without esophagitis: Secondary | ICD-10-CM | POA: Diagnosis not present

## 2024-03-31 DIAGNOSIS — D649 Anemia, unspecified: Secondary | ICD-10-CM | POA: Diagnosis not present

## 2024-03-31 DIAGNOSIS — J189 Pneumonia, unspecified organism: Secondary | ICD-10-CM | POA: Diagnosis not present

## 2024-03-31 DIAGNOSIS — Z7951 Long term (current) use of inhaled steroids: Secondary | ICD-10-CM | POA: Diagnosis not present

## 2024-03-31 DIAGNOSIS — F419 Anxiety disorder, unspecified: Secondary | ICD-10-CM | POA: Diagnosis not present

## 2024-03-31 DIAGNOSIS — J439 Emphysema, unspecified: Secondary | ICD-10-CM | POA: Diagnosis not present

## 2024-03-31 DIAGNOSIS — I700 Atherosclerosis of aorta: Secondary | ICD-10-CM | POA: Diagnosis not present

## 2024-03-31 DIAGNOSIS — I119 Hypertensive heart disease without heart failure: Secondary | ICD-10-CM | POA: Diagnosis not present

## 2024-04-04 ENCOUNTER — Ambulatory Visit (INDEPENDENT_AMBULATORY_CARE_PROVIDER_SITE_OTHER): Payer: Self-pay

## 2024-04-04 DIAGNOSIS — I495 Sick sinus syndrome: Secondary | ICD-10-CM | POA: Diagnosis not present

## 2024-04-05 DIAGNOSIS — I495 Sick sinus syndrome: Secondary | ICD-10-CM | POA: Diagnosis not present

## 2024-04-05 DIAGNOSIS — J189 Pneumonia, unspecified organism: Secondary | ICD-10-CM | POA: Diagnosis not present

## 2024-04-05 DIAGNOSIS — Z7951 Long term (current) use of inhaled steroids: Secondary | ICD-10-CM | POA: Diagnosis not present

## 2024-04-05 DIAGNOSIS — Z8711 Personal history of peptic ulcer disease: Secondary | ICD-10-CM | POA: Diagnosis not present

## 2024-04-05 DIAGNOSIS — G8929 Other chronic pain: Secondary | ICD-10-CM | POA: Diagnosis not present

## 2024-04-05 DIAGNOSIS — F419 Anxiety disorder, unspecified: Secondary | ICD-10-CM | POA: Diagnosis not present

## 2024-04-05 DIAGNOSIS — J440 Chronic obstructive pulmonary disease with acute lower respiratory infection: Secondary | ICD-10-CM | POA: Diagnosis not present

## 2024-04-05 DIAGNOSIS — J439 Emphysema, unspecified: Secondary | ICD-10-CM | POA: Diagnosis not present

## 2024-04-05 DIAGNOSIS — S72012D Unspecified intracapsular fracture of left femur, subsequent encounter for closed fracture with routine healing: Secondary | ICD-10-CM | POA: Diagnosis not present

## 2024-04-05 DIAGNOSIS — D649 Anemia, unspecified: Secondary | ICD-10-CM | POA: Diagnosis not present

## 2024-04-05 DIAGNOSIS — J9601 Acute respiratory failure with hypoxia: Secondary | ICD-10-CM | POA: Diagnosis not present

## 2024-04-05 DIAGNOSIS — J690 Pneumonitis due to inhalation of food and vomit: Secondary | ICD-10-CM | POA: Diagnosis not present

## 2024-04-05 DIAGNOSIS — F32A Depression, unspecified: Secondary | ICD-10-CM | POA: Diagnosis not present

## 2024-04-05 DIAGNOSIS — I080 Rheumatic disorders of both mitral and aortic valves: Secondary | ICD-10-CM | POA: Diagnosis not present

## 2024-04-05 DIAGNOSIS — E039 Hypothyroidism, unspecified: Secondary | ICD-10-CM | POA: Diagnosis not present

## 2024-04-05 DIAGNOSIS — I119 Hypertensive heart disease without heart failure: Secondary | ICD-10-CM | POA: Diagnosis not present

## 2024-04-05 DIAGNOSIS — E785 Hyperlipidemia, unspecified: Secondary | ICD-10-CM | POA: Diagnosis not present

## 2024-04-05 DIAGNOSIS — Z950 Presence of cardiac pacemaker: Secondary | ICD-10-CM | POA: Diagnosis not present

## 2024-04-05 DIAGNOSIS — E876 Hypokalemia: Secondary | ICD-10-CM | POA: Diagnosis not present

## 2024-04-05 DIAGNOSIS — J441 Chronic obstructive pulmonary disease with (acute) exacerbation: Secondary | ICD-10-CM | POA: Diagnosis not present

## 2024-04-05 DIAGNOSIS — K219 Gastro-esophageal reflux disease without esophagitis: Secondary | ICD-10-CM | POA: Diagnosis not present

## 2024-04-05 DIAGNOSIS — Z96642 Presence of left artificial hip joint: Secondary | ICD-10-CM | POA: Diagnosis not present

## 2024-04-05 DIAGNOSIS — I700 Atherosclerosis of aorta: Secondary | ICD-10-CM | POA: Diagnosis not present

## 2024-04-05 DIAGNOSIS — Z87891 Personal history of nicotine dependence: Secondary | ICD-10-CM | POA: Diagnosis not present

## 2024-04-05 DIAGNOSIS — L280 Lichen simplex chronicus: Secondary | ICD-10-CM | POA: Diagnosis not present

## 2024-04-05 LAB — CUP PACEART REMOTE DEVICE CHECK
Battery Remaining Longevity: 83 mo
Battery Remaining Percentage: 67 %
Battery Voltage: 2.99 V
Brady Statistic AP VP Percent: 1 %
Brady Statistic AP VS Percent: 58 %
Brady Statistic AS VP Percent: 1 %
Brady Statistic AS VS Percent: 41 %
Brady Statistic RA Percent Paced: 58 %
Brady Statistic RV Percent Paced: 1 %
Date Time Interrogation Session: 20250909020015
Implantable Lead Connection Status: 753985
Implantable Lead Connection Status: 753985
Implantable Lead Implant Date: 20100908
Implantable Lead Implant Date: 20100908
Implantable Lead Location: 753859
Implantable Lead Location: 753860
Implantable Lead Model: 1944
Implantable Lead Model: 1948
Implantable Pulse Generator Implant Date: 20210914
Lead Channel Impedance Value: 450 Ohm
Lead Channel Impedance Value: 460 Ohm
Lead Channel Pacing Threshold Amplitude: 0.625 V
Lead Channel Pacing Threshold Amplitude: 0.75 V
Lead Channel Pacing Threshold Pulse Width: 0.4 ms
Lead Channel Pacing Threshold Pulse Width: 0.5 ms
Lead Channel Sensing Intrinsic Amplitude: 10.6 mV
Lead Channel Sensing Intrinsic Amplitude: 3.1 mV
Lead Channel Setting Pacing Amplitude: 1 V
Lead Channel Setting Pacing Amplitude: 1.625
Lead Channel Setting Pacing Pulse Width: 0.4 ms
Lead Channel Setting Sensing Sensitivity: 2 mV
Pulse Gen Model: 2272
Pulse Gen Serial Number: 3863879

## 2024-04-07 ENCOUNTER — Telehealth: Payer: Self-pay | Admitting: Orthopedic Surgery

## 2024-04-07 NOTE — Telephone Encounter (Signed)
 Dr. Areatha [name] - [name] lvm stating she was going home and would like Dr. VEAR to order a portable oxygen  tank.

## 2024-04-07 NOTE — Telephone Encounter (Signed)
 Lm for her to call back

## 2024-04-10 NOTE — Telephone Encounter (Signed)
 Spoke w/the pt and advised, she verbalized understanding.

## 2024-04-14 ENCOUNTER — Ambulatory Visit: Payer: Self-pay | Admitting: Cardiovascular Disease

## 2024-04-14 NOTE — Progress Notes (Signed)
 Remote PPM Transmission

## 2024-05-08 DIAGNOSIS — F3341 Major depressive disorder, recurrent, in partial remission: Secondary | ICD-10-CM | POA: Diagnosis not present

## 2024-05-11 ENCOUNTER — Ambulatory Visit (INDEPENDENT_AMBULATORY_CARE_PROVIDER_SITE_OTHER): Admitting: Orthopedic Surgery

## 2024-05-11 ENCOUNTER — Encounter: Payer: Self-pay | Admitting: Orthopedic Surgery

## 2024-05-11 DIAGNOSIS — M5410 Radiculopathy, site unspecified: Secondary | ICD-10-CM | POA: Diagnosis not present

## 2024-05-11 DIAGNOSIS — S72002A Fracture of unspecified part of neck of left femur, initial encounter for closed fracture: Secondary | ICD-10-CM

## 2024-05-11 NOTE — Patient Instructions (Addendum)
 While we are working on your approval for MRI please go ahead and call to schedule your appointment with [facility] within at least one (1) week.   Central Scheduling [phone]  [facility] will order,you can call to schedule tomorrow

## 2024-05-11 NOTE — Progress Notes (Signed)
 POST OP VISIT   Patient: [name]           Date of Birth: Jul 31, 1945           MRN: [id number] Visit Date: 05/11/2024 Requested by: Orpha Yancey LABOR, MD [address] [place],  [place] [address] PCP: Orpha Yancey LABOR, MD   Encounter Diagnoses  Name Primary?   Left displaced femoral neck fracture (HCC) 02/03/24 left hip Bipolar replacement Yes   Radicular pain of left lower extremity    PROCEDURE: Bipolar replacement press-fit stem  Chief Complaint  Patient presents with   Back Pain    Down left leg    Post-op Follow-up    Hip left    No Known Allergies    Current Outpatient Medications:    ibuprofen  (ADVIL ) 800 MG tablet, Take 1 tablet (800 mg total) by mouth every 8 (eight) hours as needed., Disp: 90 tablet, Rfl: 1   albuterol  (VENTOLIN  HFA) 108 (90 Base) MCG/ACT inhaler, Inhale 2 puffs into the lungs every 6 (six) hours as needed for wheezing or shortness of breath., Disp: , Rfl:    ALPRAZolam  (XANAX ) 1 MG tablet, Take 1 mg by mouth 2 (two) times daily., Disp: , Rfl:    Ascorbic Acid (VITAMIN C) 1000 MG tablet, Take 1,000 mg by mouth daily., Disp: , Rfl:    aspirin  EC 81 MG tablet, Take 1 tablet (81 mg total) by mouth daily. Swallow whole., Disp: 60 tablet, Rfl: 0   b complex vitamins tablet, Take 1 tablet by mouth daily., Disp: , Rfl:    Calcium 200 MG TABS, Take 1 tablet by mouth daily., Disp: , Rfl:    Cholecalciferol (DIALYVITE VITAMIN D 5000) 125 MCG (5000 UT) capsule, Take 5,000 Units by mouth daily., Disp: , Rfl:    diclofenac  Sodium (VOLTAREN ) 1 % GEL, Apply 4 g topically 4 (four) times daily., Disp: 4 g, Rfl: 3   diphenhydrAMINE  (BENADRYL ) 25 mg capsule, Take 25 mg by mouth every 6 (six) hours as needed for allergies., Disp: , Rfl:    docusate sodium  (COLACE) 100 MG capsule, Take 1 capsule (100 mg total) by mouth 2 (two) times daily., Disp: 60 capsule, Rfl: 0   famotidine  (PEPCID ) 40 MG tablet, Take 40 mg by mouth 2 (two) times daily., Disp: , Rfl:     fluticasone -salmeterol (ADVAIR) 250-50 MCG/ACT AEPB, Inhale 1 puff into the lungs 2 (two) times daily., Disp: , Rfl:    folic acid (FOLVITE) 1 MG tablet, Take 1 mg by mouth daily., Disp: , Rfl: 1   gabapentin  (NEURONTIN ) 400 MG capsule, Take 1-2 capsules (400-800 mg total) by mouth See admin instructions. Take 1 capsule (400 mg) by mouth in the morning & take 2 capsules (800 mg) by mouth at night., Disp: 180 capsule, Rfl: 5   HYDROcodone -acetaminophen  (NORCO/VICODIN) 5-325 MG tablet, Take 1 tablet by mouth every 6 (six) hours as needed for moderate pain (pain score 4-6)., Disp: 30 tablet, Rfl: 0   ibandronate (BONIVA) 150 MG tablet, Take 150 mg by mouth every 30 (thirty) days., Disp: , Rfl:    ipratropium-albuterol  (DUONEB) 0.5-2.5 (3) MG/3ML SOLN, Take 3 mLs by nebulization every 4 (four) hours as needed (shortness of breath)., Disp: , Rfl:    levothyroxine  (SYNTHROID ) 25 MCG tablet, Take 25 mcg by mouth every morning., Disp: , Rfl:    Omega-3 Fatty Acids (FISH OIL) 1000 MG CAPS, Take 1,000 mg by mouth 3 (three) times daily. , Disp: , Rfl:  polyethylene glycol (MIRALAX  / GLYCOLAX ) 17 g packet, Take 17 g by mouth daily as needed for mild constipation., Disp: 28 each, Rfl: 0   predniSONE  (STERAPRED UNI-PAK 48 TAB) 10 MG (48) TBPK tablet, Take by mouth daily., Disp: 48 tablet, Rfl: 0   rivaroxaban  (XARELTO ) 10 MG TABS tablet, Take 1 tablet (10 mg total) by mouth daily for 28 days., Disp: 28 tablet, Rfl: 0   sertraline  (ZOLOFT ) 50 MG tablet, Take 50 mg by mouth daily., Disp: , Rfl:    tiZANidine  (ZANAFLEX ) 2 MG tablet, Take 2 mg by mouth 2 (two) times daily., Disp: , Rfl:    Vitamin A 2400 MCG (8000 UT) CAPS, Take 2,400 mcg by mouth daily., Disp: , Rfl:    vitamin B-12 (CYANOCOBALAMIN) 50 MCG tablet, Take 50 mcg by mouth daily., Disp: , Rfl:    VITAMIN E PO, Take 1 capsule by mouth daily., Disp: , Rfl:   IMAGING: No results found.   ASSESSMENT AND PLAN:  Possible radicular pain left leg from  degenerative disc disease lumbar spine.  Prior x-rays show facet arthritis at L5-S1 and scoliosis  Straight leg raise positive on the left side negative on the right normal range of motion of the hip  Sensation is normal strength is normal the left lower extremity  MRI for diagnostic purposes and if foraminal stenosis or degenerative disc is seen and we can schedule for epidural injection  No orders of the defined types were placed in this encounter.

## 2024-05-11 NOTE — Progress Notes (Signed)
    05/11/2024   Chief Complaint  Patient presents with   Back Pain    Down left leg    Post-op Follow-up    Hip left    Encounter Diagnosis  Name Primary?   Left displaced femoral neck fracture (HCC) 02/03/24 left hip Bipolar replacement Yes    What pharmacy do you use ? __[facility]_________  DOI/DOS/ Date:    Worse pain down left leg worse

## 2024-05-23 DIAGNOSIS — F411 Generalized anxiety disorder: Secondary | ICD-10-CM | POA: Diagnosis not present

## 2024-05-23 DIAGNOSIS — I10 Essential (primary) hypertension: Secondary | ICD-10-CM | POA: Diagnosis not present

## 2024-05-23 DIAGNOSIS — J9611 Chronic respiratory failure with hypoxia: Secondary | ICD-10-CM | POA: Diagnosis not present

## 2024-05-23 DIAGNOSIS — J440 Chronic obstructive pulmonary disease with acute lower respiratory infection: Secondary | ICD-10-CM | POA: Diagnosis not present

## 2024-06-01 NOTE — CV Procedure (Signed)
  Device system confirmed to be MRI conditional, with implant date > 6 weeks ago, and no evidence of abandoned or epicardial leads in review of most recent CXR  Device last cleared by EP Provider: Daphne Barrack 06/01/24  Clearance is good through for 1 year as long as parameters remain stable at time of check. If pt undergoes a cardiac device procedure during that time, they should be re-cleared.   Tachy-therapies to be programmed off if applicable with device back to pre-MRI settings after completion of exam.  [employer] will be present for programming for the MRI.   Rocky Catalan, RT  06/01/2024 4:04 PM

## 2024-06-08 ENCOUNTER — Ambulatory Visit (HOSPITAL_COMMUNITY)
Admission: RE | Admit: 2024-06-08 | Discharge: 2024-06-08 | Disposition: A | Discharge status: Home / Self Care | Source: Ambulatory Visit | Attending: Orthopedic Surgery | Admitting: Orthopedic Surgery

## 2024-06-08 DIAGNOSIS — M48061 Spinal stenosis, lumbar region without neurogenic claudication: Secondary | ICD-10-CM | POA: Diagnosis not present

## 2024-06-08 DIAGNOSIS — S72002A Fracture of unspecified part of neck of left femur, initial encounter for closed fracture: Secondary | ICD-10-CM | POA: Insufficient documentation

## 2024-06-08 DIAGNOSIS — M4726 Other spondylosis with radiculopathy, lumbar region: Secondary | ICD-10-CM | POA: Diagnosis not present

## 2024-06-08 DIAGNOSIS — M5117 Intervertebral disc disorders with radiculopathy, lumbosacral region: Secondary | ICD-10-CM | POA: Diagnosis not present

## 2024-06-08 DIAGNOSIS — M5410 Radiculopathy, site unspecified: Secondary | ICD-10-CM | POA: Insufficient documentation

## 2024-06-08 DIAGNOSIS — M4807 Spinal stenosis, lumbosacral region: Secondary | ICD-10-CM | POA: Diagnosis not present

## 2024-06-14 ENCOUNTER — Encounter: Payer: Self-pay | Admitting: Cardiology

## 2024-06-14 ENCOUNTER — Ambulatory Visit: Attending: Cardiology | Admitting: Cardiology

## 2024-06-14 VITALS — BP 138/78 | HR 82 | Ht 62.0 in | Wt 120.4 lb

## 2024-06-14 DIAGNOSIS — I495 Sick sinus syndrome: Secondary | ICD-10-CM

## 2024-06-14 DIAGNOSIS — Z79899 Other long term (current) drug therapy: Secondary | ICD-10-CM | POA: Diagnosis not present

## 2024-06-14 DIAGNOSIS — I6522 Occlusion and stenosis of left carotid artery: Secondary | ICD-10-CM | POA: Diagnosis not present

## 2024-06-14 DIAGNOSIS — E782 Mixed hyperlipidemia: Secondary | ICD-10-CM

## 2024-06-14 DIAGNOSIS — I10 Essential (primary) hypertension: Secondary | ICD-10-CM | POA: Diagnosis not present

## 2024-06-14 MED ORDER — ROSUVASTATIN CALCIUM 20 MG PO TABS: 20.0000 mg | ORAL_TABLET | Freq: Every day | ORAL | 3 refills | Status: AC

## 2024-06-14 NOTE — Progress Notes (Signed)
    Cardiology Office Note  Date: 06/14/2024   ID: Raylinn, Kosar 28-Feb-1946, MRN [id number]  History of Present Illness: [name] is a 78 y.o. female last seen in June 2024.  She is here for a follow-up visit.  Reports no chest pain or palpitations, no sudden dizziness or syncope.  She did have a mechanical fall back in July and underwent left bipolar hip replacement with Dr. Margrette.  She is using a walker.  We went over her medications.  Discussed addition of statin therapy in light of mild carotid artery disease evident by Dopplers last year.  Her LDL was 154 in January.  She does take omega-3 supplements in addition to aspirin  81 mg daily.  St[employer] Jude pacemaker in place with follow-up by Dr. Nancey.  Device interrogation in September revealed normal function.  Physical Exam: VS:  BP 138/78 (BP Location: Left Arm)   Pulse 82   Ht 5' 2 (1.575 m)   Wt 120 lb 6.4 oz (54.6 kg)   SpO2 94%   BMI 22.02 kg/m , BMI Body mass index is 22.02 kg/m.  Wt Readings from Last 3 Encounters:  06/14/24 120 lb 6.4 oz (54.6 kg)  02/04/24 116 lb (52.6 kg)  09/10/23 125 lb 9.6 oz (57 kg)    General: Patient appears comfortable at rest. HEENT: Conjunctiva and lids normal. Neck: Supple, no elevated JVP or carotid bruits. Lungs: Clear to auscultation, nonlabored breathing at rest. Cardiac: Regular rate and rhythm, no S3,1/6 systolic murmur. Extremities: No pitting edema.  ECG:  An ECG dated 02/04/2024 was personally reviewed today and demonstrated:  Sinus rhythm with nonspecific T wave changes.  Labwork: January 2025: Cholesterol 226, triglycerides 139, HDL 47, LDL 154 02/06/2024: Hemoglobin 8.7; Platelets 169 02/08/2024: BUN 33; Creatinine, Ser 0.90; Potassium 4.8; Sodium 133   Other Studies Reviewed Today:  No interval cardiac testing for review today.  Assessment and Plan:  1.  Sinus node dysfunction status post [employer] pacemaker.  Device interrogation in September revealed normal  function.  Keep follow-up with Dr. Nancey.   2.  Primary hypertension.  No change in current regimen, continue Benicar 5 mg daily.   3.  Asymptomatic carotid artery disease, less than 50% LICA stenosis by Dopplers in June 2024.  Start Crestor  20 mg daily, baseline LDL was 154 in January.  Recheck FLP and LFTs in 6 months.  Disposition:  Follow up 1 year.  Signed, [name], M.D., F.A.C.C. [facility]

## 2024-06-14 NOTE — Patient Instructions (Signed)
 Medication Instructions:   START Crestor 5 mg daily for cholesterol   Labwork: Fasting Lipids, LFT'S in 6 months (May 2026)  Testing/Procedures: None today  Follow-Up: 1 year Dr.McDowell  Any Other Special Instructions Will Be Listed Below (If Applicable).  If you need a refill on your cardiac medications before your next appointment, please call your pharmacy.

## 2024-06-15 ENCOUNTER — Ambulatory Visit: Admitting: Orthopedic Surgery

## 2024-06-15 ENCOUNTER — Encounter: Payer: Self-pay | Admitting: Orthopedic Surgery

## 2024-06-15 DIAGNOSIS — Z96642 Presence of left artificial hip joint: Secondary | ICD-10-CM | POA: Diagnosis not present

## 2024-06-15 DIAGNOSIS — M5410 Radiculopathy, site unspecified: Secondary | ICD-10-CM | POA: Diagnosis not present

## 2024-06-15 MED ORDER — HYDROCODONE-ACETAMINOPHEN 5-325 MG PO TABS: 1.0000 | ORAL_TABLET | Freq: Four times a day (QID) | ORAL | 0 refills | Status: DC | PRN

## 2024-06-15 NOTE — Progress Notes (Signed)
 FOLLOW-UP OFFICE VISIT   Patient: [name]           Date of Birth: 12/07/1945           MRN: [id number] Visit Date: 06/15/2024 Requested by: Orpha Yancey LABOR, MD [address] [place],  [place] [address] PCP: Orpha Yancey LABOR, MD    Encounter Diagnoses  Name Primary?   Radicular pain of left lower extremity Yes   Status post left hip replacement bipolar replacement     Chief Complaint  Patient presents with   Results    Review MRI scan lumbar spine    Independent interpretation of test performed by another physician: MRI shows facet arthritis    ASSESSMENT AND PLAN 78 year old female follow-up after left displaced femoral neck fracture treated with a bipolar replacement.  On last visit she continued to have pain in her left lower extremity which seem to be radicular in nature rating from her lower back buttock down into her left foot primarily on the medial side of the leg indicating possible L4 root pathology  I am going to send her for ESI L4-5  [name] continues to have pain and requests opioid therapy.  Norco prescribed  Meds ordered this encounter  Medications   HYDROcodone -acetaminophen  (NORCO/VICODIN) 5-325 MG tablet    Sig: Take 1 tablet by mouth every 6 (six) hours as needed for moderate pain (pain score 4-6).    Dispense:  30 tablet    Refill:  0     Addendum: Other possibilities of pain include press-fit stem causing thigh pain but that would not explain the pain that radiates into the foot and lower leg  MRI as reported Disc levels:   L5-S1: Mild disc desiccation and height loss with shallow disc protrusion. Mild bilateral facet arthropathy. No canal stenosis. Mild right foraminal stenosis.   Remaining levels demonstrate no focal disc protrusion. No significant foraminal or canal stenosis at the the remaining lumbar levels.    L5-S1: Mild disc desiccation and height loss with shallow disc protrusion. Mild bilateral facet arthropathy. No canal  stenosis. Mild right foraminal stenosis.   Electronically Signed   By: Mabel Converse D.O.   On: 06/10/2024 11:45

## 2024-06-15 NOTE — Patient Instructions (Signed)
We are referring you to [facility] from [facility] Office address is [address] [place] The phone number is [phone]  The office will call you with an appointment Dr. Newton will do the injection   

## 2024-06-15 NOTE — Progress Notes (Signed)
    06/15/2024   Chief Complaint  Patient presents with   Results    Review MRI scan     No diagnosis found.  What pharmacy do you use ? ________[facility]___  DOI/DOS/ Date:    Did you get better, worse or no change (Answer below)   Unchanged

## 2024-07-04 ENCOUNTER — Ambulatory Visit: Payer: Self-pay

## 2024-07-04 DIAGNOSIS — I495 Sick sinus syndrome: Secondary | ICD-10-CM | POA: Diagnosis not present

## 2024-07-05 LAB — CUP PACEART REMOTE DEVICE CHECK
Battery Remaining Longevity: 82 mo
Battery Remaining Percentage: 64 %
Battery Voltage: 2.99 V
Brady Statistic AP VP Percent: 1 %
Brady Statistic AP VS Percent: 21 %
Brady Statistic AS VP Percent: 1 %
Brady Statistic AS VS Percent: 79 %
Brady Statistic RA Percent Paced: 21 %
Brady Statistic RV Percent Paced: 1 %
Date Time Interrogation Session: 20251209020016
Implantable Lead Connection Status: 753985
Implantable Lead Connection Status: 753985
Implantable Lead Implant Date: 20100908
Implantable Lead Implant Date: 20100908
Implantable Lead Location: 753859
Implantable Lead Location: 753860
Implantable Lead Model: 1944
Implantable Lead Model: 1948
Implantable Pulse Generator Implant Date: 20210914
Lead Channel Impedance Value: 490 Ohm
Lead Channel Impedance Value: 490 Ohm
Lead Channel Pacing Threshold Amplitude: 0.5 V
Lead Channel Pacing Threshold Amplitude: 0.5 V
Lead Channel Pacing Threshold Pulse Width: 0.4 ms
Lead Channel Pacing Threshold Pulse Width: 0.5 ms
Lead Channel Sensing Intrinsic Amplitude: 10.8 mV
Lead Channel Sensing Intrinsic Amplitude: 3.6 mV
Lead Channel Setting Pacing Amplitude: 0.75 V
Lead Channel Setting Pacing Amplitude: 2 V
Lead Channel Setting Pacing Pulse Width: 0.4 ms
Lead Channel Setting Sensing Sensitivity: 2 mV
Pulse Gen Model: 2272
Pulse Gen Serial Number: 3863879

## 2024-07-12 NOTE — Progress Notes (Signed)
 Remote PPM Transmission

## 2024-07-14 ENCOUNTER — Ambulatory Visit: Payer: Self-pay | Admitting: Cardiovascular Disease

## 2024-07-17 ENCOUNTER — Other Ambulatory Visit: Payer: Self-pay

## 2024-07-17 ENCOUNTER — Ambulatory Visit: Admitting: Physical Medicine and Rehabilitation

## 2024-07-17 VITALS — BP 131/78 | HR 49

## 2024-07-17 DIAGNOSIS — M5416 Radiculopathy, lumbar region: Secondary | ICD-10-CM | POA: Diagnosis not present

## 2024-07-17 MED ORDER — METHYLPREDNISOLONE ACETATE 40 MG/ML IJ SUSP
40.0000 mg | Freq: Once | INTRAMUSCULAR | Status: AC
  Administered 2024-07-17: 40 mg

## 2024-07-17 NOTE — Procedures (Signed)
 Lumbosacral Transforaminal Epidural Steroid Injection - Sub-Pedicular Approach with Fluoroscopic Guidance  Patient: [name]      Date of Birth: February 18, 1946 MRN: [id number] PCP: Orpha Yancey LABOR, MD      Visit Date: 07/17/2024   Universal Protocol:    Date/Time: 07/17/2024  Consent Given By: the patient  Position: PRONE  Additional Comments: Vital signs were monitored before and after the procedure. Patient was prepped and draped in the usual sterile fashion. The correct patient, procedure, and site was verified.   Injection Procedure Details:   Procedure diagnoses: Lumbar radiculopathy [M54.16]    Meds Administered:  Meds ordered this encounter  Medications   methylPREDNISolone  acetate (DEPO-MEDROL ) injection 40 mg    Laterality: Left  Location/Site: L4  Needle:5.0 in., 22 ga.  Short bevel or Quincke spinal needle  Needle Placement: Transforaminal  Findings:    -Comments: Excellent flow of contrast along the nerve, nerve root and into the epidural space.  Procedure Details: After squaring off the end-plates to get a true AP view, the C-arm was positioned so that an oblique view of the foramen as noted above was visualized. The target area is just inferior to the nose of the scotty dog or sub pedicular. The soft tissues overlying this structure were infiltrated with 2-3 ml. of 1% Lidocaine  without Epinephrine .  The spinal needle was inserted toward the target using a trajectory view along the fluoroscope beam.  Under AP and lateral visualization, the needle was advanced so it did not puncture dura and was located close the 6 O'Clock position of the pedical in AP tracterory. Biplanar projections were used to confirm position. Aspiration was confirmed to be negative for CSF and/or blood. A 1-2 ml. volume of Isovue-250 was injected and flow of contrast was noted at each level. Radiographs were obtained for documentation purposes.   After attaining the desired flow of  contrast documented above, a 0.5 to 1.0 ml test dose of 0.25% Marcaine  was injected into each respective transforaminal space.  The patient was observed for 90 seconds post injection.  After no sensory deficits were reported, and normal lower extremity motor function was noted,   the above injectate was administered so that equal amounts of the injectate were placed at each foramen (level) into the transforaminal epidural space.   Additional Comments:  The patient tolerated the procedure well Dressing: 2 x 2 sterile gauze and Band-Aid    Post-procedure details: Patient was observed during the procedure. Post-procedure instructions were reviewed.  Patient left the clinic in stable condition.

## 2024-07-17 NOTE — Progress Notes (Signed)
 "  [name] - 78 y.o. female MRN [id number]  Date of birth: 06-Nov-1945  Office Visit Note: Visit Date: 07/17/2024 PCP: Orpha Yancey LABOR, MD Referred by: Orpha Yancey LABOR, MD  Subjective: Chief Complaint  Patient presents with   Lower Back - Pain   HPI:  [name] is a 78 y.o. female who comes in today at the request of Dr. Taft Minerva for planned Left L4-5 Lumbar Transforaminal epidural steroid injection with fluoroscopic guidance.  The patient has failed conservative care including home exercise, medications, time and activity modification.  This injection will be diagnostic and hopefully therapeutic.  Please see requesting physician notes for further details and justification.  MRI of the lumbar spine fairly normal for age.   ROS Otherwise per HPI.  Assessment & Plan: Visit Diagnoses:    ICD-10-CM   1. Lumbar radiculopathy  M54.16 XR C-ARM NO REPORT    Epidural Steroid injection    methylPREDNISolone  acetate (DEPO-MEDROL ) injection 40 mg      Plan: No additional findings.   Meds & Orders:  Meds ordered this encounter  Medications   methylPREDNISolone  acetate (DEPO-MEDROL ) injection 40 mg    Orders Placed This Encounter  Procedures   XR C-ARM NO REPORT   Epidural Steroid injection    Follow-up: Return for visit to requesting provider as needed.   Procedures: No procedures performed  Lumbosacral Transforaminal Epidural Steroid Injection - Sub-Pedicular Approach with Fluoroscopic Guidance  Patient: [name]      Date of Birth: 25-Oct-1945 MRN: [id number] PCP: Orpha Yancey LABOR, MD      Visit Date: 07/17/2024   Universal Protocol:    Date/Time: 07/17/2024  Consent Given By: the patient  Position: PRONE  Additional Comments: Vital signs were monitored before and after the procedure. Patient was prepped and draped in the usual sterile fashion. The correct patient, procedure, and site was verified.   Injection Procedure Details:   Procedure  diagnoses: Lumbar radiculopathy [M54.16]    Meds Administered:  Meds ordered this encounter  Medications   methylPREDNISolone  acetate (DEPO-MEDROL ) injection 40 mg    Laterality: Left  Location/Site: L4  Needle:5.0 in., 22 ga.  Short bevel or Quincke spinal needle  Needle Placement: Transforaminal  Findings:    -Comments: Excellent flow of contrast along the nerve, nerve root and into the epidural space.  Procedure Details: After squaring off the end-plates to get a true AP view, the C-arm was positioned so that an oblique view of the foramen as noted above was visualized. The target area is just inferior to the nose of the scotty dog or sub pedicular. The soft tissues overlying this structure were infiltrated with 2-3 ml. of 1% Lidocaine  without Epinephrine .  The spinal needle was inserted toward the target using a trajectory view along the fluoroscope beam.  Under AP and lateral visualization, the needle was advanced so it did not puncture dura and was located close the 6 O'Clock position of the pedical in AP tracterory. Biplanar projections were used to confirm position. Aspiration was confirmed to be negative for CSF and/or blood. A 1-2 ml. volume of Isovue-250 was injected and flow of contrast was noted at each level. Radiographs were obtained for documentation purposes.   After attaining the desired flow of contrast documented above, a 0.5 to 1.0 ml test dose of 0.25% Marcaine  was injected into each respective transforaminal space.  The patient was observed for 90 seconds post injection.  After no sensory deficits were reported, and normal lower extremity motor  function was noted,   the above injectate was administered so that equal amounts of the injectate were placed at each foramen (level) into the transforaminal epidural space.   Additional Comments:  The patient tolerated the procedure well Dressing: 2 x 2 sterile gauze and Band-Aid    Post-procedure details: Patient  was observed during the procedure. Post-procedure instructions were reviewed.  Patient left the clinic in stable condition.    Clinical History: MRI LUMBAR SPINE WITHOUT CONTRAST   TECHNIQUE: Multiplanar, multisequence MR imaging of the lumbar spine was performed. No intravenous contrast was administered.   COMPARISON:  X-ray 03/30/2024   FINDINGS: Segmentation:  Standard.   Alignment:  Physiologic.   Vertebrae:  No fracture, evidence of discitis, or bone lesion.   Conus medullaris and cauda equina: Conus extends to the L1 level. Conus and cauda equina appear normal.   Paraspinal and other soft tissues: Negative.   Disc levels:   L5-S1: Mild disc desiccation and height loss with shallow disc protrusion. Mild bilateral facet arthropathy. No canal stenosis. Mild right foraminal stenosis.   Remaining levels demonstrate no focal disc protrusion. No significant foraminal or canal stenosis at the the remaining lumbar levels.   IMPRESSION: 1. Mild degenerative disc disease at L5-S1 with mild right foraminal stenosis. 2. No canal stenosis at any level.     Electronically Signed   By: Mabel Converse D.O.   On: 06/10/2024 11:45     Objective:  VS:  HT:    WT:   BMI:     BP:131/78  HR:(!) 49bpm  TEMP: ( )  RESP:  Physical Exam Vitals and nursing note reviewed.  Constitutional:      General: She is not in acute distress.    Appearance: Normal appearance. She is not ill-appearing.  HENT:     Head: Normocephalic and atraumatic.     Right Ear: External ear normal.     Left Ear: External ear normal.  Eyes:     Extraocular Movements: Extraocular movements intact.  Cardiovascular:     Rate and Rhythm: Normal rate.     Pulses: Normal pulses.  Pulmonary:     Effort: Pulmonary effort is normal. No respiratory distress.  Abdominal:     General: There is no distension.     Palpations: Abdomen is soft.  Musculoskeletal:        General: Tenderness present.      Cervical back: Neck supple.     Right lower leg: No edema.     Left lower leg: No edema.     Comments: Patient has good distal strength with no pain over the greater trochanters.  No clonus or focal weakness.  Skin:    Findings: No erythema, lesion or rash.  Neurological:     General: No focal deficit present.     Mental Status: She is alert and oriented to person, place, and time.     Cranial Nerves: No cranial nerve deficit.     Sensory: No sensory deficit.     Motor: No weakness or abnormal muscle tone.     Coordination: Coordination normal.     Gait: Gait abnormal.  Psychiatric:        Mood and Affect: Mood normal.        Behavior: Behavior normal.      Imaging: No results found. "

## 2024-07-17 NOTE — Progress Notes (Signed)
 Pain Scale   Average Pain 9 Patient advising she has chronic lower back pain radiating to left leg when standing and walking.        +Driver, -BT, -Dye Allergies.

## 2024-08-09 ENCOUNTER — Telehealth: Payer: Self-pay | Admitting: Orthopedic Surgery

## 2024-08-09 NOTE — Telephone Encounter (Signed)
 Dr. Areatha pt - pt lvm requesting something for pain, she stated her leg is hurting really bad.  [phone]

## 2024-08-09 NOTE — Telephone Encounter (Signed)
 I called her to see what she is taking, she said Dr Margrette gave her Hydrocodone  but it doesn't help I asked her about Gabapentin  she said it doesn't help either and she is out of them, she is also not taking the Ibuprofen   She said nothing helps

## 2024-08-10 ENCOUNTER — Other Ambulatory Visit: Payer: Self-pay | Admitting: Orthopedic Surgery

## 2024-08-10 DIAGNOSIS — M5410 Radiculopathy, site unspecified: Secondary | ICD-10-CM

## 2024-08-10 MED ORDER — PREDNISONE 10 MG (48) PO TBPK: ORAL_TABLET | Freq: Every day | ORAL | 0 refills | Status: AC

## 2024-08-17 ENCOUNTER — Ambulatory Visit: Admitting: Orthopedic Surgery

## 2024-08-17 ENCOUNTER — Encounter: Payer: Self-pay | Admitting: Orthopedic Surgery

## 2024-08-17 DIAGNOSIS — S72002A Fracture of unspecified part of neck of left femur, initial encounter for closed fracture: Secondary | ICD-10-CM

## 2024-08-17 DIAGNOSIS — M5410 Radiculopathy, site unspecified: Secondary | ICD-10-CM | POA: Diagnosis not present

## 2024-08-17 MED ORDER — HYDROCODONE-ACETAMINOPHEN 7.5-325 MG PO TABS: 1.0000 | ORAL_TABLET | ORAL | 0 refills | Status: AC | PRN

## 2024-08-17 NOTE — Addendum Note (Signed)
 Addended byBETHA JENEAN GREIG LELON on: 08/17/2024 02:42 PM   Modules accepted: Orders

## 2024-08-17 NOTE — Progress Notes (Signed)
 FOLLOW-UP OFFICE VISIT   Patient: [name]           Date of Birth: 10-01-1945           MRN: [id number] Visit Date: 08/17/2024 Requested by: Orpha Yancey LABOR, MD [address] [place],  [place] [address] PCP: Orpha Yancey LABOR, MD    Encounter Diagnoses  Name Primary?   Left displaced femoral neck fracture (HCC) 02/03/24 left hip Bipolar replacement    Radicular pain of left lower extremity Yes    Chief Complaint  Patient presents with   Leg Pain    Left leg pain     [name] is 79 years old she had a left femoral neck fracture treated with a partial bipolar hip replacement.  After surgery she complained of lower back left hip and left leg pain radiating to her foot.  We tried some oral medication including steroids as well as hydrocodone  5 mg and 1 epidural injection at L4 and she did not get any relief  Confirmation of her symptoms today included tenderness in her lower back left buttock left lateral leg down into the anterolateral compartment of the leg  ASSESSMENT AND PLAN  Encounter Diagnoses  Name Primary?   Left displaced femoral neck fracture (HCC) 02/03/24 left hip Bipolar replacement    Radicular pain of left lower extremity Yes    I discussed with her some treatment options which included repeat epidural, further medical management, pain management,  She says that she got good relief from one of her daughters hydrocodone  7.5 so we are going to send her to the pain clinic to be evaluated for chronic pain management  RE CK 2 MOS  Meds ordered this encounter  Medications   HYDROcodone -acetaminophen  (NORCO) 7.5-325 MG tablet    Sig: Take 1 tablet by mouth every 4 (four) hours as needed for up to 5 days for moderate pain (pain score 4-6).    Dispense:  30 tablet    Refill:  0

## 2024-08-17 NOTE — Progress Notes (Signed)
" ° ° °  08/17/2024   Chief Complaint  Patient presents with   Leg Pain    Left leg pain     Encounter Diagnoses  Name Primary?   Left displaced femoral neck fracture (HCC) 02/03/24 left hip Bipolar replacement Yes   Radicular pain of left lower extremity     What pharmacy do you use ? _____[facility]______  DOI/DOS/ Date:    Did you get better, worse or no change (Answer below)   Unchanged no better with ESI that was done on 07/17/24      "

## 2024-08-17 NOTE — Patient Instructions (Signed)
 [facility] [address]  [phone]  We will send referral there, you call next week to schedule, they will call you too.

## 2024-08-31 ENCOUNTER — Telehealth: Payer: Self-pay | Admitting: Orthopedic Surgery

## 2024-08-31 NOTE — Telephone Encounter (Signed)
 I have faxed to the fax number she provided, which is a different number than we normally use.

## 2024-08-31 NOTE — Telephone Encounter (Signed)
 Dr. Areatha pt - spoke w/the pt, she stated that she was referred to pain management and they are telling her they do not have the referral.  She stated it needs to be faxed to [phone].

## 2024-10-16 ENCOUNTER — Ambulatory Visit: Admitting: Orthopedic Surgery
